# Patient Record
Sex: Male | Born: 1960 | Race: White | Hispanic: No | Marital: Married | Smoking: Former smoker
Health system: Southern US, Community
[De-identification: ages and names within clinical notes are randomized; demographics above are authoritative.]

## PROBLEM LIST (undated history)

## (undated) DIAGNOSIS — E663 Overweight: Secondary | ICD-10-CM

## (undated) DIAGNOSIS — I340 Nonrheumatic mitral (valve) insufficiency: Secondary | ICD-10-CM

## (undated) DIAGNOSIS — E785 Hyperlipidemia, unspecified: Secondary | ICD-10-CM

## (undated) DIAGNOSIS — I639 Cerebral infarction, unspecified: Secondary | ICD-10-CM

## (undated) DIAGNOSIS — I10 Essential (primary) hypertension: Secondary | ICD-10-CM

## (undated) DIAGNOSIS — I4891 Unspecified atrial fibrillation: Principal | ICD-10-CM

## (undated) DIAGNOSIS — S6720XA Crushing injury of unspecified hand, initial encounter: Secondary | ICD-10-CM

## (undated) DIAGNOSIS — J302 Other seasonal allergic rhinitis: Secondary | ICD-10-CM

## (undated) DIAGNOSIS — I429 Cardiomyopathy, unspecified: Secondary | ICD-10-CM

## (undated) DIAGNOSIS — M1990 Unspecified osteoarthritis, unspecified site: Secondary | ICD-10-CM

## (undated) DIAGNOSIS — R945 Abnormal results of liver function studies: Secondary | ICD-10-CM

## (undated) DIAGNOSIS — M7701 Medial epicondylitis, right elbow: Secondary | ICD-10-CM

## (undated) DIAGNOSIS — M79645 Pain in left finger(s): Secondary | ICD-10-CM

## (undated) DIAGNOSIS — K219 Gastro-esophageal reflux disease without esophagitis: Secondary | ICD-10-CM

## (undated) DIAGNOSIS — Z0000 Encounter for general adult medical examination without abnormal findings: Secondary | ICD-10-CM

## (undated) DIAGNOSIS — J449 Chronic obstructive pulmonary disease, unspecified: Secondary | ICD-10-CM

## (undated) DIAGNOSIS — F1010 Alcohol abuse, uncomplicated: Secondary | ICD-10-CM

## (undated) DIAGNOSIS — K429 Umbilical hernia without obstruction or gangrene: Secondary | ICD-10-CM

## (undated) HISTORY — DX: Cerebral infarction, unspecified: I63.9

## (undated) HISTORY — DX: Hyperlipidemia, unspecified: E78.5

## (undated) HISTORY — DX: Chronic obstructive pulmonary disease, unspecified: J44.9

## (undated) HISTORY — PX: VASECTOMY: SHX75

## (undated) HISTORY — DX: Encounter for general adult medical examination without abnormal findings: Z00.00

## (undated) HISTORY — DX: Nonrheumatic mitral (valve) insufficiency: I34.0

## (undated) HISTORY — DX: Gastro-esophageal reflux disease without esophagitis: K21.9

## (undated) HISTORY — DX: Abnormal results of liver function studies: R94.5

## (undated) HISTORY — DX: Overweight: E66.3

## (undated) HISTORY — DX: Crushing injury of unspecified hand, initial encounter: S67.20XA

## (undated) HISTORY — DX: Essential (primary) hypertension: I10

## (undated) HISTORY — DX: Medial epicondylitis, right elbow: M77.01

## (undated) HISTORY — PX: CARPAL TUNNEL RELEASE: SHX101

## (undated) HISTORY — DX: Unspecified atrial fibrillation: I48.91

## (undated) HISTORY — DX: Pain in left finger(s): M79.645

## (undated) HISTORY — DX: Umbilical hernia without obstruction or gangrene: K42.9

## (undated) HISTORY — DX: Unspecified osteoarthritis, unspecified site: M19.90

## (undated) HISTORY — PX: OTHER SURGICAL HISTORY: SHX169

---

## 2008-12-28 ENCOUNTER — Encounter: Admission: RE | Admit: 2008-12-28 | Discharge: 2008-12-28 | Payer: Self-pay | Admitting: Family Medicine

## 2011-04-16 ENCOUNTER — Ambulatory Visit (INDEPENDENT_AMBULATORY_CARE_PROVIDER_SITE_OTHER): Payer: Self-pay | Admitting: Family Medicine

## 2011-04-16 ENCOUNTER — Encounter: Payer: Self-pay | Admitting: Family Medicine

## 2011-04-16 DIAGNOSIS — M129 Arthropathy, unspecified: Secondary | ICD-10-CM

## 2011-04-16 DIAGNOSIS — M1990 Unspecified osteoarthritis, unspecified site: Secondary | ICD-10-CM

## 2011-04-16 DIAGNOSIS — F17200 Nicotine dependence, unspecified, uncomplicated: Secondary | ICD-10-CM

## 2011-04-16 DIAGNOSIS — E663 Overweight: Secondary | ICD-10-CM

## 2011-04-16 DIAGNOSIS — I10 Essential (primary) hypertension: Secondary | ICD-10-CM

## 2011-04-16 DIAGNOSIS — J209 Acute bronchitis, unspecified: Secondary | ICD-10-CM

## 2011-04-16 DIAGNOSIS — E559 Vitamin D deficiency, unspecified: Secondary | ICD-10-CM

## 2011-04-16 DIAGNOSIS — Z720 Tobacco use: Secondary | ICD-10-CM

## 2011-04-16 DIAGNOSIS — K219 Gastro-esophageal reflux disease without esophagitis: Secondary | ICD-10-CM

## 2011-04-16 MED ORDER — RANITIDINE HCL 300 MG PO CAPS: 300.0000 mg | ORAL_CAPSULE | Freq: Every day | ORAL | Status: DC

## 2011-04-16 MED ORDER — LOSARTAN POTASSIUM 50 MG PO TABS: 50.0000 mg | ORAL_TABLET | Freq: Every day | ORAL | Status: DC

## 2011-04-16 MED ORDER — AZITHROMYCIN 250 MG PO TABS: 250.0000 mg | ORAL_TABLET | ORAL | Status: DC

## 2011-04-16 MED ORDER — OMEPRAZOLE 40 MG PO CPDR: 40.0000 mg | DELAYED_RELEASE_CAPSULE | Freq: Every day | ORAL | Status: DC

## 2011-04-16 MED ORDER — ALBUTEROL SULFATE (2.5 MG/3ML) 0.083% IN NEBU: 2.5000 mg | INHALATION_SOLUTION | Freq: Four times a day (QID) | RESPIRATORY_TRACT | Status: DC | PRN

## 2011-04-16 MED ORDER — NIACIN 500 MG PO CPCR: 500.0000 mg | ORAL_CAPSULE | Freq: Every day | ORAL | Status: DC

## 2011-04-16 MED ORDER — ALBUTEROL SULFATE HFA 108 (90 BASE) MCG/ACT IN AERS: 2.0000 | INHALATION_SPRAY | RESPIRATORY_TRACT | Status: DC | PRN

## 2011-04-16 MED ORDER — NAPROXEN-ESOMEPRAZOLE 500-20 MG PO TBEC: 1.0000 | DELAYED_RELEASE_TABLET | Freq: Every day | ORAL | Status: DC | PRN

## 2011-04-17 ENCOUNTER — Encounter: Payer: Self-pay | Admitting: Family Medicine

## 2011-04-17 DIAGNOSIS — I10 Essential (primary) hypertension: Secondary | ICD-10-CM | POA: Insufficient documentation

## 2011-04-17 DIAGNOSIS — F17201 Nicotine dependence, unspecified, in remission: Secondary | ICD-10-CM | POA: Insufficient documentation

## 2011-04-17 DIAGNOSIS — J209 Acute bronchitis, unspecified: Secondary | ICD-10-CM | POA: Insufficient documentation

## 2011-04-17 DIAGNOSIS — M1990 Unspecified osteoarthritis, unspecified site: Secondary | ICD-10-CM | POA: Insufficient documentation

## 2011-04-17 DIAGNOSIS — E663 Overweight: Secondary | ICD-10-CM | POA: Insufficient documentation

## 2011-04-17 DIAGNOSIS — E559 Vitamin D deficiency, unspecified: Secondary | ICD-10-CM | POA: Insufficient documentation

## 2011-04-17 NOTE — Assessment & Plan Note (Signed)
Patient has cut down on his Vimovo but if he skips his dose for greater than 3 days the reflux returns. Encouraged him to try Ranitidine first and if no response then add Omeprazole and avoid offending foods

## 2011-04-17 NOTE — Assessment & Plan Note (Signed)
Had been taking Vimovo with Naproxen but actually reports his right arm pain is better and his knee is tolerable, is encouraged to stop the Naproxen portion for now and to try Aspercreme topically prn, may return to Naproxen as needed with food.

## 2011-04-17 NOTE — Assessment & Plan Note (Signed)
Azithromycin is prescribed and patient will continue inhaled meds and check spirometry at next visit to establish his respiratory status
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
## 2011-04-17 NOTE — Assessment & Plan Note (Signed)
Patient encouraged complete cessation and educated regarding risk of continuing even one cigarette a day

## 2011-04-17 NOTE — Assessment & Plan Note (Signed)
Well treated on current meds, no changes

## 2011-04-17 NOTE — Assessment & Plan Note (Signed)
Avoid trans fats, increase activity levels, eat small, frequent meals

## 2011-05-07 ENCOUNTER — Telehealth: Payer: Self-pay | Admitting: Family Medicine

## 2011-05-10 ENCOUNTER — Telehealth: Payer: Self-pay | Admitting: Family Medicine

## 2011-05-10 MED ORDER — CIPROFLOXACIN HCL 500 MG PO TABS: 500.0000 mg | ORAL_TABLET | Freq: Two times a day (BID) | ORAL | Status: DC

## 2011-05-10 NOTE — Telephone Encounter (Signed)
MD gave pt a sample of flovent for pts wife to pick up

## 2011-05-10 NOTE — Telephone Encounter (Signed)
Patient is needing Flovent refill . Can he take something else other than the Z pack. Reports are saying he shouldn't take Z pack because of his blood pressure.

## 2011-05-10 NOTE — Telephone Encounter (Signed)
So he needs the Ventolin to use prn and then if he needs Flovent what dose did he take and how many puffs

## 2011-05-10 NOTE — Telephone Encounter (Signed)
OK to refill flovent with same sig. He can have Ciprofloxacin 500mg  po bid x 7 days, the reports on Zpack have more to do with abnormal rhythms than BP as I follow them but I do not object to him switching if he would like.

## 2011-05-16 NOTE — Telephone Encounter (Signed)
Phone note in error °

## 2011-06-07 ENCOUNTER — Ambulatory Visit: Payer: PRIVATE HEALTH INSURANCE | Admitting: Family Medicine

## 2011-06-14 ENCOUNTER — Ambulatory Visit: Payer: PRIVATE HEALTH INSURANCE | Admitting: Family Medicine

## 2011-06-14 ENCOUNTER — Encounter: Payer: PRIVATE HEALTH INSURANCE | Admitting: Family Medicine

## 2011-06-19 ENCOUNTER — Telehealth: Payer: Self-pay | Admitting: Family Medicine

## 2011-06-19 NOTE — Telephone Encounter (Signed)
Entered in error

## 2011-06-20 ENCOUNTER — Ambulatory Visit (INDEPENDENT_AMBULATORY_CARE_PROVIDER_SITE_OTHER): Payer: PRIVATE HEALTH INSURANCE | Admitting: Family Medicine

## 2011-06-20 VITALS — BP 122/98 | HR 76 | Temp 97.8°F | Ht 75.75 in

## 2011-06-20 DIAGNOSIS — M79609 Pain in unspecified limb: Secondary | ICD-10-CM

## 2011-06-21 NOTE — Progress Notes (Signed)
Patient left without being seen by doctor, secondary to he had an orthopaedic appt already scheduled for 6/28

## 2011-06-24 ENCOUNTER — Telehealth: Payer: Self-pay | Admitting: *Deleted

## 2011-06-24 ENCOUNTER — Other Ambulatory Visit: Payer: Self-pay | Admitting: Family Medicine

## 2011-06-24 MED ORDER — HYDROCODONE-ACETAMINOPHEN 5-500 MG PO TABS: ORAL_TABLET | ORAL | Status: DC

## 2011-06-24 NOTE — Telephone Encounter (Signed)
RC to pt.  Advised RX has been sent to pharm.  Pt states UC was in [facility] (?[facility]).  He does not remember the name of the orthopedist he saw in [place].  He will have his wife call us with that information.

## 2011-06-24 NOTE — Telephone Encounter (Signed)
Please refer to orthopedist. Get as much info on the UC where he was seen to old records can be obtained. I'll do rx for #30 vicodin 5/500, no RF.  --PM

## 2011-06-24 NOTE — Telephone Encounter (Signed)
Pt broke hand on 6/24.  Pt has seen orthopedist and was not casted, but allowed to continue wearing brace.  Pt was originally seen by UC (out of town) and given Vicodin 5-500.  Pt is taking about 2 1/2 tabs daily, usually cutting tab in half.  Pt is also taking Ibuprofen 200mg , 2 q 4 hours for pain and inflammation.  Pt was told by ortho to follow up with ortho or PCP.  Pt is back to work this week on light duty.  He is out of town today. Pt would like refill on vicodin.  Do I need to refer him back to orthopedist?

## 2011-06-28 ENCOUNTER — Other Ambulatory Visit: Payer: Self-pay | Admitting: *Deleted

## 2011-06-28 DIAGNOSIS — I10 Essential (primary) hypertension: Secondary | ICD-10-CM

## 2011-06-28 MED ORDER — LOSARTAN POTASSIUM 50 MG PO TABS: 50.0000 mg | ORAL_TABLET | Freq: Every day | ORAL | Status: DC

## 2011-06-28 NOTE — Telephone Encounter (Signed)
Faxed request received for refill.  30 day supply sent.  Pt has CPE on 07/05/11

## 2011-07-04 ENCOUNTER — Encounter: Payer: Self-pay | Admitting: Family Medicine

## 2011-07-05 ENCOUNTER — Encounter: Payer: Self-pay | Admitting: Family Medicine

## 2011-07-05 ENCOUNTER — Ambulatory Visit (INDEPENDENT_AMBULATORY_CARE_PROVIDER_SITE_OTHER): Payer: PRIVATE HEALTH INSURANCE | Admitting: Family Medicine

## 2011-07-05 VITALS — BP 145/88 | HR 87 | Temp 98.3°F | Ht 75.75 in | Wt 226.8 lb

## 2011-07-05 DIAGNOSIS — S66912A Strain of unspecified muscle, fascia and tendon at wrist and hand level, left hand, initial encounter: Secondary | ICD-10-CM

## 2011-07-05 DIAGNOSIS — K429 Umbilical hernia without obstruction or gangrene: Secondary | ICD-10-CM | POA: Insufficient documentation

## 2011-07-05 DIAGNOSIS — S6720XA Crushing injury of unspecified hand, initial encounter: Secondary | ICD-10-CM

## 2011-07-05 DIAGNOSIS — S6390XA Sprain of unspecified part of unspecified wrist and hand, initial encounter: Secondary | ICD-10-CM

## 2011-07-05 DIAGNOSIS — I10 Essential (primary) hypertension: Secondary | ICD-10-CM

## 2011-07-05 DIAGNOSIS — J209 Acute bronchitis, unspecified: Secondary | ICD-10-CM

## 2011-07-05 DIAGNOSIS — Z720 Tobacco use: Secondary | ICD-10-CM

## 2011-07-05 DIAGNOSIS — K219 Gastro-esophageal reflux disease without esophagitis: Secondary | ICD-10-CM

## 2011-07-05 DIAGNOSIS — F17200 Nicotine dependence, unspecified, uncomplicated: Secondary | ICD-10-CM

## 2011-07-05 HISTORY — DX: Crushing injury of unspecified hand, initial encounter: S67.20XA

## 2011-07-05 HISTORY — DX: Umbilical hernia without obstruction or gangrene: K42.9

## 2011-07-05 MED ORDER — HYDROCODONE-ACETAMINOPHEN 5-325 MG PO TABS: 1.0000 | ORAL_TABLET | Freq: Three times a day (TID) | ORAL | Status: DC | PRN

## 2011-07-05 MED ORDER — ESOMEPRAZOLE MAGNESIUM 40 MG PO CPDR: 40.0000 mg | DELAYED_RELEASE_CAPSULE | Freq: Every day | ORAL | Status: DC

## 2011-07-05 NOTE — Patient Instructions (Signed)
Hand Injuries You have an injured hand.  Minor fractures, sprains, bruises and burns of the hand are often managed in much the same way. Treatment includes:  Keep your hand elevated above the level of your heart for the next few days until the pain and swelling improve.   Hand dressings and splints are used to reduce motion, relieve pain and prevent re-injury. Do not remove your dressing and splint until your doctor approves.   Apply ice packs for 20-30 minutes every few hours for 2-3 days to reduce pain and swelling due to fractures, sprains, and deep bruises.   Medicine to reduce pain and inflammation is often helpful.  Early motion exercises are sometimes needed to reduce joint stiffness after a hand injury; however, do not use your hand for any activities that increase pain.  Please see your doctor for follow-up care as advised. Document Released: 01/16/2005 Document Re-Released: 03/05/2010 [facility] Patient Information 2011 [place], [place].   Should remain light duty til seen again, next Friday  May continue to ice and try Aspercreme to area up to 3 x day.

## 2011-07-05 NOTE — Assessment & Plan Note (Signed)
Patient is in today for follow up and did not take his antihypertensive today. He agrees to take his medication prior to his visit next week so we can assess its efficacy

## 2011-07-05 NOTE — Progress Notes (Signed)
Marc Hansen [id number] 08-19-1961 07/05/2011      Progress Note-Follow Up  Subjective  Chief Complaint  Chief Complaint  Patient presents with  . Annual Exam  . Hernia    hiatal hernia- above belly button X 2 years    HPI  Patient is a 50 year old Caucasian male in today for followup. He suffered a crush injury to his left hand in late June while working with a heavy mail it in his garden. Initially when seen in urgent care he was told it was a hairline fracture but on review of the x-ray he was told in fact it was more of a crush injury with a sprain and possible nerve damage. Initially the pain was 10 out of 10 but is now dropped to 5/10. The swelling is decreased. He is wearing a splint regularly and finds that helpful. He does ice it regularly also finds that somewhat helpful. He is presently maintained on light duty secondary to having a very strenuous job. He believes he needs at least another week of light duty due to the heavy duty nature of his job but is slowly improving. He was concern of an umbilical hernia that he's had for years. This is reducible on and is not growing. No constipation, nausea vomiting, anorexia, diarrhea or other GI complaints. No recent febrile illness. His cough is still present but greatly improved. Is not having any shortness of breath or wheezing. He never did take his last course of antibiotics due to financial concerns and improved symptoms. No significant congestion. His heartburn is well controlled on Nexium products but does not respond to Prilosec or H2 blockers. He is requesting a small refill on his pain medications as ischemic hand continues to heal  Past Medical History  Diagnosis Date  . Allergy     seasonal  . GERD (gastroesophageal reflux disease)   . Hypertension   . Overweight 04/17/2011  . Arthritis 04/17/2011  . Reflux 04/17/2011  . HTN (hypertension) 04/17/2011  . Tobacco abuse 04/17/2011  . Acute bronchitis 04/17/2011  . Vitamin D  deficiency 04/17/2011  . Hand crush injury 07/05/2011  . Umbilical hernia 07/05/2011    Past Surgical History  Procedure Date  . Vasectomy   . Knee surgery   . Carpal tunnel release     right, needed some nerve repair as well  . Eye surgery     left for detachmenet  . Right knee arthroscopy     Family History  Problem Relation Age of Onset  . Cancer Mother 16    breast- remission  . Fibromyalgia Mother   . Other Father     CHF- stent   . Heart attack Father   . Heart disease Maternal Uncle   . Heart disease Maternal Grandmother   . Heart disease Maternal Grandfather     History   Social History  . Marital Status: Married    Spouse Name: N/A    Number of Children: N/A  . Years of Education: N/A   Occupational History  . Not on file.   Social History Main Topics  . Smoking status: Current Everyday Smoker -- 0.3 packs/day    Types: Cigarettes  . Smokeless tobacco: Never Used  . Alcohol Use: Yes     24 beers weekly  . Drug Use: No  . Sexually Active: Yes -- Male partner(s)   Other Topics Concern  . Not on file   Social History Narrative  . No narrative on file  Current Outpatient Prescriptions on File Prior to Visit  Medication Sig Dispense Refill  . albuterol (PROVENTIL HFA;VENTOLIN HFA) 108 (90 BASE) MCG/ACT inhaler Inhale 2 puffs into the lungs as needed.  1 Inhaler  2  . albuterol (PROVENTIL) (2.5 MG/3ML) 0.083% nebulizer solution Take 3 mLs (2.5 mg total) by nebulization every 6 (six) hours as needed.  100 vial  2  . fish oil-omega-3 fatty acids 1000 MG capsule Take 2 g by mouth 2 (two) times daily.        . Fluticasone Propionate, Inhal, (FLOVENT DISKUS) 100 MCG/BLIST AEPB Inhale into the lungs.        Marland Kitchen ibuprofen (ADVIL,MOTRIN) 200 MG tablet Take 400 mg by mouth every 6 (six) hours as needed.        Marland Kitchen losartan (COZAAR) 50 MG tablet Take 1 tablet (50 mg total) by mouth daily.  30 tablet  0  . multivitamin (THERAGRAN) per tablet Take 1 tablet by mouth  daily.        . Naproxen-Esomeprazole (VIMOVO) 500-20 MG TBEC Take 1 tablet by mouth daily as needed.  30 tablet  2  . Nebulizer MISC by Does not apply route.        . niacin 500 MG CR capsule Take 1 capsule (500 mg total) by mouth daily. Take bid   30 capsule  2  . omeprazole (PRILOSEC) 40 MG capsule Take 1 capsule (40 mg total) by mouth daily. May alternate with ranitidine  30 capsule  2  . ranitidine (ZANTAC) 300 MG capsule Take 1 capsule (300 mg total) by mouth daily.  30 capsule  2  . Red Yeast Rice Extract (RED YEAST RICE PO) Take 2 tablets by mouth 2 (two) times daily.        Marland Kitchen VITAMIN D, ERGOCALCIFEROL, PO Take 1,200 mg by mouth daily.          No Known Allergies  Review of Systems  Review of Systems  Constitutional: Negative for fever and malaise/fatigue.  HENT: Negative for congestion.   Eyes: Negative for discharge.  Respiratory: Positive for cough. Negative for shortness of breath.   Cardiovascular: Negative for chest pain, palpitations and leg swelling.  Gastrointestinal: Positive for heartburn. Negative for nausea, abdominal pain, diarrhea, blood in stool and melena.  Genitourinary: Negative for dysuria.  Musculoskeletal: Positive for joint pain. Negative for myalgias and falls.       Left hand tender at  Base of thumb, mildly swollen  Skin: Negative for rash.  Neurological: Negative for loss of consciousness and headaches.  Endo/Heme/Allergies: Negative for polydipsia.  Psychiatric/Behavioral: Negative for depression and suicidal ideas. The patient is not nervous/anxious and does not have insomnia.     Objective  BP 145/88  Pulse 87  Temp(Src) 98.3 F (36.8 C) (Oral)  Ht 6' 3.75" (1.924 m)  Wt 226 lb 12.8 oz (102.876 kg)  BMI 27.79 kg/m2  SpO2 97%  Physical Exam  Physical Exam  Constitutional: He is oriented to person, place, and time and well-developed, well-nourished, and in no distress. No distress.  HENT:  Head: Normocephalic and atraumatic.  Eyes:  Conjunctivae are normal.  Neck: Neck supple. No thyromegaly present.  Cardiovascular: Normal rate, regular rhythm and normal heart sounds.   No murmur heard. Pulmonary/Chest: Effort normal and breath sounds normal. No respiratory distress.  Abdominal: He exhibits no distension and no mass. There is no tenderness.  Musculoskeletal: He exhibits no edema.       Mild swelling and tender to palp at  base of left thumb. FROM in hand, sensation and circulation intact  Neurological: He is alert and oriented to person, place, and time.  Skin: Skin is warm.  Psychiatric: Memory, affect and judgment normal.    No results found for this basename: TSH   No results found for this basename: WBC, HGB, HCT, MCV, PLT   No results found for this basename: CREATININE, BUN, NA, K, CL, CO2   No results found for this basename: ALT, AST, GGT, ALKPHOS, BILITOT   No results found for this basename: CHOL   No results found for this basename: HDL   No results found for this basename: LDLCALC   No results found for this basename: TRIG   No results found for this basename: CHOLHDL     Assessment & Plan  HTN (hypertension) Patient is in today for follow up and did not take his antihypertensive today. He agrees to take his medication prior to his visit next week so we can assess its efficacy  Reflux Patient notes Omeprazole and H2 blockers have been tried and do not control his symptoms even when he takes them every day. He can take the Nexium or his Vimovo just every 3rd day and these control his symptoms. We will send in some Esomeprazole and he cantake that and generic Naproxen prn as needed  Tobacco abuse Smoking very little at this time, usually less than 1/2 pack per day. Encouraged again to try and completely cease all use  Hand crush injury Patient injured his hand with a heavy mallet while working in his garden. He was struggling with 10/10 pain over the base of his left thumb so he was seen in  Urgent care. Initially he was told there was a hairline fracture but on repeat they decided it was not actually a fracture but instead just a traumatic crush injury. With some possible nerve damage. The pain is slowly improving as is the swelling but still persists. He rates the pain at 5/10 at this time. He is given a refill on some Norco use sparingly. He is asked to ice it and apply Aspercreme to the region roughly 3 times daily he may continue to splint the injury as well. He is kept on light duty at work for the next week and we will see him in followup to release him to work at the end of next week if his symptoms continue to improve.  Acute bronchitis Resolved, patient never did take the antibiotics we prescribed at the last visit secondary to his finances and his symptoms improving  Umbilical hernia Small, reducible, nontender. We will monitor unless symptoms worsen or patient decides he would like a surgical referral for futher evaluation

## 2011-07-05 NOTE — Assessment & Plan Note (Signed)
Small, reducible, nontender. We will monitor unless symptoms worsen or patient decides he would like a surgical referral for futher evaluation

## 2011-07-05 NOTE — Assessment & Plan Note (Signed)
Patient notes Omeprazole and H2 blockers have been tried and do not control his symptoms even when he takes them every day. He can take the Nexium or his Vimovo just every 3rd day and these control his symptoms. We will send in some Esomeprazole and he cantake that and generic Naproxen prn as needed

## 2011-07-05 NOTE — Assessment & Plan Note (Signed)
Smoking very little at this time, usually less than 1/2 pack per day. Encouraged again to try and completely cease all use

## 2011-07-05 NOTE — Assessment & Plan Note (Signed)
Resolved, patient never did take the antibiotics we prescribed at the last visit secondary to his finances and his symptoms improving

## 2011-07-05 NOTE — Assessment & Plan Note (Signed)
Patient injured his hand with a heavy mallet while working in his garden. He was struggling with 10/10 pain over the base of his left thumb so he was seen in Urgent care. Initially he was told there was a hairline fracture but on repeat they decided it was not actually a fracture but instead just a traumatic crush injury. With some possible nerve damage. The pain is slowly improving as is the swelling but still persists. He rates the pain at 5/10 at this time. He is given a refill on some Norco use sparingly. He is asked to ice it and apply Aspercreme to the region roughly 3 times daily he may continue to splint the injury as well. He is kept on light duty at work for the next week and we will see him in followup to release him to work at the end of next week if his symptoms continue to improve.

## 2011-07-12 ENCOUNTER — Encounter: Payer: Self-pay | Admitting: Family Medicine

## 2011-07-12 ENCOUNTER — Ambulatory Visit (INDEPENDENT_AMBULATORY_CARE_PROVIDER_SITE_OTHER): Payer: PRIVATE HEALTH INSURANCE | Admitting: Family Medicine

## 2011-07-12 DIAGNOSIS — Z889 Allergy status to unspecified drugs, medicaments and biological substances status: Secondary | ICD-10-CM

## 2011-07-12 DIAGNOSIS — Z720 Tobacco use: Secondary | ICD-10-CM

## 2011-07-12 DIAGNOSIS — S6720XA Crushing injury of unspecified hand, initial encounter: Secondary | ICD-10-CM

## 2011-07-12 DIAGNOSIS — Z0000 Encounter for general adult medical examination without abnormal findings: Secondary | ICD-10-CM

## 2011-07-12 DIAGNOSIS — Z9109 Other allergy status, other than to drugs and biological substances: Secondary | ICD-10-CM

## 2011-07-12 DIAGNOSIS — IMO0001 Reserved for inherently not codable concepts without codable children: Secondary | ICD-10-CM

## 2011-07-12 DIAGNOSIS — F17200 Nicotine dependence, unspecified, uncomplicated: Secondary | ICD-10-CM

## 2011-07-12 DIAGNOSIS — J209 Acute bronchitis, unspecified: Secondary | ICD-10-CM

## 2011-07-12 DIAGNOSIS — H539 Unspecified visual disturbance: Secondary | ICD-10-CM

## 2011-07-12 DIAGNOSIS — I10 Essential (primary) hypertension: Secondary | ICD-10-CM

## 2011-07-12 DIAGNOSIS — K219 Gastro-esophageal reflux disease without esophagitis: Secondary | ICD-10-CM

## 2011-07-12 DIAGNOSIS — K429 Umbilical hernia without obstruction or gangrene: Secondary | ICD-10-CM

## 2011-07-12 DIAGNOSIS — E785 Hyperlipidemia, unspecified: Secondary | ICD-10-CM

## 2011-07-12 LAB — LIPID PANEL
HDL: 42.5 mg/dL (ref >39.00)
Triglycerides: 162 mg/dL — ABNORMAL HIGH (ref 0.0–149.0)
VLDL: 32.4 mg/dL (ref 0.0–40.0)

## 2011-07-12 LAB — RENAL FUNCTION PANEL
Chloride: 105 mEq/L (ref 96–112)
GFR: 84.71 mL/min (ref >60.00)
Glucose, Bld: 104 mg/dL — ABNORMAL HIGH (ref 70–99)
Phosphorus: 2.8 mg/dL (ref 2.3–4.6)
Potassium: 4.4 mEq/L (ref 3.5–5.1)
Sodium: 139 mEq/L (ref 135–145)

## 2011-07-12 LAB — LDL CHOLESTEROL, DIRECT: Direct LDL: 152.8 mg/dL

## 2011-07-12 LAB — CBC WITH DIFFERENTIAL/PLATELET
Basophils Absolute: 0 10*3/uL (ref 0.0–0.1)
Eosinophils Absolute: 0.3 10*3/uL (ref 0.0–0.7)
Lymphocytes Relative: 37.2 % (ref 12.0–46.0)
MCHC: 34.1 g/dL (ref 30.0–36.0)
Monocytes Absolute: 0.4 10*3/uL (ref 0.1–1.0)
Neutrophils Relative %: 50.1 % (ref 43.0–77.0)
Platelets: 257 10*3/uL (ref 150.0–400.0)
RBC: 4.51 Mil/uL (ref 4.22–5.81)
RDW: 13 % (ref 11.5–14.6)

## 2011-07-12 LAB — HEPATIC FUNCTION PANEL
Alkaline Phosphatase: 61 U/L (ref 39–117)
Bilirubin, Direct: 0.1 mg/dL (ref 0.0–0.3)

## 2011-07-12 MED ORDER — LORATADINE 10 MG PO TABS: 10.0000 mg | ORAL_TABLET | Freq: Every day | ORAL | Status: DC

## 2011-07-12 NOTE — Patient Instructions (Signed)

## 2011-07-12 NOTE — Assessment & Plan Note (Signed)
Did not take meds them am due to doing blood work today. Numbers acceptable.

## 2011-07-16 ENCOUNTER — Encounter: Payer: Self-pay | Admitting: Family Medicine

## 2011-07-16 DIAGNOSIS — E785 Hyperlipidemia, unspecified: Secondary | ICD-10-CM | POA: Insufficient documentation

## 2011-07-16 DIAGNOSIS — H539 Unspecified visual disturbance: Secondary | ICD-10-CM | POA: Insufficient documentation

## 2011-07-16 LAB — NMR LIPOPROFILE WITHOUT LIPIDS
HDL Particle Number: 30 umol/L — ABNORMAL LOW (ref >=30.5)
LDL Particle Number: 2244 nmol/L — ABNORMAL HIGH (ref <1000)
LP-IR Score: 59 — ABNORMAL HIGH (ref <=45)

## 2011-07-16 NOTE — Assessment & Plan Note (Signed)
Well controlled as long as he takes Lomotil or Nexium. Omeprazole has not been nearly as helpful we will discontinue this she will avoid offending foods and continue with jis Esomeprazole

## 2011-07-16 NOTE — Assessment & Plan Note (Signed)
Encouraged him to avoid trans-fats increase exercise eat small frequent meals with protein to bronchitis. He previously had been taking red yeast rice and fish although has not taken it for the last month and a half he's been taking his niacin only half the time. He agrees to restart these and will reassess in 3-4 months time.

## 2011-07-16 NOTE — Assessment & Plan Note (Signed)
Patient is noting some intermittent floaters in his field of vision more so on the left-hand side comes and goes has no pain associated with it but he describes it as a squiggly line are as noted in the left side without any other associated symptoms. At this point due to the persistence he is asked to proceed with a dilated eye exam if he is having symptoms worsen he will notify our office.

## 2011-07-16 NOTE — Assessment & Plan Note (Signed)
Asymptomatic and small, patient will continue to monitor and notify us if it becomes uncomfortable so we can refer. He is aware that if it ever becomes acutely painful and red he needs to seek immediate medical care

## 2011-07-16 NOTE — Progress Notes (Signed)
Marc Hansen [id number] 03-Feb-1961 07/16/2011      Progress Note-Follow Up  Subjective  Chief Complaint  Chief Complaint  Patient presents with  . Hand Pain    doing better, needs note for work    HPI  Patient is a 50 year old male in today for her annual exam and fasting labs. Overall feels he is doing well. His breathing and cough are improved he never did take his antibiotics. He continues to have reflux symptoms when he takes omeprazole but he takes at home although or Nexium he does well. He continues to smoke but no more than a couple cigarettes a day. No chest pain no palpitations, shortness of breath, GI or GU complaints he has an umbilical hernia but denies any symptomatic issues. No pain warmth. No change. No constipation or diarrhea. He is not taking his rabies Rice or his fish oil for about a month and a half. Take his niacin about roughly only half days of the month. Continues to have visual changes with weekly lives and floaters seen mostly in his left field of vision. He denies any eye pain, headache, trauma or other symptoms associated with these episodes which occur infrequently.  Past Medical History  Diagnosis Date  . Allergy     seasonal  . GERD (gastroesophageal reflux disease)   . Hypertension   . Overweight 04/17/2011  . Arthritis 04/17/2011  . Reflux 04/17/2011  . HTN (hypertension) 04/17/2011  . Tobacco abuse 04/17/2011  . Acute bronchitis 04/17/2011  . Vitamin D deficiency 04/17/2011  . Hand crush injury 07/05/2011  . Umbilical hernia 07/05/2011    Past Surgical History  Procedure Date  . Vasectomy   . Knee surgery   . Carpal tunnel release     right, needed some nerve repair as well  . Eye surgery     left for detachmenet  . Right knee arthroscopy     Family History  Problem Relation Age of Onset  . Cancer Mother 21    breast- remission  . Fibromyalgia Mother   . Other Father     CHF- stent   . Heart attack Father   . Heart disease Maternal  Uncle   . Heart disease Maternal Grandmother   . Heart disease Maternal Grandfather     History   Social History  . Marital Status: Married    Spouse Name: N/A    Number of Children: N/A  . Years of Education: N/A   Occupational History  . Not on file.   Social History Main Topics  . Smoking status: Current Everyday Smoker -- 0.3 packs/day    Types: Cigarettes  . Smokeless tobacco: Never Used  . Alcohol Use: Yes     24 beers weekly  . Drug Use: No  . Sexually Active: Yes -- Male partner(s)   Other Topics Concern  . Not on file   Social History Narrative  . No narrative on file    Current Outpatient Prescriptions on File Prior to Visit  Medication Sig Dispense Refill  . albuterol (PROVENTIL HFA;VENTOLIN HFA) 108 (90 BASE) MCG/ACT inhaler Inhale 2 puffs into the lungs as needed.  1 Inhaler  2  . albuterol (PROVENTIL) (2.5 MG/3ML) 0.083% nebulizer solution Take 3 mLs (2.5 mg total) by nebulization every 6 (six) hours as needed.  100 vial  2  . esomeprazole (NEXIUM) 40 MG capsule Take 1 capsule (40 mg total) by mouth daily.  30 capsule  5  . fish oil-omega-3 fatty acids 1000  MG capsule Take 2 g by mouth 2 (two) times daily.        . Fluticasone Propionate, Inhal, (FLOVENT DISKUS) 100 MCG/BLIST AEPB Inhale into the lungs.        Marland Kitchen HYDROcodone-acetaminophen (NORCO) 5-325 MG per tablet Take 1 tablet by mouth every 8 (eight) hours as needed for pain.  60 tablet  0  . losartan (COZAAR) 50 MG tablet Take 1 tablet (50 mg total) by mouth daily.  30 tablet  0  . multivitamin (THERAGRAN) per tablet Take 1 tablet by mouth daily.        . Naproxen-Esomeprazole (VIMOVO) 500-20 MG TBEC Take 1 tablet by mouth daily as needed.  30 tablet  2  . Nebulizer MISC by Does not apply route.        . niacin 500 MG CR capsule Take 1 capsule (500 mg total) by mouth daily. Take bid   30 capsule  2  . ranitidine (ZANTAC) 300 MG capsule Take 1 capsule (300 mg total) by mouth daily.  30 capsule  2  .  Red Yeast Rice Extract (RED YEAST RICE PO) Take 2 tablets by mouth 2 (two) times daily.        Marland Kitchen VITAMIN D, ERGOCALCIFEROL, PO Take 1,200 mg by mouth daily.          No Known Allergies  Review of Systems  Review of Systems  Constitutional: Negative for fever and malaise/fatigue.  HENT: Negative for congestion.   Eyes: Negative for discharge.  Respiratory: Negative for shortness of breath.   Cardiovascular: Negative for chest pain, palpitations and leg swelling.  Gastrointestinal: Negative for nausea, abdominal pain and diarrhea.  Genitourinary: Negative for dysuria.  Musculoskeletal: Negative for falls.  Skin: Negative for rash.  Neurological: Negative for loss of consciousness and headaches.  Endo/Heme/Allergies: Negative for polydipsia.  Psychiatric/Behavioral: Negative for depression and suicidal ideas. The patient is not nervous/anxious and does not have insomnia.     Objective  BP 134/87  Pulse 73  Ht 6' 3.75" (1.924 m)  Wt 229 lb (103.874 kg)  BMI 28.06 kg/m2  SpO2 98%  Physical Exam  Physical Exam  Constitutional: He is oriented to person, place, and time and well-developed, well-nourished, and in no distress. No distress.  HENT:  Head: Normocephalic and atraumatic.  Right Ear: External ear normal.  Left Ear: External ear normal.  Mouth/Throat: Oropharynx is clear and moist. No oropharyngeal exudate.  Eyes: Conjunctivae and EOM are normal. Right eye exhibits no discharge. Left eye exhibits no discharge. No scleral icterus.  Neck: Neck supple. No thyromegaly present.  Cardiovascular: Normal rate, regular rhythm and normal heart sounds.   No murmur heard. Pulmonary/Chest: Effort normal and breath sounds normal. No respiratory distress.  Abdominal: Soft. Bowel sounds are normal. He exhibits no distension and no mass. There is no rebound and no guarding.       Small umbilical hernia  Musculoskeletal: He exhibits no edema.  Lymphadenopathy:    He has no cervical  adenopathy.  Neurological: He is alert and oriented to person, place, and time.  Skin: Skin is warm.  Psychiatric: Memory and judgment normal.    Lab Results  Component Value Date   TSH 0.59 07/12/2011   Lab Results  Component Value Date   WBC 5.9 07/12/2011   HGB 13.9 07/12/2011   HCT 40.8 07/12/2011   MCV 90.5 07/12/2011   PLT 257.0 07/12/2011   Lab Results  Component Value Date   CREATININE 1.0 07/12/2011  BUN 20 07/12/2011   NA 139 07/12/2011   K 4.4 07/12/2011   CL 105 07/12/2011   CO2 27 07/12/2011   Lab Results  Component Value Date   ALT 44 07/12/2011   AST 35 07/12/2011   ALKPHOS 61 07/12/2011   BILITOT 0.2* 07/12/2011   Lab Results  Component Value Date   CHOL 203* 07/12/2011   Lab Results  Component Value Date   HDL 42.50 07/12/2011   No results found for this basename: [facility]   Lab Results  Component Value Date   TRIG 162.0* 07/12/2011   Lab Results  Component Value Date   CHOLHDL 5 07/12/2011     Assessment & Plan  HTN (hypertension) Did not take meds them am due to doing blood work today. Numbers acceptable.  Acute bronchitis Resolved, no recent cough, fevers.  Tobacco abuse Patient counseled for greater than 3 minutes regarding the need to quit smoking altogether. He expresses understanding he is down to just a few a day. Will continue to try quitting  Umbilical hernia Asymptomatic and small, patient will continue to monitor and notify us if it becomes uncomfortable so we can refer. He is aware that if it ever becomes acutely painful and red he needs to seek immediate medical care  Reflux Well controlled as long as he takes Lomotil or Nexium. Omeprazole has not been nearly as helpful we will discontinue this she will avoid offending foods and continue with jis Esomeprazole  Hand crush injury Pain and function are improving daily he agrees he would like to be released to work note is provided at this time  Hyperlipidemia Encouraged him to avoid  trans-fats increase exercise eat small frequent meals with protein to bronchitis. He previously had been taking red yeast rice and fish although has not taken it for the last month and a half he's been taking his niacin only half the time. He agrees to restart these and will reassess in 3-4 months time.  Visual changes Patient is noting some intermittent floaters in his field of vision more so on the left-hand side comes and goes has no pain associated with it but he describes it as a squiggly line are as noted in the left side without any other associated symptoms. At this point due to the persistence he is asked to proceed with a dilated eye exam if he is having symptoms worsen he will notify our office.

## 2011-07-16 NOTE — Assessment & Plan Note (Signed)
Patient counseled for greater than 3 minutes regarding the need to quit smoking altogether. He expresses understanding he is down to just a few a day. Will continue to try quitting

## 2011-07-16 NOTE — Assessment & Plan Note (Signed)
Resolved, no recent cough, fevers.

## 2011-07-16 NOTE — Assessment & Plan Note (Signed)
Pain and function are improving daily he agrees he would like to be released to work note is provided at this time

## 2011-07-17 ENCOUNTER — Other Ambulatory Visit: Payer: Self-pay

## 2011-07-17 NOTE — Progress Notes (Signed)
Opened on accident

## 2011-07-18 ENCOUNTER — Telehealth: Payer: Self-pay | Admitting: Family Medicine

## 2011-07-18 NOTE — Telephone Encounter (Signed)
I see in his chart that omeprazole and zantac were ineffective in the past. His options are: buy OTC prevacid and take daily, although this is brand only, no generic. He could also try pepcid or pepcid complete, both are OTC and should both have generics.  --PM

## 2011-07-18 NOTE — Telephone Encounter (Signed)
Please advise 

## 2011-07-18 NOTE — Telephone Encounter (Signed)
Patient's wife can't pay the co pay for the Nexium until August 15 th and wants to know what can her husband use until then.

## 2011-07-19 NOTE — Telephone Encounter (Signed)
Left a detailed voicemail.

## 2011-07-24 DIAGNOSIS — Z0000 Encounter for general adult medical examination without abnormal findings: Secondary | ICD-10-CM | POA: Insufficient documentation

## 2011-07-24 NOTE — Assessment & Plan Note (Signed)
Patient is encouraged to quit smoking, obtain 7-8 hours of sleep a night, continue with his daily walking regimen and try to increase as tolerated, may not tolerate rigorous levels of activity due to previous injuries. He wears his seat belt regularly.  He is encouraged to consider the DASH diet with lean proteins, complex carbs and small, frequent meals. Attempt 64 oz of clear fluid intake daily

## 2011-08-08 ENCOUNTER — Other Ambulatory Visit: Payer: Self-pay

## 2011-08-08 DIAGNOSIS — I10 Essential (primary) hypertension: Secondary | ICD-10-CM

## 2011-08-08 MED ORDER — LOSARTAN POTASSIUM 50 MG PO TABS: 50.0000 mg | ORAL_TABLET | Freq: Every day | ORAL | Status: DC

## 2011-09-26 ENCOUNTER — Telehealth: Payer: Self-pay | Admitting: Family Medicine

## 2011-09-26 NOTE — Telephone Encounter (Signed)
Patient needs 07/12/11 bloodwork for NMR to be coded V70 so that insurance will pay. Invoice is from [place] acct [id number] [phone].

## 2011-09-27 NOTE — Telephone Encounter (Signed)
Please print this note and then lets show this to our Supervisor so she can help [place] figure out how to change this

## 2011-09-27 NOTE — Telephone Encounter (Signed)
I gave this to Diane to give to [facility] Monday morning

## 2011-10-01 NOTE — Telephone Encounter (Signed)
Spoke with Karel Jarvis at [place] and she will resubmit the V70.0 diagnosis code change to [facility].

## 2011-10-02 NOTE — Telephone Encounter (Signed)
Patient's wife called to check on the status of the [facility] charge, advised that we contacted [facility] 10/01/11 and gave the new code

## 2011-10-09 ENCOUNTER — Telehealth: Payer: Self-pay | Admitting: Family Medicine

## 2011-10-09 MED ORDER — FLUTICASONE PROPIONATE (INHAL) 100 MCG/BLIST IN AEPB: INHALATION_SPRAY | RESPIRATORY_TRACT | Status: DC

## 2011-10-09 NOTE — Telephone Encounter (Signed)
Please advise 

## 2011-10-09 NOTE — Telephone Encounter (Signed)
I only see where I gave him a Flovent rx not an Advair? He can have what I tried to give him in the past

## 2011-10-09 NOTE — Telephone Encounter (Signed)
Patient did not fill original Rx but would like it now, please contact

## 2011-11-04 ENCOUNTER — Other Ambulatory Visit: Payer: Self-pay

## 2011-11-04 MED ORDER — NAPROXEN-ESOMEPRAZOLE 500-20 MG PO TBEC: 1.0000 | DELAYED_RELEASE_TABLET | Freq: Every day | ORAL | Status: DC | PRN

## 2011-11-06 ENCOUNTER — Other Ambulatory Visit: Payer: Self-pay

## 2011-11-06 MED ORDER — ALBUTEROL SULFATE HFA 108 (90 BASE) MCG/ACT IN AERS: 2.0000 | INHALATION_SPRAY | RESPIRATORY_TRACT | Status: DC | PRN

## 2011-11-26 ENCOUNTER — Other Ambulatory Visit: Payer: Self-pay | Admitting: *Deleted

## 2011-11-26 DIAGNOSIS — I10 Essential (primary) hypertension: Secondary | ICD-10-CM

## 2011-11-26 MED ORDER — LOSARTAN POTASSIUM 50 MG PO TABS: 50.0000 mg | ORAL_TABLET | Freq: Every day | ORAL | Status: DC

## 2011-11-26 NOTE — Telephone Encounter (Signed)
Faxed request received.  Per Dr. Abner Greenspan, OK to fill #[phone] x 3.  RX sent.

## 2012-03-09 ENCOUNTER — Ambulatory Visit (INDEPENDENT_AMBULATORY_CARE_PROVIDER_SITE_OTHER): Payer: PRIVATE HEALTH INSURANCE | Admitting: Family Medicine

## 2012-03-09 ENCOUNTER — Encounter: Payer: Self-pay | Admitting: Family Medicine

## 2012-03-09 VITALS — BP 123/81 | HR 69 | Temp 97.1°F | Ht 75.75 in | Wt 238.8 lb

## 2012-03-09 DIAGNOSIS — J209 Acute bronchitis, unspecified: Secondary | ICD-10-CM

## 2012-03-09 DIAGNOSIS — S6390XA Sprain of unspecified part of unspecified wrist and hand, initial encounter: Secondary | ICD-10-CM

## 2012-03-09 DIAGNOSIS — F17200 Nicotine dependence, unspecified, uncomplicated: Secondary | ICD-10-CM

## 2012-03-09 DIAGNOSIS — R0789 Other chest pain: Secondary | ICD-10-CM

## 2012-03-09 DIAGNOSIS — I10 Essential (primary) hypertension: Secondary | ICD-10-CM

## 2012-03-09 DIAGNOSIS — J40 Bronchitis, not specified as acute or chronic: Secondary | ICD-10-CM

## 2012-03-09 DIAGNOSIS — S66912A Strain of unspecified muscle, fascia and tendon at wrist and hand level, left hand, initial encounter: Secondary | ICD-10-CM

## 2012-03-09 DIAGNOSIS — Z720 Tobacco use: Secondary | ICD-10-CM

## 2012-03-09 MED ORDER — LOSARTAN POTASSIUM 50 MG PO TABS: 50.0000 mg | ORAL_TABLET | Freq: Every day | ORAL | Status: DC

## 2012-03-09 MED ORDER — NITROGLYCERIN 0.4 MG SL SUBL: 0.4000 mg | SUBLINGUAL_TABLET | SUBLINGUAL | Status: DC | PRN

## 2012-03-09 MED ORDER — ASPIRIN 81 MG PO TBEC: 81.0000 mg | DELAYED_RELEASE_TABLET | Freq: Every day | ORAL | Status: DC

## 2012-03-09 MED ORDER — ALBUTEROL SULFATE (2.5 MG/3ML) 0.083% IN NEBU: 2.5000 mg | INHALATION_SOLUTION | Freq: Four times a day (QID) | RESPIRATORY_TRACT | Status: DC | PRN

## 2012-03-09 MED ORDER — HYDROCODONE-ACETAMINOPHEN 5-325 MG PO TABS: 1.0000 | ORAL_TABLET | Freq: Three times a day (TID) | ORAL | Status: DC | PRN

## 2012-03-09 MED ORDER — ALBUTEROL SULFATE HFA 108 (90 BASE) MCG/ACT IN AERS: 2.0000 | INHALATION_SPRAY | RESPIRATORY_TRACT | Status: DC | PRN

## 2012-03-09 NOTE — Patient Instructions (Addendum)
Gastroesophageal Reflux Disease, Adult Gastroesophageal reflux disease (GERD) happens when acid from your stomach flows up into the esophagus. When acid comes in contact with the esophagus, the acid causes soreness (inflammation) in the esophagus. Over time, GERD may create small holes (ulcers) in the lining of the esophagus. CAUSES   Increased body weight. This puts pressure on the stomach, making acid rise from the stomach into the esophagus.   Smoking. This increases acid production in the stomach.   Drinking alcohol. This causes decreased pressure in the lower esophageal sphincter (valve or ring of muscle between the esophagus and stomach), allowing acid from the stomach into the esophagus.   Late evening meals and a full stomach. This increases pressure and acid production in the stomach.   A malformed lower esophageal sphincter.  Sometimes, no cause is found. SYMPTOMS   Burning pain in the lower part of the mid-chest behind the breastbone and in the mid-stomach area. This may occur twice a week or more often.   Trouble swallowing.   Sore throat.   Dry cough.   Asthma-like symptoms including chest tightness, shortness of breath, or wheezing.  DIAGNOSIS  Your caregiver may be able to diagnose GERD based on your symptoms. In some cases, X-rays and other tests may be done to check for complications or to check the condition of your stomach and esophagus. TREATMENT  Your caregiver may recommend over-the-counter or prescription medicines to help decrease acid production. Ask your caregiver before starting or adding any new medicines.  HOME CARE INSTRUCTIONS   Change the factors that you can control. Ask your caregiver for guidance concerning weight loss, quitting smoking, and alcohol consumption.   Avoid foods and drinks that make your symptoms worse, such as:   Caffeine or alcoholic drinks.   Chocolate.   Peppermint or mint flavorings.   Garlic and onions.   Spicy foods.     Citrus fruits, such as oranges, lemons, or limes.   Tomato-based foods such as sauce, chili, salsa, and pizza.   Fried and fatty foods.   Avoid lying down for the 3 hours prior to your bedtime or prior to taking a nap.   Eat small, frequent meals instead of large meals.   Wear loose-fitting clothing. Do not wear anything tight around your waist that causes pressure on your stomach.   Raise the head of your bed 6 to 8 inches with wood blocks to help you sleep. Extra pillows will not help.   Only take over-the-counter or prescription medicines for pain, discomfort, or fever as directed by your caregiver.   Do not take aspirin, ibuprofen, or other nonsteroidal anti-inflammatory drugs (NSAIDs).  SEEK IMMEDIATE MEDICAL CARE IF:   You have pain in your arms, neck, jaw, teeth, or back.   Your pain increases or changes in intensity or duration.   You develop nausea, vomiting, or sweating (diaphoresis).   You develop shortness of breath, or you faint.   Your vomit is green, yellow, black, or looks like coffee grounds or blood.   Your stool is red, bloody, or black.  These symptoms could be signs of other problems, such as heart disease, gastric bleeding, or esophageal bleeding. MAKE SURE YOU:   Understand these instructions.   Will watch your condition.   Will get help right away if you are not doing well or get worse.  Document Released: 09/18/2005 Document Revised: 11/28/2011 Document Reviewed: 06/28/2011 [facility] Patient Information 2012 [place], [place].  Remember Aleve=Naproxen, Advil=Motrin=Ibuprofen, all are NSAIDs, so cannot  take both on the same day If you need any breakthrough pain meds after taking above then have to take Tylenol=Acetaminophen, max of 3000mg  daily  Remember Proventil=Ventolin+ProAir

## 2012-03-12 ENCOUNTER — Encounter: Payer: Self-pay | Admitting: Family Medicine

## 2012-03-12 DIAGNOSIS — R0789 Other chest pain: Secondary | ICD-10-CM | POA: Insufficient documentation

## 2012-03-12 NOTE — Assessment & Plan Note (Signed)
Adequately controlled no changes.  

## 2012-03-12 NOTE — Assessment & Plan Note (Signed)
Continues to smoke varying amounts, encouraged complete cessation.

## 2012-03-12 NOTE — Progress Notes (Signed)
Patient ID: Marc Hansen, male   DOB: [birth year], 51 y.o.   MRN: [id number] Marc Hansen [id number] 1961-10-13 03/12/2012      Progress Note-Follow Up  Subjective  Chief Complaint  Chief Complaint  Patient presents with  . Knee Pain    right  . medication refills    HPI  Patient is a 51 year old Caucasian male in today for followup. He is complaining of some right knee pain which is ongoing recently worsening like to start pain medication for this. Has had menorrhagia with heartburn and shortness of breath with exertion is requesting a refill on albuterol. He's had some intermittent axillary and substernal chest pain he describes a sharp. Does not generally have associated symptoms with it. He does note when he does twisting lifting and exertion sometimes the pain occurs no headache, palpitations. Did have a stress test about 5 years ago. Describes most of his chest pains as sharp and shooting with movement.   Past Medical History  Diagnosis Date  . Allergy     seasonal  . GERD (gastroesophageal reflux disease)   . Hypertension   . Overweight 04/17/2011  . Arthritis 04/17/2011  . Reflux 04/17/2011  . HTN (hypertension) 04/17/2011  . Tobacco abuse 04/17/2011  . Acute bronchitis 04/17/2011  . Vitamin d deficiency 04/17/2011  . Hand crush injury 07/05/2011  . Umbilical hernia 07/05/2011  . Hyperlipidemia 07/16/2011  . Visual changes 07/16/2011  . a 03/12/2012    Past Surgical History  Procedure Date  . Vasectomy   . Knee surgery   . Carpal tunnel release     right, needed some nerve repair as well  . Eye surgery     left for detachmenet  . Right knee arthroscopy     Family History  Problem Relation Age of Onset  . Cancer Mother 38    breast- remission  . Fibromyalgia Mother   . Other Father     CHF- stent   . Heart attack Father   . Heart disease Maternal Uncle   . Heart disease Maternal Grandmother   . Heart disease Maternal Grandfather     History   Social History    . Marital Status: Married    Spouse Name: N/A    Number of Children: N/A  . Years of Education: N/A   Occupational History  . Not on file.   Social History Main Topics  . Smoking status: Current Everyday Smoker -- 0.3 packs/day    Types: Cigarettes  . Smokeless tobacco: Never Used  . Alcohol Use: Yes     24 beers weekly  . Drug Use: No  . Sexually Active: Yes -- Male partner(s)   Other Topics Concern  . Not on file   Social History Narrative  . No narrative on file    Current Outpatient Prescriptions on File Prior to Visit  Medication Sig Dispense Refill  . fish oil-omega-3 fatty acids 1000 MG capsule Take 2 g by mouth 2 (two) times daily.        . multivitamin (THERAGRAN) per tablet Take 1 tablet by mouth daily.        . Naproxen-Esomeprazole (VIMOVO) 500-20 MG TBEC Take 1 tablet by mouth daily as needed.  30 tablet  1  . Nebulizer MISC by Does not apply route.        . niacin 500 MG CR capsule Take 1 capsule (500 mg total) by mouth daily. Take bid   30 capsule  2  . Red Yeast Rice  Extract (RED YEAST RICE PO) Take 2 tablets by mouth 2 (two) times daily.        Marland Kitchen VITAMIN D, ERGOCALCIFEROL, PO Take 1,200 mg by mouth daily.        Marland Kitchen losartan (COZAAR) 50 MG tablet Take 1 tablet (50 mg total) by mouth daily.  30 tablet  3    No Known Allergies  Review of Systems  Review of Systems  Constitutional: Negative for fever and malaise/fatigue.  HENT: Negative for congestion.   Eyes: Negative for discharge.  Respiratory: Positive for shortness of breath.   Cardiovascular: Positive for chest pain. Negative for palpitations and leg swelling.  Gastrointestinal: Negative for nausea, abdominal pain and diarrhea.  Genitourinary: Negative for dysuria.  Musculoskeletal: Positive for joint pain. Negative for falls.       Knee pain  Skin: Negative for rash.  Neurological: Negative for loss of consciousness and headaches.  Endo/Heme/Allergies: Negative for polydipsia.   Psychiatric/Behavioral: Negative for depression and suicidal ideas. The patient is not nervous/anxious and does not have insomnia.     Objective  BP 123/81  Pulse 69  Temp(Src) 97.1 F (36.2 C) (Temporal)  Ht 6' 3.75" (1.924 m)  Wt 238 lb 12.8 oz (108.319 kg)  BMI 29.26 kg/m2  SpO2 97%  Physical Exam  Physical Exam  Constitutional: He is oriented to person, place, and time and well-developed, well-nourished, and in no distress. No distress.  HENT:  Head: Normocephalic and atraumatic.  Eyes: Conjunctivae are normal.  Neck: Neck supple. No thyromegaly present.  Cardiovascular: Normal rate, regular rhythm and normal heart sounds.   No murmur heard. Pulmonary/Chest: Effort normal and breath sounds normal. No respiratory distress.  Abdominal: He exhibits no distension and no mass. There is no tenderness.  Musculoskeletal: He exhibits no edema.  Neurological: He is alert and oriented to person, place, and time.  Skin: Skin is warm.  Psychiatric: Memory, affect and judgment normal.    Lab Results  Component Value Date   TSH 0.59 07/12/2011   Lab Results  Component Value Date   WBC 5.9 07/12/2011   HGB 13.9 07/12/2011   HCT 40.8 07/12/2011   MCV 90.5 07/12/2011   PLT 257.0 07/12/2011   Lab Results  Component Value Date   CREATININE 1.0 07/12/2011   BUN 20 07/12/2011   NA 139 07/12/2011   K 4.4 07/12/2011   CL 105 07/12/2011   CO2 27 07/12/2011   Lab Results  Component Value Date   ALT 44 07/12/2011   AST 35 07/12/2011   ALKPHOS 61 07/12/2011   BILITOT 0.2* 07/12/2011   Lab Results  Component Value Date   CHOL 203* 07/12/2011   Lab Results  Component Value Date   HDL 42.50 07/12/2011   No results found for this basename: [facility]   Lab Results  Component Value Date   TRIG 162.0* 07/12/2011   Lab Results  Component Value Date   CHOLHDL 5 07/12/2011     Assessment & Plan  HTN (hypertension) Adequately controlled no changes.  Tobacco abuse Continues to smoke  varying amounts, encouraged complete cessation.  Acute bronchitis Resolved but does still note some dyspnea with exertion at times, given a refill on Albuterol  Chest pain, atypical Sharp often with movement, both axillary and substernal. Consider reflux vs cw strain. No classic cardiac symptoms but patient noted to have risk factors. Had a stress test roughly 5 years ago. Is offered a referral today but declines, his EKG was normal. Should take a  daily aspirin and keep sl ntg with him to use prn just in case. Seek immediate care if indicated

## 2012-03-12 NOTE — Assessment & Plan Note (Signed)
Sharp often with movement, both axillary and substernal. Consider reflux vs cw strain. No classic cardiac symptoms but patient noted to have risk factors. Had a stress test roughly 5 years ago. Is offered a referral today but declines, his EKG was normal. Should take a daily aspirin and keep sl ntg with him to use prn just in case. Seek immediate care if indicated

## 2012-03-12 NOTE — Assessment & Plan Note (Signed)
Resolved but does still note some dyspnea with exertion at times, given a refill on Albuterol

## 2012-03-13 ENCOUNTER — Ambulatory Visit: Payer: PRIVATE HEALTH INSURANCE | Admitting: Family Medicine

## 2012-07-02 ENCOUNTER — Other Ambulatory Visit: Payer: Self-pay | Admitting: *Deleted

## 2012-07-02 DIAGNOSIS — I10 Essential (primary) hypertension: Secondary | ICD-10-CM

## 2012-07-02 MED ORDER — LOSARTAN POTASSIUM 50 MG PO TABS: 50.0000 mg | ORAL_TABLET | Freq: Every day | ORAL | Status: DC

## 2012-07-02 NOTE — Telephone Encounter (Signed)
Faxed refill request received from pharmacy for LOSARTAN  Last filled by MD on 03/09/12, #30 X 3 Last seen on 03/09/12 Follow up NEEDS APPT 06/2012 RX sent with message to follow up.

## 2012-07-10 ENCOUNTER — Other Ambulatory Visit: Payer: Self-pay | Admitting: *Deleted

## 2012-07-10 DIAGNOSIS — J40 Bronchitis, not specified as acute or chronic: Secondary | ICD-10-CM

## 2012-07-10 MED ORDER — ALBUTEROL SULFATE HFA 108 (90 BASE) MCG/ACT IN AERS: 2.0000 | INHALATION_SPRAY | RESPIRATORY_TRACT | Status: DC | PRN

## 2012-08-17 ENCOUNTER — Ambulatory Visit (INDEPENDENT_AMBULATORY_CARE_PROVIDER_SITE_OTHER): Payer: PRIVATE HEALTH INSURANCE | Admitting: Family Medicine

## 2012-08-17 ENCOUNTER — Ambulatory Visit (INDEPENDENT_AMBULATORY_CARE_PROVIDER_SITE_OTHER)
Admission: RE | Admit: 2012-08-17 | Discharge: 2012-08-17 | Disposition: A | Discharge status: Home / Self Care | Payer: PRIVATE HEALTH INSURANCE | Source: Ambulatory Visit | Attending: Family Medicine | Admitting: Family Medicine

## 2012-08-17 ENCOUNTER — Encounter: Payer: Self-pay | Admitting: Family Medicine

## 2012-08-17 VITALS — BP 140/86 | HR 78 | Temp 97.2°F | Ht 75.75 in | Wt 234.1 lb

## 2012-08-17 DIAGNOSIS — R0789 Other chest pain: Secondary | ICD-10-CM

## 2012-08-17 DIAGNOSIS — I10 Essential (primary) hypertension: Secondary | ICD-10-CM

## 2012-08-17 DIAGNOSIS — K219 Gastro-esophageal reflux disease without esophagitis: Secondary | ICD-10-CM

## 2012-08-17 DIAGNOSIS — K769 Liver disease, unspecified: Secondary | ICD-10-CM

## 2012-08-17 DIAGNOSIS — R252 Cramp and spasm: Secondary | ICD-10-CM

## 2012-08-17 DIAGNOSIS — J45909 Unspecified asthma, uncomplicated: Secondary | ICD-10-CM

## 2012-08-17 DIAGNOSIS — R945 Abnormal results of liver function studies: Secondary | ICD-10-CM

## 2012-08-17 DIAGNOSIS — E785 Hyperlipidemia, unspecified: Secondary | ICD-10-CM

## 2012-08-17 DIAGNOSIS — S6390XA Sprain of unspecified part of unspecified wrist and hand, initial encounter: Secondary | ICD-10-CM

## 2012-08-17 DIAGNOSIS — S6720XA Crushing injury of unspecified hand, initial encounter: Secondary | ICD-10-CM

## 2012-08-17 DIAGNOSIS — F17200 Nicotine dependence, unspecified, uncomplicated: Secondary | ICD-10-CM

## 2012-08-17 DIAGNOSIS — S66912A Strain of unspecified muscle, fascia and tendon at wrist and hand level, left hand, initial encounter: Secondary | ICD-10-CM

## 2012-08-17 DIAGNOSIS — J40 Bronchitis, not specified as acute or chronic: Secondary | ICD-10-CM

## 2012-08-17 DIAGNOSIS — Z720 Tobacco use: Secondary | ICD-10-CM

## 2012-08-17 LAB — CBC
HCT: 44.2 % (ref 39.0–52.0)
MCHC: 33 g/dL (ref 30.0–36.0)
MCV: 91.2 fl (ref 78.0–100.0)
RBC: 4.84 Mil/uL (ref 4.22–5.81)

## 2012-08-17 LAB — HEPATIC FUNCTION PANEL
ALT: 60 U/L — ABNORMAL HIGH (ref 0–53)
Total Bilirubin: 0.6 mg/dL (ref 0.3–1.2)
Total Protein: 7.4 g/dL (ref 6.0–8.3)

## 2012-08-17 LAB — RENAL FUNCTION PANEL
Calcium: 9.5 mg/dL (ref 8.4–10.5)
Creatinine, Ser: 0.7 mg/dL (ref 0.4–1.5)
Glucose, Bld: 108 mg/dL — ABNORMAL HIGH (ref 70–99)
Phosphorus: 2.9 mg/dL (ref 2.3–4.6)
Sodium: 138 mEq/L (ref 135–145)

## 2012-08-17 LAB — LIPID PANEL: Cholesterol: 240 mg/dL — ABNORMAL HIGH (ref 0–200)

## 2012-08-17 MED ORDER — LOSARTAN POTASSIUM 50 MG PO TABS: 50.0000 mg | ORAL_TABLET | Freq: Every day | ORAL | Status: DC

## 2012-08-17 MED ORDER — BECLOMETHASONE DIPROPIONATE 80 MCG/ACT IN AERS: 1.0000 | INHALATION_SPRAY | Freq: Two times a day (BID) | RESPIRATORY_TRACT | Status: DC

## 2012-08-17 MED ORDER — NITROGLYCERIN 0.4 MG SL SUBL: 0.4000 mg | SUBLINGUAL_TABLET | SUBLINGUAL | Status: DC | PRN

## 2012-08-17 MED ORDER — ALBUTEROL SULFATE (2.5 MG/3ML) 0.083% IN NEBU: 2.5000 mg | INHALATION_SOLUTION | Freq: Four times a day (QID) | RESPIRATORY_TRACT | Status: DC | PRN

## 2012-08-17 MED ORDER — HYDROCODONE-ACETAMINOPHEN 5-325 MG PO TABS: 1.0000 | ORAL_TABLET | Freq: Three times a day (TID) | ORAL | Status: DC | PRN

## 2012-08-17 MED ORDER — ALBUTEROL SULFATE HFA 108 (90 BASE) MCG/ACT IN AERS: 2.0000 | INHALATION_SPRAY | RESPIRATORY_TRACT | Status: DC | PRN

## 2012-08-17 MED ORDER — OMEPRAZOLE 40 MG PO CPDR
40.0000 mg | DELAYED_RELEASE_CAPSULE | Freq: Every day | ORAL | Status: DC
Start: 2012-08-17 — End: 2013-06-05

## 2012-08-17 MED ORDER — RANITIDINE HCL 300 MG PO CAPS: 300.0000 mg | ORAL_CAPSULE | Freq: Every day | ORAL | Status: DC | PRN

## 2012-08-17 NOTE — Patient Instructions (Addendum)
Leg Cramps Leg cramps that occur during exercise can be caused by poor circulation or dehydration. However, muscle cramps that occur at rest or during the night are usually not due to any serious medical problem. Heat cramps may cause muscle spasms during hot weather.  CAUSES There is no clear cause for muscle cramps. However, dehydration may be a factor for those who do not drink enough fluids and those who exercise in the heat. Imbalances in the level of sodium, potassium, calcium or magnesium in the muscle tissue may also be a factor. Some medications, such as water pills (diuretics), may cause loss of chemicals that the body needs (like sodium and potassium) and cause muscle cramps. TREATMENT   Make sure your diet has enough fluids and essential minerals for the muscle to work normally.   Avoid strenuous exercise for several days if you have been having frequent leg cramps.   Stretch and massage the cramped muscle for several minutes.   Some medicines may be helpful in some patients with night cramps. Only take over-the-counter or prescription medicines as directed by your caregiver.  SEEK IMMEDIATE MEDICAL CARE IF:   Your leg cramps become worse.   Your foot becomes cold, numb, or blue.  Document Released: 01/16/2005 Document Revised: 11/28/2011 Document Reviewed: 01/03/2009 [facility] Patient Information 2012 [place], [place]. Try Magnesium if continues or [facility] night time leg cramp medicine as needed

## 2012-08-17 NOTE — Assessment & Plan Note (Addendum)
Did not take his Losartan today, adequately controlled considering

## 2012-08-17 NOTE — Progress Notes (Signed)
Quick Note:  Patient Informed and voiced understanding.  Pt states he will call back when he gets his schedule from work ______

## 2012-08-18 LAB — LDL CHOLESTEROL, DIRECT: Direct LDL: 155.7 mg/dL

## 2012-08-18 LAB — TSH: TSH: 2.08 u[IU]/mL (ref 0.35–5.50)

## 2012-08-20 ENCOUNTER — Encounter: Payer: Self-pay | Admitting: Family Medicine

## 2012-08-20 DIAGNOSIS — R945 Abnormal results of liver function studies: Secondary | ICD-10-CM

## 2012-08-20 HISTORY — DX: Abnormal results of liver function studies: R94.5

## 2012-08-20 NOTE — Assessment & Plan Note (Signed)
Given refill on hydrocodone to use sparingly

## 2012-08-20 NOTE — Progress Notes (Signed)
Patient ID: Marc Hansen, male   DOB: 03-20-1961, 51 y.o.   MRN: [id number] Marc Hansen [id number] March 09, 1961 08/20/2012      Progress Note-Follow Up  Subjective  Chief Complaint  Chief Complaint  Patient presents with  . Medication Refill    HPI  Patient is a 51 year old Caucasian male who is in today for followup. Overall he says he is doing relatively well. He is down to just 6 cigarettes a day. Has been using a cigarette with good effect. He has days where he does not smoke any cigarettes at all. He continues to struggle with pain most notably in the shoulders and knees on the right. Had recent episodes of cramping in his hands and feet as well as the stomach. Reports increase his fluid intake this improved somewhat. He has been having these intermittent episodes of sharp right-sided chest pain or left in the last month. He has no diaphoresis, nausea, shortness of breath with it. He does note with position changes and progressing on and reproduced the pain. No recent illness, fevers, chills, congestion noted  Past Medical History  Diagnosis Date  . Allergy     seasonal  . GERD (gastroesophageal reflux disease)   . Hypertension   . Overweight 04/17/2011  . Arthritis 04/17/2011  . Reflux 04/17/2011  . HTN (hypertension) 04/17/2011  . Tobacco abuse 04/17/2011  . Acute bronchitis 04/17/2011  . Vitamin d deficiency 04/17/2011  . Hand crush injury 07/05/2011  . Umbilical hernia 07/05/2011  . Hyperlipidemia 07/16/2011  . Visual changes 07/16/2011  . a 03/12/2012  . Abnormal liver function 08/20/2012    Past Surgical History  Procedure Date  . Vasectomy   . Knee surgery   . Carpal tunnel release     right, needed some nerve repair as well  . Eye surgery     left for detachmenet  . Right knee arthroscopy     Family History  Problem Relation Age of Onset  . Cancer Mother 51    breast- remission  . Fibromyalgia Mother   . Other Father     CHF- stent   . Heart attack Father   .  Heart disease Maternal Uncle   . Heart disease Maternal Grandmother   . Heart disease Maternal Grandfather     History   Social History  . Marital Status: Married    Spouse Name: N/A    Number of Children: N/A  . Years of Education: N/A   Occupational History  . Not on file.   Social History Main Topics  . Smoking status: Current Everyday Smoker -- 0.3 packs/day    Types: Cigarettes  . Smokeless tobacco: Never Used  . Alcohol Use: Yes     24 beers weekly  . Drug Use: No  . Sexually Active: Yes -- Male partner(s)   Other Topics Concern  . Not on file   Social History Narrative  . No narrative on file    Current Outpatient Prescriptions on File Prior to Visit  Medication Sig Dispense Refill  . albuterol (PROVENTIL) (2.5 MG/3ML) 0.083% nebulizer solution Take 3 mLs (2.5 mg total) by nebulization every 6 (six) hours as needed.  100 vial  2  . aspirin 325 MG tablet Take 325 mg by mouth daily.      . cetirizine (ZYRTEC) 10 MG tablet Take 10 mg by mouth daily.      . Coenzyme Q10 (COQ10 PO) Take 1 tablet by mouth daily.      . fish  oil-omega-3 fatty acids 1000 MG capsule Take 2 g by mouth 2 (two) times daily.        Marland Kitchen losartan (COZAAR) 50 MG tablet Take 1 tablet (50 mg total) by mouth daily. NEEDS APPT FOR MORE REFILLS.  30 tablet  6  . multivitamin (THERAGRAN) per tablet Take 1 tablet by mouth daily.        . Naproxen-Esomeprazole (VIMOVO) 500-20 MG TBEC Take 1 tablet by mouth daily as needed.  30 tablet  1  . Nebulizer MISC by Does not apply route.        . niacin 500 MG CR capsule Take 1 capsule (500 mg total) by mouth daily. Take bid   30 capsule  2  . Red Yeast Rice Extract (RED YEAST RICE PO) Take 2 tablets by mouth 2 (two) times daily.        Marland Kitchen VITAMIN D, ERGOCALCIFEROL, PO Take 1,200 mg by mouth daily.        . beclomethasone (QVAR) 80 MCG/ACT inhaler Inhale 1 puff into the lungs 2 (two) times daily.  1 Inhaler  12  . nitroGLYCERIN (NITROSTAT) 0.4 MG SL tablet Place  1 tablet (0.4 mg total) under the tongue every 5 (five) minutes as needed for chest pain.  25 tablet  1  . omeprazole (PRILOSEC) 40 MG capsule Take 1 capsule (40 mg total) by mouth daily. May alternate with ranitidine  30 capsule  2  . ranitidine (ZANTAC) 300 MG capsule Take 1 capsule (300 mg total) by mouth daily as needed for heartburn. Can alternate with Omeprazole  30 capsule  2  . DISCONTD: esomeprazole (NEXIUM) 40 MG capsule Take 1 capsule (40 mg total) by mouth daily.  30 capsule  5  . DISCONTD: Fluticasone Propionate, Inhal, (FLOVENT DISKUS) 100 MCG/BLIST AEPB Prn   60 each  1  . DISCONTD: loratadine (CLARITIN) 10 MG tablet Take 1 tablet (10 mg total) by mouth daily.  30 tablet  2    No Known Allergies  Review of Systems  Review of Systems  Constitutional: Negative for fever and malaise/fatigue.  HENT: Negative for congestion.   Eyes: Negative for discharge.  Respiratory: Negative for shortness of breath.   Cardiovascular: Negative for chest pain, palpitations and leg swelling.  Gastrointestinal: Negative for nausea, abdominal pain and diarrhea.  Genitourinary: Negative for dysuria.  Musculoskeletal: Positive for joint pain. Negative for falls.       Hand pain  Skin: Negative for rash.  Neurological: Negative for loss of consciousness and headaches.  Endo/Heme/Allergies: Negative for polydipsia.  Psychiatric/Behavioral: Negative for depression and suicidal ideas. The patient is not nervous/anxious and does not have insomnia.     Objective  BP 140/86  Pulse 78  Temp 97.2 F (36.2 C) (Temporal)  Ht 6' 3.75" (1.924 m)  Wt 234 lb 1.9 oz (106.196 kg)  BMI 28.69 kg/m2  SpO2 78%  Physical Exam  Physical Exam  Constitutional: He is oriented to person, place, and time and well-developed, well-nourished, and in no distress. No distress.  HENT:  Head: Normocephalic and atraumatic.  Eyes: Conjunctivae are normal.  Neck: Neck supple. No thyromegaly present.  Cardiovascular:  Normal rate, regular rhythm and normal heart sounds.   No murmur heard. Pulmonary/Chest: Effort normal and breath sounds normal. No respiratory distress.  Abdominal: He exhibits no distension and no mass. There is no tenderness.  Musculoskeletal: He exhibits no edema.  Neurological: He is alert and oriented to person, place, and time.  Skin: Skin is warm.  Psychiatric: Memory, affect and judgment normal.    Lab Results  Component Value Date   TSH 2.08 08/17/2012   Lab Results  Component Value Date   WBC 7.9 08/17/2012   HGB 14.6 08/17/2012   HCT 44.2 08/17/2012   MCV 91.2 08/17/2012   PLT 259.0 08/17/2012   Lab Results  Component Value Date   CREATININE 0.7 08/17/2012   BUN 22 08/17/2012   NA 138 08/17/2012   K 4.4 08/17/2012   CL 103 08/17/2012   CO2 25 08/17/2012   Lab Results  Component Value Date   ALT 60* 08/17/2012   AST 32 08/17/2012   ALKPHOS 73 08/17/2012   BILITOT 0.6 08/17/2012   Lab Results  Component Value Date   CHOL 240* 08/17/2012   Lab Results  Component Value Date   HDL 41.90 08/17/2012   No results found for this basename: LDLCALC   Lab Results  Component Value Date   TRIG 223.0* 08/17/2012   Lab Results  Component Value Date   CHOLHDL 6 08/17/2012     Assessment & Plan  HTN (hypertension) Did not take his Losartan today, adequately controlled considering  Tobacco abuse Has cut down to 6 cig daily and is  Trying to quit using the ecig, offered encouragement  Reflux Adequately controlled, may use meds prn and avoid offending foods  Hyperlipidemia Avoid trans fats, attempt weight loss, add vitamin b complex and krill oil.  Abnormal liver function Mild, avoid trans fats, minimize saturated fats and simple carbs  Hand crush injury Given refill on hydrocodone to use sparingly

## 2012-08-20 NOTE — Assessment & Plan Note (Signed)
Avoid trans fats, attempt weight loss, add vitamin b complex and krill oil.

## 2012-08-20 NOTE — Assessment & Plan Note (Signed)
Adequately controlled, may use meds prn and avoid offending foods

## 2012-08-20 NOTE — Assessment & Plan Note (Signed)
Has cut down to 6 cig daily and is  Trying to quit using the ecig, offered encouragement

## 2012-08-20 NOTE — Assessment & Plan Note (Signed)
Mild, avoid trans fats, minimize saturated fats and simple carbs

## 2012-10-13 ENCOUNTER — Telehealth: Payer: Self-pay

## 2012-10-13 NOTE — Telephone Encounter (Signed)
Please look into his chart and see where the COPD is in his chart I reviewed my notes and do not see where I every officially diagnosed him with this. Once i know where it came from then I will address this

## 2012-10-13 NOTE — Telephone Encounter (Signed)
Patient would like a letter for his insurance company stating that he doesn't have COPD? Pt stated that somewhere along the line this got added to his problems? Please advise if the letter can be done or what patient needs to do?

## 2012-10-14 NOTE — Telephone Encounter (Signed)
I spoke with pts wife and she stated that a Dr in [place] that Pt saw once stated pt had COPD without doing any kind of testing? Please advise?

## 2012-10-15 NOTE — Telephone Encounter (Signed)
Unfortunately for him, it is not in my chart but it is likely he does have some lung disease due to his history, I would be happy to run tests that prove he does or does not

## 2012-10-16 NOTE — Telephone Encounter (Signed)
Left a message for pt to return my call. 

## 2012-11-09 ENCOUNTER — Encounter: Payer: Self-pay | Admitting: Family Medicine

## 2012-11-09 ENCOUNTER — Ambulatory Visit (INDEPENDENT_AMBULATORY_CARE_PROVIDER_SITE_OTHER): Payer: PRIVATE HEALTH INSURANCE | Admitting: Family Medicine

## 2012-11-09 VITALS — BP 119/91 | HR 84 | Temp 97.8°F | Ht 75.75 in | Wt 239.4 lb

## 2012-11-09 DIAGNOSIS — M7710 Lateral epicondylitis, unspecified elbow: Secondary | ICD-10-CM

## 2012-11-09 DIAGNOSIS — M7700 Medial epicondylitis, unspecified elbow: Secondary | ICD-10-CM

## 2012-11-09 DIAGNOSIS — M7701 Medial epicondylitis, right elbow: Secondary | ICD-10-CM

## 2012-11-09 DIAGNOSIS — Z87891 Personal history of nicotine dependence: Secondary | ICD-10-CM

## 2012-11-09 DIAGNOSIS — IMO0002 Reserved for concepts with insufficient information to code with codable children: Secondary | ICD-10-CM

## 2012-11-09 DIAGNOSIS — Z23 Encounter for immunization: Secondary | ICD-10-CM

## 2012-11-09 DIAGNOSIS — I10 Essential (primary) hypertension: Secondary | ICD-10-CM

## 2012-11-09 DIAGNOSIS — F17201 Nicotine dependence, unspecified, in remission: Secondary | ICD-10-CM

## 2012-11-09 DIAGNOSIS — E785 Hyperlipidemia, unspecified: Secondary | ICD-10-CM

## 2012-11-09 DIAGNOSIS — R0609 Other forms of dyspnea: Secondary | ICD-10-CM

## 2012-11-09 HISTORY — DX: Medial epicondylitis, right elbow: M77.01

## 2012-11-09 MED ORDER — DICLOFENAC SODIUM 3 % TD GEL: TRANSDERMAL | Status: DC

## 2012-11-09 NOTE — Assessment & Plan Note (Signed)
Adequately controlled, no changes to meds today 

## 2012-11-09 NOTE — Assessment & Plan Note (Signed)
Long history of tobacco abuse quit about 5 months ago. Never had any pulmonary function tests in past and would like them to rule in or rule out the diagnosis of copd. Ordered today

## 2012-11-09 NOTE — Progress Notes (Signed)
Patient ID: Marc Hansen, male   DOB: 1961-10-17, 51 y.o.   MRN: [id number] [employer] [id number] 01/17/1961 11/09/2012      Progress Note-Follow Up  Subjective  Chief Complaint  Chief Complaint  Patient presents with  . do testing for COPD    another MD in the past put in pt charts that he has COPD without any testing  . Injections    flu    HPI  Patient is a 51 year old Caucasian male who is in today for further evaluation. He notes his elbow pain which he is previously discussed has worsened. It is present most days. It is over the medial aspect. It's worse with excessive. No significant swelling or erythema. No acute injury. He is interested in having some pulmonary function testing done. Never undergone a before but in the past has been told he does have COPD. He quit smoking 56 months ago and is actually feeling very well at this time but he does note he continues to struggle with some dyspnea on exertion he thinks is out of proportion. Denies headache, chest pain, palpitations, shortness of breath at rest, GI or GU concerns otherwise. No nausea vomiting or diaphoresis. No recent illness fevers or other concerns noted.  Past Medical History  Diagnosis Date  . Allergy     seasonal  . GERD (gastroesophageal reflux disease)   . Hypertension   . Overweight 04/17/2011  . Arthritis 04/17/2011  . Reflux 04/17/2011  . HTN (hypertension) 04/17/2011  . Tobacco abuse 04/17/2011  . Acute bronchitis 04/17/2011  . Vitamin D deficiency 04/17/2011  . Hand crush injury 07/05/2011  . Umbilical hernia 07/05/2011  . Hyperlipidemia 07/16/2011  . Visual changes 07/16/2011  . a 03/12/2012  . Abnormal liver function 08/20/2012  . DOE (dyspnea on exertion) 11/09/2012  . 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 Medial epicondylitis of right elbow 11/09/2012    Past Surgical History  Procedure Date  . Vasectomy   . Knee surgery   . Carpal tunnel release     right, needed some nerve repair as well    . Eye surgery     left for detachmenet  . Right knee arthroscopy     Family History  Problem Relation Age of Onset  . Cancer Mother 95    breast- remission  . Fibromyalgia Mother   . Other Father     CHF- stent   . Heart attack Father   . Heart disease Maternal Uncle   . Heart disease Maternal Grandmother   . Heart disease Maternal Grandfather     History   Social History  . Marital Status: Married    Spouse Name: N/A    Number of Children: N/A  . Years of Education: N/A   Occupational History  . Not on file.   Social History Main Topics  . Smoking status: Current Every Day Smoker -- 0.3 packs/day    Types: Cigarettes  . Smokeless tobacco: Never Used  . Alcohol Use: Yes     Comment: 24 beers weekly  . Drug Use: No  . Sexually Active: Yes -- Male partner(s)   Other Topics Concern  . Not on file   Social History Narrative  . No narrative on file    Current Outpatient Prescriptions on File Prior to Visit  Medication Sig Dispense Refill  . albuterol (PROVENTIL HFA;VENTOLIN HFA) 108 (90 BASE) MCG/ACT inhaler Inhale 2 puffs into the lungs as needed.  1 Inhaler  11  . albuterol (PROVENTIL) (  2.5 MG/3ML) 0.083% nebulizer solution Take 3 mLs (2.5 mg total) by nebulization every 6 (six) hours as needed.  100 vial  2  . aspirin 325 MG tablet Take 325 mg by mouth daily.      . beclomethasone (QVAR) 80 MCG/ACT inhaler Inhale 1 puff into the lungs 2 (two) times daily.  1 Inhaler  12  . cetirizine (ZYRTEC) 10 MG tablet Take 10 mg by mouth daily.      . Coenzyme Q10 (COQ10 PO) Take 1 tablet by mouth daily.      . fish oil-omega-3 fatty acids 1000 MG capsule Take 2 g by mouth 2 (two) times daily.        Marland Kitchen losartan (COZAAR) 50 MG tablet Take 1 tablet (50 mg total) by mouth daily. NEEDS APPT FOR MORE REFILLS.  30 tablet  6  . multivitamin (THERAGRAN) per tablet Take 1 tablet by mouth daily.        . Naproxen-Esomeprazole (VIMOVO) 500-20 MG TBEC Take 1 tablet by mouth daily as  needed.  30 tablet  1  . Nebulizer MISC by Does not apply route.        . niacin 500 MG CR capsule Take 1 capsule (500 mg total) by mouth daily. Take bid   30 capsule  2  . omeprazole (PRILOSEC) 40 MG capsule Take 1 capsule (40 mg total) by mouth daily. May alternate with ranitidine  30 capsule  2  . ranitidine (ZANTAC) 300 MG capsule Take 1 capsule (300 mg total) by mouth daily as needed for heartburn. Can alternate with Omeprazole  30 capsule  2  . Red Yeast Rice Extract (RED YEAST RICE PO) Take 2 tablets by mouth 2 (two) times daily.        Marland Kitchen VITAMIN D, ERGOCALCIFEROL, PO Take 1,200 mg by mouth daily.        . nitroGLYCERIN (NITROSTAT) 0.4 MG SL tablet Place 1 tablet (0.4 mg total) under the tongue every 5 (five) minutes as needed for chest pain.  25 tablet  1  . [DISCONTINUED] esomeprazole (NEXIUM) 40 MG capsule Take 1 capsule (40 mg total) by mouth daily.  30 capsule  5  . [DISCONTINUED] Fluticasone Propionate, Inhal, (FLOVENT DISKUS) 100 MCG/BLIST AEPB Prn   60 each  1  . [DISCONTINUED] loratadine (CLARITIN) 10 MG tablet Take 1 tablet (10 mg total) by mouth daily.  30 tablet  2    No Known Allergies  Review of Systems  Review of Systems  Constitutional: Negative for fever and malaise/fatigue.  HENT: Negative for congestion.   Eyes: Negative for discharge.  Respiratory: Positive for shortness of breath. Negative for cough, sputum production and wheezing.   Cardiovascular: Negative for chest pain, palpitations, orthopnea, claudication and leg swelling.  Gastrointestinal: Negative for nausea, abdominal pain and diarrhea.  Genitourinary: Negative for dysuria.  Musculoskeletal: Positive for joint pain. Negative for falls.       Right elbow pain  Skin: Negative for rash.  Neurological: Negative for loss of consciousness and headaches.  Endo/Heme/Allergies: Negative for polydipsia.  Psychiatric/Behavioral: Negative for depression and suicidal ideas. The patient is not nervous/anxious  and does not have insomnia.     Objective  BP 119/91  Pulse 84  Temp 97.8 F (36.6 C) (Temporal)  Ht 6' 3.75" (1.924 m)  Wt 239 lb 6.4 oz (108.591 kg)  BMI 29.33 kg/m2  SpO2 99%  Physical Exam  Physical Exam  Constitutional: He is oriented to person, place, and time and well-developed, well-nourished,  and in no distress. No distress.  HENT:  Head: Normocephalic and atraumatic.  Eyes: Conjunctivae normal are normal.  Neck: Neck supple. No thyromegaly present.  Cardiovascular: Normal rate, regular rhythm and normal heart sounds.   No murmur heard. Pulmonary/Chest: Effort normal and breath sounds normal. No respiratory distress.  Abdominal: He exhibits no distension and no mass. There is no tenderness.  Musculoskeletal: He exhibits no edema.  Neurological: He is alert and oriented to person, place, and time.  Skin: Skin is warm.  Psychiatric: Memory, affect and judgment normal.    Lab Results  Component Value Date   TSH 2.08 08/17/2012   Lab Results  Component Value Date   WBC 7.9 08/17/2012   HGB 14.6 08/17/2012   HCT 44.2 08/17/2012   MCV 91.2 08/17/2012   PLT 259.0 08/17/2012   Lab Results  Component Value Date   CREATININE 0.7 08/17/2012   BUN 22 08/17/2012   NA 138 08/17/2012   K 4.4 08/17/2012   CL 103 08/17/2012   CO2 25 08/17/2012   Lab Results  Component Value Date   ALT 60* 08/17/2012   AST 32 08/17/2012   ALKPHOS 73 08/17/2012   BILITOT 0.6 08/17/2012   Lab Results  Component Value Date   CHOL 240* 08/17/2012   Lab Results  Component Value Date   HDL 41.90 08/17/2012   No results found for this basename: [facility]   Lab Results  Component Value Date   TRIG 223.0* 08/17/2012   Lab Results  Component Value Date   CHOLHDL 6 08/17/2012     Assessment & Plan  HTN (hypertension) Adequately controlled, no changes to meds today  DOE (dyspnea on exertion) Long history of tobacco abuse quit about 5 months ago. Never had any pulmonary function tests in  past and would like them to rule in or rule out the diagnosis of copd. Ordered today  Tobacco abuse, in remission Quit 5-6 months ago, encouraged the same  Hyperlipidemia Agrees to return in 4-6 months for further evaluation and fasting labs  Medial epicondylitis of right elbow Given an rx for [employer] topical gel to apply qid as needed, for mail for epicondylitis. Advanced neuropathic component. If this is not helpful he may need further referral.

## 2012-11-09 NOTE — Telephone Encounter (Signed)
Pt in today to discuss

## 2012-11-09 NOTE — Assessment & Plan Note (Signed)
Given an rx for [employer] topical gel to apply qid as needed, for mail for epicondylitis. Advanced neuropathic component. If this is not helpful he may need further referral.

## 2012-11-09 NOTE — Assessment & Plan Note (Signed)
Agrees to return in 4-6 months for further evaluation and fasting labs

## 2012-11-09 NOTE — Patient Instructions (Addendum)
Medial Epicondylitis (Golfer's Elbow) with Rehab Medial epicondylitis involves inflammation and pain around the inner (medial) portion of the elbow. This pain is caused by inflammation of the tendons in the forearm that flex (bring down) the wrist. Medial epicondylitis is also called golfer's elbow, because it is common among golfers. However, it may occur in any individual who flexes the wrist regularly. If medial epicondylitis is left untreated, it may become a chronic problem. SYMPTOMS   Pain, tenderness, or inflammation over the inner (medial) side of the elbow.  Pain or weakness with gripping activities.  Pain that increases with wrist twisting motions (using a screwdriver, playing golf, bowling). CAUSES  Medial epicondylitis is caused by inflammation of the tendons that flex the wrist. Causes of injury may include:  Chronic, repetitive stress and strain to the tendons that run from the wrist and forearm to the elbow.  Sudden strain on the forearm, including wrist snap when serving balls with racquet sports, or throwing a baseball. RISK INCREASES WITH:  Sports or occupations that require repetitive and/or strenuous forearm and wrist movements (pitching a baseball, golfing, carpentry).  Poor wrist and forearm strength and flexibility.  Failure to warm up properly before activity.  Resuming activity before healing, rehabilitation, and conditioning are complete. PREVENTION   Warm up and stretch properly before activity.  Maintain physical fitness:  Strength, flexibility, and endurance.  Cardiovascular fitness.  Wear and use properly fitted equipment.  Learn and use proper technique and have a coach correct improper technique.  Wear a tennis elbow (counterforce) brace. PROGNOSIS  The course of this condition depends on the degree of the injury. If treated properly, acute cases (symptoms lasting less than 4 weeks) are often resolved in 2 to 6 weeks. Chronic (longer lasting  cases) often resolve in 3 to 6 months, but may require physical therapy. RELATED COMPLICATIONS   Frequently recurring symptoms, resulting in a chronic problem. Properly treating the problem the first time decreases frequency of recurrence.  Chronic inflammation, scarring, and partial tendon tear, requiring surgery.  Delayed healing or resolution of symptoms. TREATMENT  Treatment first involves the use of ice and medicine, to reduce pain and inflammation. Strengthening and stretching exercises may reduce discomfort, if performed regularly. These exercises may be performed at home, if the condition is an acute injury. Chronic cases may require a referral to a physical therapist for evaluation and treatment. Your caregiver may advise a corticosteroid injection to help reduce inflammation. Rarely, surgery is needed. MEDICATION  If pain medicine is needed, nonsteroidal anti-inflammatory medicines (aspirin and ibuprofen), or other minor pain relievers (acetaminophen), are often advised.  Do not take pain medicine for 7 days before surgery.  Prescription pain relievers may be given, if your caregiver thinks they are needed. Use only as directed and only as much as you need.  Corticosteroid injections may be recommended. These injections should be reserved only for the most severe cases, because they can only be given a certain number of times. HEAT AND COLD  Cold treatment (icing) should be applied for 10 to 15 minutes every 2 to 3 hours for inflammation and pain, and immediately after activity that aggravates your symptoms. Use ice packs or an ice massage.  Heat treatment may be used before performing stretching and strengthening activities prescribed by your caregiver, physical therapist, or athletic trainer. Use a heat pack or a warm water soak. SEEK MEDICAL CARE IF: Symptoms get worse or do not improve in 2 weeks, despite treatment. EXERCISES  RANGE OF MOTION (  prescribed by your caregiver, physical therapist, or athletic trainer. Use a heat pack or a warm water soak.  SEEK MEDICAL CARE IF:  Symptoms get worse or do not improve in 2 weeks, despite treatment.  EXERCISES   RANGE OF MOTION (ROM) AND STRETCHING EXERCISES -  Epicondylitis, Medial (Golfer's Elbow)  These exercises may help you when beginning to rehabilitate your injury. Your symptoms may go away with or without further involvement from your physician, physical therapist or athletic trainer. While completing these exercises, remember:   · Restoring tissue flexibility helps normal motion to return to the joints. This allows healthier, less painful movement and activity.  · An effective stretch should be held for at least 30 seconds.  · A stretch should never be painful. You should only feel a gentle lengthening or release in the stretched tissue.  RANGE OF MOTION  Wrist Flexion, Active-Assisted  · Extend your right / left elbow with your fingers pointing down.*  · Gently pull the back of your hand towards you, until you feel a gentle stretch on the top of your forearm.  · Hold this position for __________ seconds.  Repeat __________ times. Complete this exercise __________ times per day.   *If directed by your physician, physical therapist or athletic trainer, complete this stretch with your elbow bent, rather than extended.  RANGE OF MOTION  Wrist Extension, Active-Assisted  · Extend your right / left elbow and turn your palm upwards.*  · Gently pull your palm and fingertips back, so your wrist extends and your fingers point more toward the ground.  · You should feel a gentle stretch on the inside of your forearm.  · Hold this position for __________ seconds.  Repeat __________ times. Complete this exercise __________ times per day.  *If directed by your physician, physical therapist or athletic trainer, complete this stretch with your elbow bent, rather than extended.  STRETCH  Wrist Extension   · Place your right / left fingertips on a tabletop leaving your elbow slightly bent. Your fingers should point backwards.  · Gently press your fingers and palm down onto the table, by straightening your elbow. You should feel a stretch on the inside of your forearm.  · Hold this  position for __________ seconds.  Repeat __________ times. Complete this stretch __________ times per day.   STRENGTHENING EXERCISES - Epicondylitis, Medial (Golfer's Elbow)  These exercises may help you when beginning to rehabilitate your injury. They may resolve your symptoms with or without further involvement from your physician, physical therapist or athletic trainer. While completing these exercises, remember:   · Muscles can gain both the endurance and the strength needed for everyday activities through controlled exercises.  · Complete these exercises as instructed by your physician, physical therapist or athletic trainer. Increase the resistance and repetitions only as guided.  · You may experience muscle soreness or fatigue, but the pain or discomfort you are trying to eliminate should never worsen during these exercises. If this pain does get worse, stop and make sure you are following the directions exactly. If the pain is still present after adjustments, discontinue the exercise until you can discuss the trouble with your caregiver.  STRENGTH Wrist Flexors  · Sit with your right / left forearm palm-up, and fully supported on a table or countertop. Your elbow should be resting below the height of your shoulder. Allow your wrist to extend over the edge of the surface.  · Loosely holding a __________ weight, or a piece   your wrist to extend over the edge of the surface.  Loosely holding a __________ weight, or a piece of rubber exercise band or tubing, slowly curl  your hand up toward your forearm.  Hold this position for __________ seconds. Slowly lower the wrist back to the starting position in a controlled manner. Repeat __________ times. Complete this exercise __________ times per day.  STRENGTH - Ulnar Deviators  Stand with a ____________________ weight in your right / left hand, or sit while holding a rubber exercise band or tubing, with your healthy arm supported on a table or countertop.  Move your wrist so that your pinkie travels toward your forearm and your thumb moves away from your forearm.  Hold this position for __________ seconds and then slowly lower the wrist back to the starting position. Repeat __________ times. Complete this exercise __________ times per day STRENGTH - Grip   Grasp a tennis ball, a dense sponge, or a large, rolled sock in your hand.  Squeeze as hard as you can, without increasing any pain.  Hold this position for __________ seconds. Release your grip slowly. Repeat __________ times. Complete this exercise __________ times per day.  STRENGTH  Forearm Supinators   Sit with your right / left forearm supported on a table, keeping your elbow below shoulder height. Rest your hand over the edge, palm down.  Gently grip a hammer or a soup ladle.  Without moving your elbow, slowly turn your palm and hand upward to a "thumbs-up" position.  Hold this position for __________ seconds. Slowly return to the starting position. Repeat __________ times. Complete this exercise __________ times per day.  STRENGTH  Forearm Pronators  Sit with your right / left forearm supported on a table, keeping your elbow below shoulder height. Rest your hand over the edge, palm up.  Gently grip a hammer or a soup ladle.  Without moving your elbow, slowly turn your palm and hand upward to a "thumbs-up" position.  Hold this position for __________ seconds. Slowly return to the starting position. Repeat __________ times. Complete this  exercise __________ times per day.  Document Released: 12/09/2005 Document Revised: 03/02/2012 Document Reviewed: 03/23/2009 [facility] Patient Information 2013 [place], [place].     Pulmonary Function Tests A pulmonary function test measures how well you move air in and out of your lungs. There are a number of tests that can be done. The most often the measurement used is the peak flow test. This test can also be used at home. Examples of these include the Tru Zone, Astech, [place], and Spir-O-Flow meters.  Measuring how well air moves out of your lung can be used as a reliable guide to treatment, even if you have no symptoms. Normal values of peak flow rates depend on your age, height, and sex. If your peak flow measurement is lower than normal, treatment with inhaled medicines can be started to prevent a more serious episode of asthma or emphysema. You should measure your peak flows daily in the morning, and more often throughout the day if you have any symptoms such as shortness of breath or cough. Keep a record of your peak flow results to review with your caregiver. The mouth piece of your peak flow meter should be washed with soap and water once a week. Otherwise it does not need special care. To measure your peak flow, proper technique is very important. Follow the instructions that accompany your meter. Usually you must set the indicator to 0, take a deep  breath, and blow as quickly and forcefully as possible into the meter. Reset the meter and repeat the measurement twice; record your best result. If your test results put you in the "green" zone, this means your ability to move air is 80-100% of normal. No special treatment may be needed. If your test puts you in the "yellow" zone, you are between 50 and 80% of normal. Treatment should be started. Results in the "red" zone mean you are below 50% of normal and you should initiate treatment and call your caregiver right away. Document Released:  01/16/2005 Document Revised: 03/02/2012 Document Reviewed: 12/09/2005 [facility] Patient Information 2013 [place], [place].

## 2012-11-09 NOTE — Assessment & Plan Note (Signed)
Quit 5-6 months ago, encouraged the same

## 2012-11-11 ENCOUNTER — Telehealth: Payer: Self-pay

## 2012-11-11 DIAGNOSIS — S66912A Strain of unspecified muscle, fascia and tendon at wrist and hand level, left hand, initial encounter: Secondary | ICD-10-CM

## 2012-11-11 MED ORDER — HYDROCODONE-ACETAMINOPHEN 5-325 MG PO TABS: 1.0000 | ORAL_TABLET | Freq: Three times a day (TID) | ORAL | Status: AC | PRN

## 2012-11-11 NOTE — Telephone Encounter (Signed)
No problem, he can have the same norco 5/325 with same sig #60 with 1 refill and then if he tries the gel he can technically use both. The hope would be that he would just need less of the Norco if the gel helps

## 2012-11-11 NOTE — Telephone Encounter (Signed)
Patient informed and voiced understanding. RX faxed

## 2012-11-11 NOTE — Telephone Encounter (Signed)
Patient left a message stating that [facility] called him and its going to be $60 and with the Holidays coming he can't really afford that. Pt stated that the Hydrocodone worked well for his elbow and its only $10? Pt would like to know if this can be sent in for now and after the Holidays he will try the gel? Please advise?

## 2012-11-12 ENCOUNTER — Inpatient Hospital Stay (HOSPITAL_COMMUNITY): Admission: RE | Admit: 2012-11-12 | Payer: PRIVATE HEALTH INSURANCE | Source: Ambulatory Visit

## 2013-03-17 ENCOUNTER — Telehealth: Payer: Self-pay | Admitting: Family Medicine

## 2013-03-17 MED ORDER — HYDROCODONE-ACETAMINOPHEN 5-325 MG PO TABS: 1.0000 | ORAL_TABLET | Freq: Three times a day (TID) | ORAL | Status: DC | PRN

## 2013-03-17 NOTE — Telephone Encounter (Signed)
RX faxed to [facility] pharmacy per Cordelia Pen

## 2013-03-17 NOTE — Telephone Encounter (Signed)
Please advise refill? Last RX was wrote on 11-11-12 quantity 60 with 1 refill

## 2013-03-17 NOTE — Telephone Encounter (Signed)
OK to refill Hydrocodone with same strength, same sig #60, 1 rf

## 2013-04-28 ENCOUNTER — Telehealth: Payer: Self-pay | Admitting: Family Medicine

## 2013-04-28 DIAGNOSIS — I10 Essential (primary) hypertension: Secondary | ICD-10-CM

## 2013-04-28 NOTE — Telephone Encounter (Signed)
Refill- losartan potassium 50mg  tab. Take one tablet by mouth every day needs appointment for more refills. Qty 30 last fill 3.26.14

## 2013-04-29 MED ORDER — LOSARTAN POTASSIUM 50 MG PO TABS: 50.0000 mg | ORAL_TABLET | Freq: Every day | ORAL | Status: DC

## 2013-04-29 NOTE — Addendum Note (Signed)
Addended by: Marlene Lard on: 04/29/2013 11:41 AM   Modules accepted: Orders

## 2013-04-29 NOTE — Addendum Note (Signed)
Addended by: Court Joy on: 04/29/2013 03:39 PM   Modules accepted: Orders

## 2013-04-29 NOTE — Telephone Encounter (Signed)
Called patient to let him know that he needs to schedule an appointment to continue to receive refills. Patient said that he will look at his schedule then call us back.

## 2013-06-05 ENCOUNTER — Inpatient Hospital Stay (HOSPITAL_COMMUNITY)
Admission: EM | Admit: 2013-06-05 | Discharge: 2013-06-08 | DRG: 014 | Disposition: A | Discharge status: Home / Self Care | Payer: BC Managed Care – PPO | Attending: Internal Medicine | Admitting: Internal Medicine

## 2013-06-05 ENCOUNTER — Encounter (HOSPITAL_COMMUNITY): Payer: Self-pay | Admitting: *Deleted

## 2013-06-05 ENCOUNTER — Emergency Department (HOSPITAL_COMMUNITY): Payer: BC Managed Care – PPO

## 2013-06-05 DIAGNOSIS — H539 Unspecified visual disturbance: Secondary | ICD-10-CM

## 2013-06-05 DIAGNOSIS — M129 Arthropathy, unspecified: Secondary | ICD-10-CM | POA: Diagnosis present

## 2013-06-05 DIAGNOSIS — G8190 Hemiplegia, unspecified affecting unspecified side: Secondary | ICD-10-CM | POA: Diagnosis present

## 2013-06-05 DIAGNOSIS — F17200 Nicotine dependence, unspecified, uncomplicated: Secondary | ICD-10-CM | POA: Diagnosis present

## 2013-06-05 DIAGNOSIS — K219 Gastro-esophageal reflux disease without esophagitis: Secondary | ICD-10-CM | POA: Diagnosis present

## 2013-06-05 DIAGNOSIS — K429 Umbilical hernia without obstruction or gangrene: Secondary | ICD-10-CM

## 2013-06-05 DIAGNOSIS — M7701 Medial epicondylitis, right elbow: Secondary | ICD-10-CM

## 2013-06-05 DIAGNOSIS — F1010 Alcohol abuse, uncomplicated: Secondary | ICD-10-CM | POA: Diagnosis present

## 2013-06-05 DIAGNOSIS — R945 Abnormal results of liver function studies: Secondary | ICD-10-CM

## 2013-06-05 DIAGNOSIS — I4891 Unspecified atrial fibrillation: Secondary | ICD-10-CM | POA: Diagnosis present

## 2013-06-05 DIAGNOSIS — E669 Obesity, unspecified: Secondary | ICD-10-CM | POA: Diagnosis present

## 2013-06-05 DIAGNOSIS — E785 Hyperlipidemia, unspecified: Secondary | ICD-10-CM | POA: Diagnosis present

## 2013-06-05 DIAGNOSIS — M1990 Unspecified osteoarthritis, unspecified site: Secondary | ICD-10-CM

## 2013-06-05 DIAGNOSIS — Z720 Tobacco use: Secondary | ICD-10-CM

## 2013-06-05 DIAGNOSIS — R0789 Other chest pain: Secondary | ICD-10-CM

## 2013-06-05 DIAGNOSIS — E663 Overweight: Secondary | ICD-10-CM

## 2013-06-05 DIAGNOSIS — I6340 Cerebral infarction due to embolism of unspecified cerebral artery: Secondary | ICD-10-CM | POA: Diagnosis not present

## 2013-06-05 DIAGNOSIS — I10 Essential (primary) hypertension: Secondary | ICD-10-CM

## 2013-06-05 DIAGNOSIS — I639 Cerebral infarction, unspecified: Secondary | ICD-10-CM

## 2013-06-05 DIAGNOSIS — Z0000 Encounter for general adult medical examination without abnormal findings: Secondary | ICD-10-CM

## 2013-06-05 DIAGNOSIS — Z6828 Body mass index (BMI) 28.0-28.9, adult: Secondary | ICD-10-CM

## 2013-06-05 DIAGNOSIS — F17201 Nicotine dependence, unspecified, in remission: Secondary | ICD-10-CM

## 2013-06-05 DIAGNOSIS — F1210 Cannabis abuse, uncomplicated: Secondary | ICD-10-CM | POA: Diagnosis present

## 2013-06-05 DIAGNOSIS — J209 Acute bronchitis, unspecified: Secondary | ICD-10-CM

## 2013-06-05 DIAGNOSIS — R4701 Aphasia: Secondary | ICD-10-CM | POA: Diagnosis present

## 2013-06-05 HISTORY — DX: Cerebral infarction, unspecified: I63.9

## 2013-06-05 HISTORY — DX: Unspecified atrial fibrillation: I48.91

## 2013-06-05 LAB — POCT I-STAT, CHEM 8
BUN: 25 mg/dL — ABNORMAL HIGH (ref 6–23)
Calcium, Ion: 1.15 mmol/L (ref 1.12–1.23)
Chloride: 107 mEq/L (ref 96–112)
Glucose, Bld: 92 mg/dL (ref 70–99)
HCT: 45 % (ref 39.0–52.0)
Potassium: 4.3 mEq/L (ref 3.5–5.1)

## 2013-06-05 LAB — CBC
HCT: 41 % (ref 39.0–52.0)
MCH: 31.3 pg (ref 26.0–34.0)
MCV: 87.2 fL (ref 78.0–100.0)
Platelets: 220 10*3/uL (ref 150–400)
RBC: 4.7 MIL/uL (ref 4.22–5.81)

## 2013-06-05 LAB — PROTIME-INR: Prothrombin Time: 13.5 seconds (ref 11.6–15.2)

## 2013-06-05 LAB — POCT I-STAT TROPONIN I: Troponin i, poc: 0.01 ng/mL (ref 0.00–0.08)

## 2013-06-05 LAB — URINALYSIS, ROUTINE W REFLEX MICROSCOPIC
Bilirubin Urine: NEGATIVE
Glucose, UA: NEGATIVE mg/dL
Ketones, ur: NEGATIVE mg/dL
Nitrite: NEGATIVE
Specific Gravity, Urine: 1.025 (ref 1.005–1.030)
pH: 5 (ref 5.0–8.0)

## 2013-06-05 LAB — DIFFERENTIAL
Eosinophils Absolute: 0.3 10*3/uL (ref 0.0–0.7)
Eosinophils Relative: 3 % (ref 0–5)
Lymphs Abs: 4.1 10*3/uL — ABNORMAL HIGH (ref 0.7–4.0)
Monocytes Absolute: 0.8 10*3/uL (ref 0.1–1.0)
Monocytes Relative: 8 % (ref 3–12)

## 2013-06-05 LAB — COMPREHENSIVE METABOLIC PANEL
BUN: 25 mg/dL — ABNORMAL HIGH (ref 6–23)
CO2: 22 mEq/L (ref 19–32)
Calcium: 9.5 mg/dL (ref 8.4–10.5)
Creatinine, Ser: 1.12 mg/dL (ref 0.50–1.35)
GFR calc Af Amer: 87 mL/min — ABNORMAL LOW (ref >90)
GFR calc non Af Amer: 75 mL/min — ABNORMAL LOW (ref >90)
Glucose, Bld: 89 mg/dL (ref 70–99)
Sodium: 138 mEq/L (ref 135–145)
Total Protein: 7.4 g/dL (ref 6.0–8.3)

## 2013-06-05 LAB — RAPID URINE DRUG SCREEN, HOSP PERFORMED
Benzodiazepines: NOT DETECTED
Cocaine: NOT DETECTED
Opiates: POSITIVE — AB

## 2013-06-05 LAB — TROPONIN I: Troponin I: <0.3 ng/mL (ref <0.30)

## 2013-06-05 NOTE — ED Notes (Signed)
Checked patients CBG (96)

## 2013-06-05 NOTE — ED Notes (Signed)
Pt noted to have increased ability reading stroke cards from previous assessments.

## 2013-06-05 NOTE — ED Provider Notes (Signed)
History     CSN: [id number]  Arrival date & time 06/05/13  2113   First MD Initiated Contact with Patient 06/05/13 2138      Chief Complaint  Patient presents with  . Transient Ischemic Attack  . Atrial Fibrillation    (Consider location/radiation/quality/duration/timing/severity/associated sxs/prior treatment) HPI This 52 year old male has a history of hypertension and currently smokes without any history of coronary artery disease documented in the past, atrial fibrillation in the past, or prior strokes, was feeling well eating dinner and was last known well at 7:30 tonight when he had sudden onset of right arm weakness numbness incoordination and expressive aphasia which lasted about 15 minutes, his symptoms are almost resolved but not quite completely resolved, he still is minimal difficulty using his right arm especially when he tries to perform text messages but he has no specific weakness or numbness to the arm anymore he does have some difficulty with word finding still has had no headache no lightheadedness no chest pain no palpitations no shortness breath no bowel pain no vomiting no bloody stools no recent illnesses no trauma and no history of atrial fibrillation, there is no treatment prior to arrival. Code stroke was activated upon my initial evaluation of the patient. Past Medical History  Diagnosis Date  . Allergy     seasonal  . Hypertension   . Overweight(278.02) 04/17/2011  . Arthritis 04/17/2011  . Reflux 04/17/2011  . HTN (hypertension) 04/17/2011  . Tobacco abuse 04/17/2011  . Acute bronchitis 04/17/2011  . Vitamin D deficiency 04/17/2011  . Hand crush injury 07/05/2011  . Umbilical hernia 07/05/2011  . Hyperlipidemia 07/16/2011  . Visual changes 07/16/2011  . a 03/12/2012  . Abnormal liver function 08/20/2012  . 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 Medial epicondylitis of right elbow 11/09/2012  . DOE (dyspnea on exertion) 11/09/2012    Past Surgical History   Procedure Laterality Date  . Vasectomy    . Knee surgery    . Carpal tunnel release      right, needed some nerve repair as well  . Right knee arthroscopy      Family History  Problem Relation Age of Onset  . Cancer Mother 63    breast- remission  . Fibromyalgia Mother   . Other Father     CHF- stent   . Heart attack Father   . Heart disease Maternal Uncle   . Heart disease Maternal Grandmother   . Heart disease Maternal Grandfather     History  Substance Use Topics  . Smoking status: Current Every Day Smoker -- 0.30 packs/day    Types: Cigarettes  . Smokeless tobacco: Never Used  . Alcohol Use: Yes     Comment: 24 beers weekly      Review of Systems 10 Systems reviewed and are negative for acute change except as noted in the HPI. Allergies  Review of patient's allergies indicates no known allergies.  Home Medications   No current outpatient prescriptions on file.  BP 121/74  Pulse 94  Temp(Src) 98.3 F (36.8 C) (Oral)  Resp 18  Ht 6\' 4"  (1.93 m)  Wt 234 lb 6.4 oz (106.323 kg)  BMI 28.54 kg/m2  SpO2 97%  Physical Exam  Nursing note and vitals reviewed. Constitutional: He is oriented to person, place, and time.  Awake, alert, nontoxic appearance with baseline speech for patient.  HENT:  Head: Atraumatic.  Mouth/Throat: No oropharyngeal exudate.  Eyes: EOM are normal. Pupils are equal, round, and reactive  to light. Right eye exhibits no discharge. Left eye exhibits no discharge.  Neck: Neck supple.  Cardiovascular:  No murmur heard. Mildly tachycardic irregularly irregular rate and rhythm  Pulmonary/Chest: Effort normal and breath sounds normal. No stridor. No respiratory distress. He has no wheezes. He has no rales. He exhibits no tenderness.  Abdominal: Soft. Bowel sounds are normal. He exhibits no mass. There is no tenderness. There is no rebound.  Musculoskeletal: He exhibits no edema and no tenderness.  Baseline ROM, moves extremities with no  obvious new focal weakness.  Lymphadenopathy:    He has no cervical adenopathy.  Neurological: He is alert and oriented to person, place, and time.  Awake, alert, cooperative and aware of situation; motor strength bilaterally; sensation normal to light touch bilaterally; peripheral visual fields full to confrontation; no facial asymmetry; tongue midline; major cranial nerves appear intact; no pronator drift, normal finger to nose bilaterally, minimal difficulty with word finding and use of his right hand when texting  Skin: No rash noted.  Psychiatric: He has a normal mood and affect.    ED Course  Procedures (including critical care time) ECG: History fibrillation, ventricular rate 108, normal axis, no acute ischemic changes noted, no comparison ECG available   Neuro recommended watch Pt in ED until midnight to make sure no progresion of Sxs, Pt feels improved in ED, [facility] paged (PCP [place]). 0005  ~0030 d/w [facility] and placed Temp Admit Orders ~0040 d/w Pt who had wanted to leave AMA, appeared to have capacity to do so, but d/w Pt who will now stay and not leave AMA.  D/w Neuro recs MRI in AM may help determine if anticoagulation started; no anticoagulation necessary now and do not need emergent MRI tonight. 0100  Labs Reviewed  ETHANOL - Abnormal; Notable for the following:    Alcohol, Ethyl (B) 20 (*)    All other components within normal limits  CBC - Abnormal; Notable for the following:    WBC 10.7 (*)    All other components within normal limits  DIFFERENTIAL - Abnormal; Notable for the following:    Lymphs Abs 4.1 (*)    All other components within normal limits  COMPREHENSIVE METABOLIC PANEL - Abnormal; Notable for the following:    BUN 25 (*)    AST 76 (*)    ALT 108 (*)    GFR calc non Af Amer 75 (*)    GFR calc Af Amer 87 (*)    All other components within normal limits  URINE RAPID DRUG SCREEN (HOSP PERFORMED) - Abnormal; Notable for the following:    Opiates  POSITIVE (*)    Tetrahydrocannabinol POSITIVE (*)    All other components within normal limits  LIPID PANEL - Abnormal; Notable for the following:    HDL 31 (*)    LDL Cholesterol 132 (*)    All other components within normal limits  POCT I-STAT, CHEM 8 - Abnormal; Notable for the following:    BUN 25 (*)    All other components within normal limits  PROTIME-INR  APTT  TROPONIN I  URINALYSIS, ROUTINE W REFLEX MICROSCOPIC  HEMOGLOBIN A1C  POCT I-STAT TROPONIN I   Dg Chest 2 View  06/06/2013   *RADIOLOGY REPORT*  Clinical Data: Stroke.  CHEST - 2 VIEW  Comparison: 08/17/2012  Findings: Two views of the chest were obtained.  Lungs are clear without airspace disease or edema. Heart and mediastinum are within normal limits.  The trachea is midline and no  evidence for pleural effusions.  IMPRESSION: No acute cardiopulmonary disease.   Original Report Authenticated By: Richarda Overlie, M.D.   Ct Head Wo Contrast  06/05/2013   *RADIOLOGY REPORT*  Clinical Data: Transient right arm weakness and facial weakness. New onset reading and spelling difficulty.  CT HEAD WITHOUT CONTRAST  Technique:  Contiguous axial images were obtained from the base of the skull through the vertex without contrast.  Comparison: None.  Findings: Minimally enlarged ventricles and subarachnoid spaces. Minimal patchy white matter low density in both cerebral hemispheres.  No intracranial hemorrhage, mass lesion or CT evidence of acute infarction.  Unremarkable bones.  Mucosal thickening involving all of the sinuses as well as a large left maxillary sinus retention cyst.  Small calcification in the posterior right globe at the optic nerve.  IMPRESSION:  1.  No acute abnormality. 2.  Minimal atrophy and minimal chronic small vessel white matter ischemic changes in both cerebral hemispheres. 3.  Mild chronic pansinusitis. 4.  Tiny posterior globe calcification on the right at the optic nerve  These results were called by telephone on  06/05/2013 at 2200 hours to Dr. Fonnie Jarvis, who verbally acknowledged these results.   Original Report Authenticated By: Beckie Salts, M.D.   Mr Brain Wo Contrast  06/06/2013   *RADIOLOGY REPORT*  Clinical Data:  Right-sided weakness.  Hypertensive hyperlipidemic overweight patients with history of tobacco use.  MRI BRAIN WITHOUT CONTRAST MRA HEAD WITHOUT CONTRAST  Technique: Multiplanar, multiecho pulse sequences of the brain and surrounding structures were obtained according to standard protocol without intravenous contrast.  Angiographic images of the head were obtained using MRA technique without contrast.  Comparison: 06/05/2013 CT.  No comparison MR.  MRI HEAD  Findings:  Moderate size acute partially hemorrhagic left parietal lobe infarct. There may be an acute and subacute components at this level with subacute component hemorrhagic.  No intracranial mass lesion detected on this unenhanced exam.  No hydrocephalus.  Diminutive size right vertebral artery please see below.  Prominent paranasal sinus mucosal thickening with polypoid appearance maxillary sinuses greater on the left with polypoid lesion measures up to 3 cm.  Cervical medullary junction, pituitary region, pineal region and orbital structures unremarkable.  IMPRESSION:  Moderate size acute partially hemorrhagic left parietal lobe infarct. There may be an acute and subacute components at this level with subacute component hemorrhagic.  Diminutive size right vertebral artery please see below.  Prominent paranasal sinus mucosal thickening with polypoid appearance maxillary sinuses greater on the left with polypoid lesion measures up to 3 cm.  MRA HEAD  Findings: Right internal carotid artery is slightly smaller than the left which may be related to right hypoplastic A1 segment configuration rather than proximal right internal carotid artery stenosis although the latter is not entirely excluded.  Fetal type origin posterior cerebral arteries bilaterally.   Mild narrowing right internal carotid artery supraclinoid segment.  Mild irregularity M1 segment right middle cerebral artery without high-grade stenosis.  Middle cerebral artery branch vessel irregularity bilaterally.  No significant stenosis M1 segment left middle cerebral artery.  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  No significant stenosis of the left vertebral artery or basilar artery.  Nonvisualization AICAs.  Narrowed irregular superior cerebellar artery bilaterally.  Mild narrowing P1 segment left posterior cerebral artery. Decreased caliber of the right posterior cerebral artery compared to the left. Posterior cerebral artery branch vessel irregularity and narrowing greater on the right.  No aneurysm or vascular malformation noted.  IMPRESSION: Intracranial  atherosclerotic type changes as noted above.  Critical Value/emergent results were called by telephone at the time of interpretation on 06/06/2013 at 9:42 a.m. to Vernona Rieger patient's nurse, who verbally acknowledged these results.   Original Report Authenticated By: Lacy Duverney, M.D.   Mr Mra Head/brain Wo Cm  06/06/2013   *RADIOLOGY REPORT*  Clinical Data:  Right-sided weakness.  Hypertensive hyperlipidemic overweight patients with history of tobacco use.  MRI BRAIN WITHOUT CONTRAST MRA HEAD WITHOUT CONTRAST  Technique: Multiplanar, multiecho pulse sequences of the brain and surrounding structures were obtained according to standard protocol without intravenous contrast.  Angiographic images of the head were obtained using MRA technique without contrast.  Comparison: 06/05/2013 CT.  No comparison MR.  MRI HEAD  Findings:  Moderate size acute partially hemorrhagic left parietal lobe infarct. There may be an acute and subacute components at this level with subacute component hemorrhagic.  No intracranial mass lesion detected on this unenhanced exam.  No hydrocephalus.  Diminutive size right vertebral  artery please see below.  Prominent paranasal sinus mucosal thickening with polypoid appearance maxillary sinuses greater on the left with polypoid lesion measures up to 3 cm.  Cervical medullary junction, pituitary region, pineal region and orbital structures unremarkable.  IMPRESSION:  Moderate size acute partially hemorrhagic left parietal lobe infarct. There may be an acute and subacute components at this level with subacute component hemorrhagic.  Diminutive size right vertebral artery please see below.  Prominent paranasal sinus mucosal thickening with polypoid appearance maxillary sinuses greater on the left with polypoid lesion measures up to 3 cm.  MRA HEAD  Findings: Right internal carotid artery is slightly smaller than the left which may be related to right hypoplastic A1 segment configuration rather than proximal right internal carotid artery stenosis although the latter is not entirely excluded.  Fetal type origin posterior cerebral arteries bilaterally.  Mild narrowing right internal carotid artery supraclinoid segment.  Mild irregularity M1 segment right middle cerebral artery without high-grade stenosis.  Middle cerebral artery branch vessel irregularity bilaterally.  No significant stenosis M1 segment left middle cerebral artery.  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  No significant stenosis of the left vertebral artery or basilar artery.  Nonvisualization AICAs.  Narrowed irregular superior cerebellar artery bilaterally.  Mild narrowing P1 segment left posterior cerebral artery. Decreased caliber of the right posterior cerebral artery compared to the left. Posterior cerebral artery branch vessel irregularity and narrowing greater on the right.  No aneurysm or vascular malformation noted.  IMPRESSION: Intracranial atherosclerotic type changes as noted above.  Critical Value/emergent results were called by telephone at the time of interpretation on  06/06/2013 at 9:42 a.m. to Vernona Rieger patient's nurse, who verbally acknowledged these results.   Original Report Authenticated By: Lacy Duverney, M.D.     1. CVA (cerebral infarction)   2. Stroke   3. Atrial fibrillation   4. HTN (hypertension)   5. Hyperlipidemia   6. Tobacco abuse disorder       MDM  The patient appears reasonably stabilized for admission considering the current resources, flow, and capabilities available in the ED at this time, and I doubt any other [facility] requiring further screening and/or treatment in the ED prior to admission.        Hurman Horn, MD 06/06/13 1344

## 2013-06-05 NOTE — ED Notes (Signed)
While completing stroke swallow screen, pt was noted to have trouble spelling words while texting his wife.  Pt provided a reading sample and was noted to have difficulty reading.  Dr Fonnie Jarvis made aware

## 2013-06-05 NOTE — ED Notes (Signed)
Per EMS.  Pt was witnessed by Wife seeing face "melt".  Pt was take to local [facility] and was symptom free 15 minutes after initial finding.  Pt was found to be in rapid A-fib by EMS and was given 20 mg of diltaziem.  Pt now has complains of no symptoms, and states he feels normal.

## 2013-06-05 NOTE — Consult Note (Signed)
Neurology Consultation Reason for Consult: Right-sided weakness with aphasia Referring Physician: Jinger Neighbors.  CC: Right-sided weakness with aphasia  History is obtained from: Patient, wife  HPI: Marc Hansen is a 52 y.o. male with a history of hypertension who presents after a transient episode of right-sided weakness and aphasia. On the ED physician's exam, it was noted that he had not completely resolved and therefore code stroke was activated. He states that when he was eating dinner, he had right arm weakness and difficulty speaking the lasted for about 15 minutes. This is mostly resolved, but he still has difficulty with texting and reading.    LKW: 7:30 PM tpa given: no, symptoms almost completely resolved NIHSS: 0 MRS: 0   ROS: A 14 point ROS was performed and is negative except as noted in the HPI.  Past Medical History  Diagnosis Date  . Allergy     seasonal  . GERD (gastroesophageal reflux disease)   . Hypertension   . Overweight(278.02) 04/17/2011  . Arthritis 04/17/2011  . Reflux 04/17/2011  . HTN (hypertension) 04/17/2011  . Tobacco abuse 04/17/2011  . Acute bronchitis 04/17/2011  . Vitamin D deficiency 04/17/2011  . Hand crush injury 07/05/2011  . Umbilical hernia 07/05/2011  . Hyperlipidemia 07/16/2011  . Visual changes 07/16/2011  . a 03/12/2012  . Abnormal liver function 08/20/2012  . DOE (dyspnea on exertion) 11/09/2012  . 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 Medial epicondylitis of right elbow 11/09/2012    Family History: Multiple family members with heart disease No history of stroke  Social History: Tob: Positive for smoking, but trying to quit  Exam: Current vital signs: BP 136/114  Pulse 98  Temp(Src) 98.2 F (36.8 C) (Oral)  Resp 13  Ht 6\' 4"  (1.93 m)  Wt 117.935 kg (260 lb)  BMI 31.66 kg/m2  SpO2 98% Vital signs in last 24 hours: Temp:  [98.2 F (36.8 C)] 98.2 F (36.8 C) (06/14 2121) Pulse Rate:  [98] 98 (06/14 2121) Resp:  [13] 13  (06/14 2121) BP: (136)/(114) 136/114 mmHg (06/14 2121) SpO2:  [98 %] 98 % (06/14 2121) Weight:  [117.935 kg (260 lb)] 117.935 kg (260 lb) (06/14 2121)  General: In bed, no apparent distress CV: Irregular Mental Status: Patient is awake, alert, oriented to person, place, month, year, and situation. Immediate and remote memory are intact. Patient is able to give a clear and coherent history. Able to spell world backwards without difficulty Able to give # of quarters in $2.75 He is unable to read sentences, making paraphasic errors. Cranial Nerves: II: Visual Fields are full. Pupils are equal, round, and reactive to light.  Discs are difficult to visualize. III,IV, VI: EOMI without ptosis or diploplia.  V: Facial sensation is symmetric to temperature VII: Facial movement is symmetric.  VIII: hearing is intact to voice X: Uvula elevates symmetrically XI: Shoulder shrug is symmetric. XII: tongue is midline without atrophy or fasciculations.  Motor: Tone is normal. Bulk is normal. 5/5 strength was present in all four extremities.  Sensory: Sensation is symmetric to light touch and temperature in the arms and legs. Deep Tendon Reflexes: 2+ and symmetric in the biceps and patellae.  Plantars: Toes are downgoing bilaterally.  Cerebellar: FNF and HKS are intact bilaterally Gait: Not assessed due to acute nature of evaluation and multiple medical monitors in ED setting.  I have reviewed labs in epic and the results pertinent to this consultation are: CBC - mild leukocytosis  I have reviewed the images  obtained:CT head - no acute findings.   Impression: 52 year old male with a history of hypertension who presents in atrial fibrillation with transient right-sided weakness and aphasia. He has not completely cleared and therefore his last known well time remains 7:30 PM. I suspect that he has a relatively small stroke, but an MRI would be helpful to determine when anticoagulation would be  safe.  Recommendations: 1. HgbA1c, fasting lipid panel 2. MRI, MRA  of the brain without contrast 3. Frequent neuro checks 4. Echocardiogram 5. Carotid dopplers 6. Prophylactic therapy-Antiplatelet med: Aspirin - dose 325mg , depending on MRI results, likely could start anticoagulation early if small stroke.  7. Risk factor modification 8. Telemetry monitoring 9. PT consult, OT consult, Speech consult 10. Permissive hypertension    Ritta Slot, MD [facility] [phone]  If 7pm- 7am, please page neurology on call at [phone].

## 2013-06-06 ENCOUNTER — Observation Stay (HOSPITAL_COMMUNITY): Payer: BC Managed Care – PPO

## 2013-06-06 ENCOUNTER — Encounter (HOSPITAL_COMMUNITY): Payer: Self-pay | Admitting: *Deleted

## 2013-06-06 DIAGNOSIS — Z720 Tobacco use: Secondary | ICD-10-CM | POA: Diagnosis present

## 2013-06-06 DIAGNOSIS — M129 Arthropathy, unspecified: Secondary | ICD-10-CM | POA: Diagnosis present

## 2013-06-06 DIAGNOSIS — I6340 Cerebral infarction due to embolism of unspecified cerebral artery: Secondary | ICD-10-CM | POA: Diagnosis present

## 2013-06-06 DIAGNOSIS — I10 Essential (primary) hypertension: Secondary | ICD-10-CM | POA: Diagnosis present

## 2013-06-06 DIAGNOSIS — E669 Obesity, unspecified: Secondary | ICD-10-CM | POA: Diagnosis present

## 2013-06-06 DIAGNOSIS — I4891 Unspecified atrial fibrillation: Secondary | ICD-10-CM

## 2013-06-06 DIAGNOSIS — K219 Gastro-esophageal reflux disease without esophagitis: Secondary | ICD-10-CM | POA: Diagnosis present

## 2013-06-06 DIAGNOSIS — R4701 Aphasia: Secondary | ICD-10-CM | POA: Diagnosis present

## 2013-06-06 DIAGNOSIS — I639 Cerebral infarction, unspecified: Secondary | ICD-10-CM | POA: Diagnosis present

## 2013-06-06 DIAGNOSIS — I6350 Cerebral infarction due to unspecified occlusion or stenosis of unspecified cerebral artery: Secondary | ICD-10-CM

## 2013-06-06 DIAGNOSIS — F17200 Nicotine dependence, unspecified, uncomplicated: Secondary | ICD-10-CM

## 2013-06-06 DIAGNOSIS — F1010 Alcohol abuse, uncomplicated: Secondary | ICD-10-CM | POA: Diagnosis present

## 2013-06-06 DIAGNOSIS — E785 Hyperlipidemia, unspecified: Secondary | ICD-10-CM | POA: Diagnosis present

## 2013-06-06 DIAGNOSIS — G8190 Hemiplegia, unspecified affecting unspecified side: Secondary | ICD-10-CM | POA: Diagnosis present

## 2013-06-06 DIAGNOSIS — F1210 Cannabis abuse, uncomplicated: Secondary | ICD-10-CM | POA: Diagnosis present

## 2013-06-06 DIAGNOSIS — Z6828 Body mass index (BMI) 28.0-28.9, adult: Secondary | ICD-10-CM | POA: Diagnosis not present

## 2013-06-06 LAB — LIPID PANEL
HDL: 31 mg/dL — ABNORMAL LOW (ref >39)
Triglycerides: 119 mg/dL (ref <150)

## 2013-06-06 LAB — HEMOGLOBIN A1C
Hgb A1c MFr Bld: 5.8 % — ABNORMAL HIGH (ref <5.7)
Mean Plasma Glucose: 120 mg/dL — ABNORMAL HIGH (ref <117)

## 2013-06-06 MED ORDER — LORAZEPAM 2 MG/ML IJ SOLN: 0.0000 mg | Freq: Two times a day (BID) | INTRAMUSCULAR | Status: DC

## 2013-06-06 MED ORDER — ALBUTEROL SULFATE (5 MG/ML) 0.5% IN NEBU: 2.5000 mg | INHALATION_SOLUTION | Freq: Four times a day (QID) | RESPIRATORY_TRACT | Status: DC | PRN

## 2013-06-06 MED ORDER — VITAMIN D3 25 MCG (1000 UNIT) PO TABS
1000.0000 [IU] | ORAL_TABLET | Freq: Every day | ORAL | Status: DC
  Administered 2013-06-06 – 2013-06-08 (×3): 1000 [IU] via ORAL
  Filled 2013-06-06 (×3): qty 1

## 2013-06-06 MED ORDER — OFF THE BEAT BOOK
Freq: Once | Status: AC
  Administered 2013-06-06: 06:00:00
  Filled 2013-06-06: qty 1

## 2013-06-06 MED ORDER — PANTOPRAZOLE SODIUM 40 MG PO TBEC
40.0000 mg | DELAYED_RELEASE_TABLET | Freq: Every day | ORAL | Status: DC
  Administered 2013-06-06 – 2013-06-08 (×3): 40 mg via ORAL
  Filled 2013-06-06: qty 1

## 2013-06-06 MED ORDER — THIAMINE HCL 100 MG/ML IJ SOLN
100.0000 mg | Freq: Every day | INTRAMUSCULAR | Status: DC
  Filled 2013-06-06 (×3): qty 1

## 2013-06-06 MED ORDER — ADULT MULTIVITAMIN W/MINERALS CH
1.0000 | ORAL_TABLET | Freq: Every day | ORAL | Status: DC
  Administered 2013-06-06 – 2013-06-08 (×3): 1 via ORAL
  Filled 2013-06-06 (×3): qty 1

## 2013-06-06 MED ORDER — LORAZEPAM 2 MG/ML IJ SOLN: 0.0000 mg | Freq: Four times a day (QID) | INTRAMUSCULAR | Status: AC

## 2013-06-06 MED ORDER — SENNOSIDES-DOCUSATE SODIUM 8.6-50 MG PO TABS
1.0000 | ORAL_TABLET | Freq: Every evening | ORAL | Status: DC | PRN
  Filled 2013-06-06: qty 1

## 2013-06-06 MED ORDER — ASPIRIN 325 MG PO TABS
325.0000 mg | ORAL_TABLET | Freq: Every day | ORAL | Status: DC
  Administered 2013-06-06 – 2013-06-07 (×2): 325 mg via ORAL
  Filled 2013-06-06 (×3): qty 1

## 2013-06-06 MED ORDER — NITROGLYCERIN 0.4 MG SL SUBL
0.4000 mg | SUBLINGUAL_TABLET | SUBLINGUAL | Status: DC | PRN
Start: 2013-06-06 — End: 2013-06-08

## 2013-06-06 MED ORDER — STROKE: EARLY STAGES OF RECOVERY BOOK
Freq: Once | Status: AC
  Administered 2013-06-06: 06:00:00
  Filled 2013-06-06: qty 1

## 2013-06-06 MED ORDER — SODIUM CHLORIDE 0.9 % IV SOLN
INTRAVENOUS | Status: AC
  Administered 2013-06-06: 03:00:00 via INTRAVENOUS

## 2013-06-06 MED ORDER — LORAZEPAM 1 MG PO TABS
1.0000 mg | ORAL_TABLET | Freq: Four times a day (QID) | ORAL | Status: DC | PRN
  Administered 2013-06-06: 1 mg via ORAL
  Filled 2013-06-06: qty 1

## 2013-06-06 MED ORDER — LOSARTAN POTASSIUM 50 MG PO TABS
50.0000 mg | ORAL_TABLET | Freq: Every day | ORAL | Status: DC
  Administered 2013-06-06 – 2013-06-08 (×3): 50 mg via ORAL
  Filled 2013-06-06 (×3): qty 1

## 2013-06-06 MED ORDER — ASPIRIN 300 MG RE SUPP
300.0000 mg | Freq: Every day | RECTAL | Status: DC
  Filled 2013-06-06 (×3): qty 1

## 2013-06-06 MED ORDER — LORAZEPAM 2 MG/ML IJ SOLN: 1.0000 mg | Freq: Four times a day (QID) | INTRAMUSCULAR | Status: DC | PRN

## 2013-06-06 MED ORDER — OMEGA-3-ACID ETHYL ESTERS 1 G PO CAPS
2.0000 g | ORAL_CAPSULE | Freq: Two times a day (BID) | ORAL | Status: DC
  Administered 2013-06-06 – 2013-06-08 (×5): 2 g via ORAL
  Filled 2013-06-06 (×6): qty 2

## 2013-06-06 MED ORDER — ALBUTEROL SULFATE HFA 108 (90 BASE) MCG/ACT IN AERS
2.0000 | INHALATION_SPRAY | Freq: Four times a day (QID) | RESPIRATORY_TRACT | Status: DC | PRN
  Administered 2013-06-06: 2 via RESPIRATORY_TRACT
  Filled 2013-06-06: qty 6.7

## 2013-06-06 MED ORDER — VITAMIN B-1 100 MG PO TABS
100.0000 mg | ORAL_TABLET | Freq: Every day | ORAL | Status: DC
  Administered 2013-06-06 – 2013-06-08 (×3): 100 mg via ORAL
  Filled 2013-06-06 (×3): qty 1

## 2013-06-06 MED ORDER — DILTIAZEM HCL 30 MG PO TABS
30.0000 mg | ORAL_TABLET | Freq: Four times a day (QID) | ORAL | Status: DC
  Administered 2013-06-06 – 2013-06-08 (×6): 30 mg via ORAL
  Filled 2013-06-06 (×13): qty 1

## 2013-06-06 MED ORDER — FOLIC ACID 1 MG PO TABS
1.0000 mg | ORAL_TABLET | Freq: Every day | ORAL | Status: DC
  Administered 2013-06-06 – 2013-06-08 (×3): 1 mg via ORAL
  Filled 2013-06-06 (×3): qty 1

## 2013-06-06 MED ORDER — OMEGA-3 FATTY ACIDS 1000 MG PO CAPS: 2.0000 g | ORAL_CAPSULE | Freq: Two times a day (BID) | ORAL | Status: DC

## 2013-06-06 MED ORDER — ENOXAPARIN SODIUM 40 MG/0.4ML ~~LOC~~ SOLN
40.0000 mg | SUBCUTANEOUS | Status: DC
  Administered 2013-06-06 – 2013-06-07 (×2): 40 mg via SUBCUTANEOUS
  Filled 2013-06-06 (×3): qty 0.4

## 2013-06-06 MED ORDER — SALINE SPRAY 0.65 % NA SOLN
1.0000 | NASAL | Status: DC | PRN
  Administered 2013-06-06: 1 via NASAL
  Filled 2013-06-06: qty 44

## 2013-06-06 MED ORDER — ASPIRIN 325 MG PO TABS
325.0000 mg | ORAL_TABLET | Freq: Once | ORAL | Status: AC
  Administered 2013-06-06: 325 mg via ORAL
  Filled 2013-06-06: qty 1

## 2013-06-06 NOTE — Progress Notes (Addendum)
[facility] PROGRESS NOTE I have seen and examined pt who is a 52 yo HTN, alcohol/plysubstance abuse admitted this am by Dr Lovell Sheehan with new onset AFib and CVA. Pt initially converted to NSR after he receiving cardizem 20mg  bolus per ems, following admission went back into AFIB with rate up to the 120s>> oral cardizem started and now rate controlled. PT states speech/word finding dificulties and R. Sided weakness now completely resolved. MRI shows Moderate size acute partially hemorrhagic left parietal lobe infarct - discussed with Dr Anne Hahn and he states ok to continue ASA, and lovenox for DVT prophylaxis, and in 3-5days convert to xarelto or pradaxa. Will follow and otherwise continue current management plan as per Dr Lovell Sheehan.     [name]  [facility] Pager [phone]. If 7PM-7AM, please contact night-coverage at [email or web address], password [facility] 06/06/2013, 8:14 AM  LOS: 1 day

## 2013-06-06 NOTE — Progress Notes (Signed)
Stroke Team Progress Note  HISTORY Marc Hansen is a 52 y.o. male with a history of hypertension who presents after a transient episode of right-sided weakness and aphasia. On the ED physician's exam, it was noted that he had not completely resolved and therefore code stroke was activated. He states that when he was eating dinner, he had right arm weakness and difficulty speaking the lasted for about 15 minutes. This is mostly resolved, but he still has difficulty with texting and reading.   Patient was not a TPA candidate secondary to minimal deficit.  SUBJECTIVE His girlfriend is at bedside. The patient reports that he is at or near baseline.  OBJECTIVE Most recent Vital Signs: Filed Vitals:   06/06/13 0400 06/06/13 0600 06/06/13 0700 06/06/13 0800  BP: 122/87 115/81 134/108 136/102  Pulse: 94 111 97 80  Temp:  98 F (36.7 C)  97.8 F (36.6 C)  TempSrc:  Oral  Oral  Resp: 18 18  20   Height:      Weight:      SpO2: 97% 98%  97%   CBG (last 3)  No results found for this basename: GLUCAP,  in the last 72 hours  IV Fluid Intake:     MEDICATIONS  . sodium chloride   Intravenous STAT  . aspirin  300 mg Rectal Daily   Or  . aspirin  325 mg Oral Daily  . cholecalciferol  1,000 Units Oral Daily  . diltiazem  30 mg Oral Q6H  . enoxaparin (LOVENOX) injection  40 mg Subcutaneous Q24H  . folic acid  1 mg Oral Daily  . LORazepam  0-4 mg Intravenous Q6H   Followed by  . [START ON 06/08/2013] LORazepam  0-4 mg Intravenous Q12H  . losartan  50 mg Oral Daily  . multivitamin with minerals  1 tablet Oral Daily  . omega-3 acid ethyl esters  2 g Oral BID  . pantoprazole  40 mg Oral Daily  . thiamine  100 mg Oral Daily   Or  . thiamine  100 mg Intravenous Daily   PRN:  albuterol, albuterol, LORazepam, LORazepam, nitroGLYCERIN, senna-docusate, sodium chloride  Diet:  Cardiac thin liquids Activity:  Up with assistance DVT Prophylaxis:   Lovenox CLINICALLY SIGNIFICANT STUDIES Basic  Metabolic Panel:  Recent Labs Lab 06/05/13 2201 06/05/13 2216  NA 138 139  K 4.2 4.3  CL 103 107  CO2 22  --   GLUCOSE 89 92  BUN 25* 25*  CREATININE 1.12 1.30  CALCIUM 9.5  --    Liver Function Tests:  Recent Labs Lab 06/05/13 2201  AST 76*  ALT 108*  ALKPHOS 73  BILITOT 0.5  PROT 7.4  ALBUMIN 4.5   CBC:  Recent Labs Lab 06/05/13 2201 06/05/13 2216  WBC 10.7*  --   NEUTROABS 5.4  --   HGB 14.7 15.3  HCT 41.0 45.0  MCV 87.2  --   PLT 220  --    Coagulation:  Recent Labs Lab 06/05/13 2201  LABPROT 13.5  INR 1.04   Cardiac Enzymes:  Recent Labs Lab 06/05/13 2201  TROPONINI <0.30   Urinalysis:  Recent Labs Lab 06/05/13 2227  COLORURINE YELLOW  LABSPEC 1.025  PHURINE 5.0  GLUCOSEU NEGATIVE  HGBUR NEGATIVE  BILIRUBINUR NEGATIVE  KETONESUR NEGATIVE  PROTEINUR NEGATIVE  UROBILINOGEN 0.2  NITRITE NEGATIVE  LEUKOCYTESUR NEGATIVE   Lipid Panel    Component Value Date/Time   CHOL 187 06/06/2013 0425   TRIG 119 06/06/2013 0425   HDL  31* 06/06/2013 0425   CHOLHDL 6.0 06/06/2013 0425   VLDL 24 06/06/2013 0425   LDLCALC 132* 06/06/2013 0425   HgbA1C  No results found for this basename: HGBA1C    Urine Drug Screen:     Component Value Date/Time   LABOPIA POSITIVE* 06/05/2013 2227   COCAINSCRNUR NONE DETECTED 06/05/2013 2227   LABBENZ NONE DETECTED 06/05/2013 2227   AMPHETMU NONE DETECTED 06/05/2013 2227   THCU POSITIVE* 06/05/2013 2227   LABBARB NONE DETECTED 06/05/2013 2227    Alcohol Level:  Recent Labs Lab 06/05/13 2201  ETH 20*    Dg Chest 2 View  06/06/2013   *RADIOLOGY REPORT*  Clinical Data: Stroke.  CHEST - 2 VIEW  Comparison: 08/17/2012  Findings: Two views of the chest were obtained.  Lungs are clear without airspace disease or edema. Heart and mediastinum are within normal limits.  The trachea is midline and no evidence for pleural effusions.  IMPRESSION: No acute cardiopulmonary disease.   Original Report Authenticated By: Richarda Overlie,  M.D.   Ct Head Wo Contrast  06/05/2013   *RADIOLOGY REPORT*  Clinical Data: Transient right arm weakness and facial weakness. New onset reading and spelling difficulty.  CT HEAD WITHOUT CONTRAST  Technique:  Contiguous axial images were obtained from the base of the skull through the vertex without contrast.  Comparison: None.  Findings: Minimally enlarged ventricles and subarachnoid spaces. Minimal patchy white matter low density in both cerebral hemispheres.  No intracranial hemorrhage, mass lesion or CT evidence of acute infarction.  Unremarkable bones.  Mucosal thickening involving all of the sinuses as well as a large left maxillary sinus retention cyst.  Small calcification in the posterior right globe at the optic nerve.  IMPRESSION:  1.  No acute abnormality. 2.  Minimal atrophy and minimal chronic small vessel white matter ischemic changes in both cerebral hemispheres. 3.  Mild chronic pansinusitis. 4.  Tiny posterior globe calcification on the right at the optic nerve  These results were called by telephone on 06/05/2013 at 2200 hours to Dr. Fonnie Jarvis, who verbally acknowledged these results.   Original Report Authenticated By: Beckie Salts, M.D.   Mr Brain Wo Contrast  06/06/2013   *RADIOLOGY REPORT*  Clinical Data:  Right-sided weakness.  Hypertensive hyperlipidemic overweight patients with history of tobacco use.  MRI BRAIN WITHOUT CONTRAST MRA HEAD WITHOUT CONTRAST  Technique: Multiplanar, multiecho pulse sequences of the brain and surrounding structures were obtained according to standard protocol without intravenous contrast.  Angiographic images of the head were obtained using MRA technique without contrast.  Comparison: 06/05/2013 CT.  No comparison MR.  MRI HEAD  Findings:  Moderate size acute partially hemorrhagic left parietal lobe infarct. There may be an acute and subacute components at this level with subacute component hemorrhagic.  No intracranial mass lesion detected on this unenhanced  exam.  No hydrocephalus.  Diminutive size right vertebral artery please see below.  Prominent paranasal sinus mucosal thickening with polypoid appearance maxillary sinuses greater on the left with polypoid lesion measures up to 3 cm.  Cervical medullary junction, pituitary region, pineal region and orbital structures unremarkable.  IMPRESSION:  Moderate size acute partially hemorrhagic left parietal lobe infarct. There may be an acute and subacute components at this level with subacute component hemorrhagic.  Diminutive size right vertebral artery please see below.  Prominent paranasal sinus mucosal thickening with polypoid appearance maxillary sinuses greater on the left with polypoid lesion measures up to 3 cm.  MRA HEAD  Findings: Right internal carotid  artery is slightly smaller than the left which may be related to right hypoplastic A1 segment configuration rather than proximal right internal carotid artery stenosis although the latter is not entirely excluded.  Fetal type origin posterior cerebral arteries bilaterally.  Mild narrowing right internal carotid artery supraclinoid segment.  Mild irregularity M1 segment right middle cerebral artery without high-grade stenosis.  Middle cerebral artery branch vessel irregularity bilaterally.  No significant stenosis M1 segment left middle cerebral artery.  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  No significant stenosis of the left vertebral artery or basilar artery.  Nonvisualization AICAs.  Narrowed irregular superior cerebellar artery bilaterally.  Mild narrowing P1 segment left posterior cerebral artery. Decreased caliber of the right posterior cerebral artery compared to the left. Posterior cerebral artery branch vessel irregularity and narrowing greater on the right.  No aneurysm or vascular malformation noted.  IMPRESSION: Intracranial atherosclerotic type changes as noted above.  Critical Value/emergent results  were called by telephone at the time of interpretation on 06/06/2013 at 9:42 a.m. to Vernona Rieger patient's nurse, who verbally acknowledged these results.   Original Report Authenticated By: Lacy Duverney, M.D.   Mr Mra Head/brain Wo Cm  06/06/2013   *RADIOLOGY REPORT*  Clinical Data:  Right-sided weakness.  Hypertensive hyperlipidemic overweight patients with history of tobacco use.  MRI BRAIN WITHOUT CONTRAST MRA HEAD WITHOUT CONTRAST  Technique: Multiplanar, multiecho pulse sequences of the brain and surrounding structures were obtained according to standard protocol without intravenous contrast.  Angiographic images of the head were obtained using MRA technique without contrast.  Comparison: 06/05/2013 CT.  No comparison MR.  MRI HEAD  Findings:  Moderate size acute partially hemorrhagic left parietal lobe infarct. There may be an acute and subacute components at this level with subacute component hemorrhagic.  No intracranial mass lesion detected on this unenhanced exam.  No hydrocephalus.  Diminutive size right vertebral artery please see below.  Prominent paranasal sinus mucosal thickening with polypoid appearance maxillary sinuses greater on the left with polypoid lesion measures up to 3 cm.  Cervical medullary junction, pituitary region, pineal region and orbital structures unremarkable.  IMPRESSION:  Moderate size acute partially hemorrhagic left parietal lobe infarct. There may be an acute and subacute components at this level with subacute component hemorrhagic.  Diminutive size right vertebral artery please see below.  Prominent paranasal sinus mucosal thickening with polypoid appearance maxillary sinuses greater on the left with polypoid lesion measures up to 3 cm.  MRA HEAD  Findings: Right internal carotid artery is slightly smaller than the left which may be related to right hypoplastic A1 segment configuration rather than proximal right internal carotid artery stenosis although the latter is not  entirely excluded.  Fetal type origin posterior cerebral arteries bilaterally.  Mild narrowing right internal carotid artery supraclinoid segment.  Mild irregularity M1 segment right middle cerebral artery without high-grade stenosis.  Middle cerebral artery branch vessel irregularity bilaterally.  No significant stenosis M1 segment left middle cerebral artery.  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  No significant stenosis of the left vertebral artery or basilar artery.  Nonvisualization AICAs.  Narrowed irregular superior cerebellar artery bilaterally.  Mild narrowing P1 segment left posterior cerebral artery. Decreased caliber of the right posterior cerebral artery compared to the left. Posterior cerebral artery branch vessel irregularity and narrowing greater on the right.  No aneurysm or vascular malformation noted.  IMPRESSION: Intracranial atherosclerotic type changes as noted above.  Critical Value/emergent results  were called by telephone at the time of interpretation on 06/06/2013 at 9:42 a.m. to Vernona Rieger patient's nurse, who verbally acknowledged these results.   Original Report Authenticated By: Lacy Duverney, M.D.    CT of the brain   IMPRESSION:  1. No acute abnormality.  2. Minimal atrophy and minimal chronic small vessel white matter  ischemic changes in both cerebral hemispheres.  3. Mild chronic pansinusitis.  4. Tiny posterior globe calcification on the right at the optic  nerve  MRI of the brain   IMPRESSION:  Moderate size acute partially hemorrhagic left parietal lobe  infarct. There may be an acute and subacute components at this  level with subacute component hemorrhagic.  Diminutive size right vertebral artery please see below.  Prominent paranasal sinus mucosal thickening with polypoid  appearance maxillary sinuses greater on the left with polypoid  lesion measures up to 3 cm.  MRA of the brain   IMPRESSION:  Intracranial  atherosclerotic type changes as noted above.  2D Echocardiogram    Carotid Doppler    CXR    EKG   ATRIAL FIBRILLATION, V-RATE 79-149 ~ var'd rate, irreg atrial activity BASELINE WANDER IN LEAD(S) II,III,aVF  Therapy Recommendations pending  Physical Exam  General: The patient is alert and cooperative at the time of the examination.  Skin: No significant peripheral edema is noted.   Neurologic Exam  Cranial nerves: Facial symmetry is present. Speech is normal, no aphasia or dysarthria is noted. Extraocular movements are full. Visual fields are full.  Motor: The patient has good strength in all 4 extremities.  Coordination: The patient has good finger-nose-finger and heel-to-shin bilaterally.  Gait and station: The patient has a normal gait. Tandem gait is normal. Romberg is negative. No drift is seen.  Reflexes: Deep tendon reflexes are symmetric.    ASSESSMENT Marc Hansen is a 52 y.o. male presenting with a right hemiparesis, and speech issue. The patient had a clinical TIA. MRI shows evidence of a left parietal stroke. Since admission, the patient has had documented atrial fibrillation. The patient reports a high level alcohol intake, drinking 6 beers daily, more on the weekends. This may be an etiology of his atrial fibrillation.   Left parietal cardioembolic stroke  atrial fibrillation  Alcohol abuse  Tobacco abuse  Hypertension  Dyslipidemia  Hospital day # 1  TREATMENT/PLAN  Continue aspirin for now. The patient will need to be converted to anticoagulation such as Xarelto or Pradaxa within the next 3-5 days -Carotid Doppler study -2-D echocardiogram -The patient likely has little need for physical or occupational therapy, these therapies are pending. Will follow. I discussed with the patient concerning cessation of alcohol intake. If the patient is able to come off of alcohol, it is possible that he may remain out of atrial  fibrillation . Rhys Anchondo KEITH  06/06/2013 10:08 AM

## 2013-06-06 NOTE — ED Notes (Signed)
Pt stated that he was tired of waiting and the he was going to go home.  Pt's wife left the room and stated that she was so mad she was going to leave.

## 2013-06-06 NOTE — H&P (Addendum)
[facility] History and Physical  Deondre Marinaro ZOX:[id number] DOB: Aug 10, 1961 DOA: 06/05/2013  Referring physician:  EDP PCP: Danise Edge, MD  Specialists:  Neurololgy:  Dr. Amada Jupiter  Chief Complaint: Right Sided Weakness  HPI: Marc Hansen is a 52 y.o. male who presents with complaints of sudden onset of right sided weakness and difficulty speaking at 7 pm this evening.   He  Was seated and his right side went limp all of a sudden, and his speech was not making sense.  His symptoms lasted 15 minutes and began to gradually improve.  He denies having any chest pain or headache or syncope associated with the event.   And he denies having any previous similar events.   He was taken to the ED as seen as a Code Stroke by the EDP and Neurology, and his head CT scan was found to be negative, and he was not deemed to be a TPA candidate.   He was referred for admission for further workup.      Review of Systems: The patient denies anorexia, fever, chills, headaches, weight loss,, vision loss, diplopia, dizziness, decreased hearing, rhinitis, hoarseness, chest pain, syncope, dyspnea on exertion, peripheral edema, balance deficits, cough, hemoptysis, abdominal pain, nausea, vomiting, diarrhea, constipation, hematemesis, melena, hematochezia, severe indigestion/heartburn, dysuria, hematuria, incontinence, muscle weakness, suspicious skin lesions, transient blindness, difficulty walking, depression, unusual weight change, abnormal bleeding, enlarged lymph nodes, angioedema, and breast masses.    Past Medical History  Diagnosis Date  . Allergy     seasonal  . GERD (gastroesophageal reflux disease)   . Hypertension   . Overweight(278.02) 04/17/2011  . Arthritis 04/17/2011  . Reflux 04/17/2011  . HTN (hypertension) 04/17/2011  . Tobacco abuse 04/17/2011  . Acute bronchitis 04/17/2011  . Vitamin D deficiency 04/17/2011  . Hand crush injury 07/05/2011  . Umbilical hernia 07/05/2011  . Hyperlipidemia  07/16/2011  . Visual changes 07/16/2011  . a 03/12/2012  . Abnormal liver function 08/20/2012  . DOE (dyspnea on exertion) 11/09/2012  . 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 Medial epicondylitis of right elbow 11/09/2012    Past Surgical History  Procedure Laterality Date  . Vasectomy    . Knee surgery    . Carpal tunnel release      right, needed some nerve repair as well  . Eye surgery      left for detachmenet  . Right knee arthroscopy      Prior to Admission medications   Medication Sig Start Date End Date Taking? Authorizing Provider  albuterol (PROVENTIL HFA;VENTOLIN HFA) 108 (90 BASE) MCG/ACT inhaler Inhale 2 puffs into the lungs as needed. 08/17/12  Yes Bradd Canary, MD  albuterol (PROVENTIL) (2.5 MG/3ML) 0.083% nebulizer solution Take 3 mLs (2.5 mg total) by nebulization every 6 (six) hours as needed. 08/17/12  Yes Bradd Canary, MD  b complex vitamins tablet Take 1 tablet by mouth daily.   Yes Historical Provider, MD  cholecalciferol (VITAMIN D) 1000 UNITS tablet Take 1,000 Units by mouth daily.   Yes Historical Provider, MD  fish oil-omega-3 fatty acids 1000 MG capsule Take 2 g by mouth 2 (two) times daily.     Yes Historical Provider, MD  GARLIC PO Take 1 capsule by mouth daily.   Yes Historical Provider, MD  HYDROcodone-acetaminophen (NORCO/VICODIN) 5-325 MG per tablet Take 1 tablet by mouth every 8 (eight) hours as needed for pain. 03/17/13  Yes Bradd Canary, MD  losartan (COZAAR) 50 MG tablet Take 1 tablet (50  mg total) by mouth daily. NEEDS APPT FOR MORE REFILLS. 04/29/13  Yes Bradd Canary, MD  MILK THISTLE PO Take 1 tablet by mouth daily.   Yes Historical Provider, MD  multivitamin [facility]) per tablet Take 1 tablet by mouth daily.     Yes Historical Provider, MD  nitroGLYCERIN (NITROSTAT) 0.4 MG SL tablet Place 1 tablet (0.4 mg total) under the tongue every 5 (five) minutes as needed for chest pain. 08/17/12 08/17/13 Yes Bradd Canary, MD  omeprazole (PRILOSEC)  40 MG capsule Take 40 mg by mouth every 3 (three) days.   Yes Historical Provider, MD    No Known Allergies  Social History:  reports that he has been smoking Cigarettes.  He has been smoking about 0.30 packs per day. He has never used smokeless tobacco. He reports that  drinks alcohol. He reports that he does not use illicit drugs.     Family History  Problem Relation Age of Onset  . Cancer Mother 88    breast- remission  . Fibromyalgia Mother   . Other Father     CHF- stent   . Heart attack Father   . Heart disease Maternal Uncle   . Heart disease Maternal Grandmother   . Heart disease Maternal Grandfather      Physical Exam:  GEN:  Pleasant  52 y.o. Obese Caucasian male  examined  and in no acute distress; cooperative with exam Filed Vitals:   06/05/13 2245 06/05/13 2300 06/05/13 2315 06/05/13 2330  BP: 136/96 130/89 123/84 135/90  Pulse: 107 101 98 109  Temp:      TempSrc:      Resp: 16 15 19 20   Height:      Weight:      SpO2: 98% 95% 94% 97%   Blood pressure 135/90, pulse 109, temperature 98.2 F (36.8 C), temperature source Oral, resp. rate 20, height 6\' 4"  (1.93 m), weight 117.935 kg (260 lb), SpO2 97.00%. PSYCH: He is alert and oriented x4; does not appear anxious does not appear depressed; affect is normal HEENT: Normocephalic and Atraumatic, Mucous membranes pink; PERRLA; EOM intact; Fundi:  Benign;  No scleral icterus, Nares: Patent, Oropharynx: Clear, Dentures on Upper palate and partial on Bottom, Neck:  FROM, no cervical lymphadenopathy nor thyromegaly or carotid bruit; no JVD; Breasts:: Not examined CHEST WALL: No tenderness CHEST: Normal respiration, clear to auscultation bilaterally HEART: Regular rate and rhythm; no murmurs rubs or gallops BACK: No kyphosis or scoliosis; no CVA tenderness ABDOMEN: Positive Bowel Sounds, Obese, soft non-tender; no masses, no organomegaly.   Rectal Exam: Not done EXTREMITIES: No cyanosis, clubbing or edema; no  ulcerations. Genitalia: not examined PULSES: 2+ and symmetric SKIN: Normal hydration no rash or ulceration CNS: Cranial nerves 2-12 grossly intact no focal neurologic deficit, No pronator drift, Moving all 4 exts, FROM,  %/% bilateral grip strength.      Labs on Admission:  Basic Metabolic Panel:  Recent Labs Lab 06/05/13 2201 06/05/13 2216  NA 138 139  K 4.2 4.3  CL 103 107  CO2 22  --   GLUCOSE 89 92  BUN 25* 25*  CREATININE 1.12 1.30  CALCIUM 9.5  --    Liver Function Tests:  Recent Labs Lab 06/05/13 2201  AST 76*  ALT 108*  ALKPHOS 73  BILITOT 0.5  PROT 7.4  ALBUMIN 4.5   No results found for this basename: LIPASE, AMYLASE,  in the last 168 hours No results found for this basename: AMMONIA,  in the last 168 hours CBC:  Recent Labs Lab 06/05/13 2201 06/05/13 2216  WBC 10.7*  --   NEUTROABS 5.4  --   HGB 14.7 15.3  HCT 41.0 45.0  MCV 87.2  --   PLT 220  --    Cardiac Enzymes:  Recent Labs Lab 06/05/13 2201  TROPONINI <0.30    BNP (last 3 results) No results found for this basename: PROBNP,  in the last 8760 hours CBG: No results found for this basename: GLUCAP,  in the last 168 hours  Radiological Exams on Admission: Ct Head Wo Contrast  06/05/2013   *RADIOLOGY REPORT*  Clinical Data: Transient right arm weakness and facial weakness. New onset reading and spelling difficulty.  CT HEAD WITHOUT CONTRAST  Technique:  Contiguous axial images were obtained from the base of the skull through the vertex without contrast.  Comparison: None.  Findings: Minimally enlarged ventricles and subarachnoid spaces. Minimal patchy white matter low density in both cerebral hemispheres.  No intracranial hemorrhage, mass lesion or CT evidence of acute infarction.  Unremarkable bones.  Mucosal thickening involving all of the sinuses as well as a large left maxillary sinus retention cyst.  Small calcification in the posterior right globe at the optic nerve.  IMPRESSION:  1.   No acute abnormality. 2.  Minimal atrophy and minimal chronic small vessel white matter ischemic changes in both cerebral hemispheres. 3.  Mild chronic pansinusitis. 4.  Tiny posterior globe calcification on the right at the optic nerve  These results were called by telephone on 06/05/2013 at 2200 hours to Dr. Fonnie Jarvis, who verbally acknowledged these results.   Original Report Authenticated By: Beckie Salts, M.D.     EKG: Independently reviewed. Atrial fibrillation Rate =108   Assessment/Plan Principal Problem:   CVA (cerebral infarction) Active Problems:   Atrial fibrillation   HTN (hypertension)   Hyperlipidemia   Tobacco abuse disorder   Marijuana Usage   ETOH   1.   CVA versus TIA-  CVA workup initiated, MRI/MRA of Brain in AM, Neuro checks, ASA Rx, Neurology saw in ED, Not a TPA candidate.      2.   Atrial Fibrillation- New Diagnosis, ED ECHO ordered, may need a TEE,   IV Heparin Drip ordered.     3.  HTN-  Continue Losartan,  Monitor and IV hydralazine PRN SBP > 160.     4.  Hyperlipidemia-  On Omega 3 Fatty Acids for hyperlipidemia, Check Fasting lipid Panel.    5.  Tobacco Use disorder- Has decreased to 3-4 cigarettes a week, Does not need a daily Nicotine patch.  Counseled Re: continued Smoking Cessation.     6.  Polysubstance Abuse- UDS was+ for Opiates(prescribed)and for +Marijuana Usage, also +ETOH ( 6Pk daily of beer)-  CIWA Protocol ordered.       Code Status:    FULL CODE Family Communication:    No Family present Disposition Plan:         Time spent: 87 Minutes  Ron Parker [facility] Pager [phone]  If 7PM-7AM, please contact night-coverage [email or web address] Password [email or web address] 06/06/2013, 1:08 AM

## 2013-06-06 NOTE — Progress Notes (Signed)
Pt converted afib, rate 90's-120's, pt asymptomatic, MD aware, orders received, will continue to monitor.

## 2013-06-07 DIAGNOSIS — I6350 Cerebral infarction due to unspecified occlusion or stenosis of unspecified cerebral artery: Secondary | ICD-10-CM

## 2013-06-07 MED ORDER — ATORVASTATIN CALCIUM 10 MG PO TABS
10.0000 mg | ORAL_TABLET | Freq: Every day | ORAL | Status: DC
  Administered 2013-06-07: 10 mg via ORAL
  Filled 2013-06-07 (×2): qty 1

## 2013-06-07 NOTE — Progress Notes (Signed)
Stroke Team Progress Note  HISTORY Marc Hansen is a 52 y.o. male with a history of hypertension who presents after a transient episode of right-sided weakness and aphasia. On the ED physician's exam, it was noted that he had not completely resolved and therefore code stroke was activated. He states that when he was eating dinner, he had right arm weakness and difficulty speaking the lasted for about 15 minutes. This is mostly resolved, but he still has difficulty with texting and reading.  Patient was not a TPA candidate secondary to minimal deficit.  SUBJECTIVE No family at bedside. Patient reports he is doing well.  OBJECTIVE Most recent Vital Signs: Filed Vitals:   06/06/13 1625 06/06/13 2000 06/07/13 0000 06/07/13 0400  BP: 115/79 100/73 101/81 113/73  Pulse: 84 93 70 71  Temp: 97.9 F (36.6 C) 98 F (36.7 C) 98.1 F (36.7 C) 97.6 F (36.4 C)  TempSrc: Oral Oral Oral Oral  Resp: 20 18 18 18   Height:      Weight:    106.2 kg (234 lb 2.1 oz)  SpO2: 99% 98% 99% 99%   CBG (last 3)  No results found for this basename: GLUCAP,  in the last 72 hours  IV Fluid Intake:     MEDICATIONS  . aspirin  300 mg Rectal Daily   Or  . aspirin  325 mg Oral Daily  . cholecalciferol  1,000 Units Oral Daily  . diltiazem  30 mg Oral Q6H  . enoxaparin (LOVENOX) injection  40 mg Subcutaneous Q24H  . folic acid  1 mg Oral Daily  . LORazepam  0-4 mg Intravenous Q6H   Followed by  . [START ON 06/08/2013] LORazepam  0-4 mg Intravenous Q12H  . losartan  50 mg Oral Daily  . multivitamin with minerals  1 tablet Oral Daily  . omega-3 acid ethyl esters  2 g Oral BID  . pantoprazole  40 mg Oral Daily  . thiamine  100 mg Oral Daily   Or  . thiamine  100 mg Intravenous Daily   PRN:  albuterol, albuterol, LORazepam, LORazepam, nitroGLYCERIN, senna-docusate, sodium chloride  Diet:  Cardiac thin liquids Activity:  Up with assistance DVT Prophylaxis:  Lovenox  CLINICALLY SIGNIFICANT STUDIES Basic  Metabolic Panel:   Recent Labs Lab 06/05/13 2201 06/05/13 2216  NA 138 139  K 4.2 4.3  CL 103 107  CO2 22  --   GLUCOSE 89 92  BUN 25* 25*  CREATININE 1.12 1.30  CALCIUM 9.5  --    Liver Function Tests:   Recent Labs Lab 06/05/13 2201  AST 76*  ALT 108*  ALKPHOS 73  BILITOT 0.5  PROT 7.4  ALBUMIN 4.5   CBC:   Recent Labs Lab 06/05/13 2201 06/05/13 2216  WBC 10.7*  --   NEUTROABS 5.4  --   HGB 14.7 15.3  HCT 41.0 45.0  MCV 87.2  --   PLT 220  --    Coagulation:   Recent Labs Lab 06/05/13 2201  LABPROT 13.5  INR 1.04   Cardiac Enzymes:   Recent Labs Lab 06/05/13 2201  TROPONINI <0.30   Urinalysis:   Recent Labs Lab 06/05/13 2227  COLORURINE YELLOW  LABSPEC 1.025  PHURINE 5.0  GLUCOSEU NEGATIVE  HGBUR NEGATIVE  BILIRUBINUR NEGATIVE  KETONESUR NEGATIVE  PROTEINUR NEGATIVE  UROBILINOGEN 0.2  NITRITE NEGATIVE  LEUKOCYTESUR NEGATIVE   Lipid Panel    Component Value Date/Time   CHOL 187 06/06/2013 0425   TRIG 119 06/06/2013 0425  HDL 31* 06/06/2013 0425   CHOLHDL 6.0 06/06/2013 0425   VLDL 24 06/06/2013 0425   LDLCALC 132* 06/06/2013 0425   HgbA1C  Lab Results  Component Value Date   HGBA1C 5.8* 06/06/2013    Urine Drug Screen:     Component Value Date/Time   LABOPIA POSITIVE* 06/05/2013 2227   COCAINSCRNUR NONE DETECTED 06/05/2013 2227   LABBENZ NONE DETECTED 06/05/2013 2227   AMPHETMU NONE DETECTED 06/05/2013 2227   THCU POSITIVE* 06/05/2013 2227   LABBARB NONE DETECTED 06/05/2013 2227    Alcohol Level:   Recent Labs Lab 06/05/13 2201  ETH 20*    CT of the brain  06/05/2013  1. No acute abnormality. 2. Minimal atrophy and minimal chronic small vessel white matter ischemic changes in both cerebral hemispheres.  3. mild chronic pansinusitis. 4. Tiny posterior globe calcification on the right at the optic nerve  MRI of the brain  06/06/2013 Moderate size acute partially hemorrhagic left parietal lobe infarct. There may be an acute  and subacute components at this level with subacute component hemorrhagic. Diminutive size right vertebral artery please see below. Prominent paranasal sinus mucosal thickening with polypoid appearance maxillary sinuses greater on the left with polypoid lesion measures up to 3 cm.  MRA of the brain  06/06/2013 Intracranial atherosclerotic type changes.  2D Echocardiogram    Carotid Doppler    CXR  06/06/2013    No acute cardiopulmonary disease.    EKG  ATRIAL FIBRILLATION, V-RATE 79-149 ~ var'd rate, irreg atrial activity BASELINE WANDER IN LEAD(S) II,III,aVF  Therapy Recommendations   Physical Exam  General: The patient is alert and cooperative at the time of the examination.  Skin: No significant peripheral edema is noted.   Neurologic Exam  Cranial nerves: Facial symmetry is present. Speech is normal, no aphasia or dysarthria is noted. Extraocular movements are full. Visual fields are full.  Motor: The patient has good strength in all 4 extremities.  Coordination: The patient has good finger-nose-finger and heel-to-shin bilaterally.  Gait and station: The patient has a normal gait. Tandem gait is normal. Romberg is negative. No drift is seen.  Reflexes: Deep tendon reflexes are symmetric.  ASSESSMENT Mr. Marc Hansen is a 52 y.o. male presenting with a right hemiparesis, and speech issue. MRI confirms a left parietal stroke with hemorrhagic tranformation. Stroke found to be embolic due to new onset atrial fibrillation. The patient reports a high level alcohol intake, drinking 6 beers daily, more on the weekends. This may be an etiology of his atrial fibrillation. Minimal deficits remaining   Left parietal cardioembolic stroke  atrial fibrillation, new onset   Alcohol abuse  Tobacco abuse  Hypertension Hyperlipidemia, LDL 132, on lovaza alone PTA, now on lovaza, goal LDL < 100 (< 70 for diabetics) HgbA1c 5.8  Hospital day # 2  TREATMENT/PLAN  Continue aspirin for  now. Given hemorrhagic transformation, will delay  anticoagulation such as Xarelto or Pradaxa for 3-5 days. Stop aspirin once NOAC started. Will ask care management to assess insurance coverage/need for prior approval.  F/u Carotid Doppler study, 2-D echocardiogram   physical or occupational therapy evals pending  alcohol cessation counseling  Add statin  Return to work in ~3 weeks if remains stable  Add statin for elevated LDL . BIBY,SHARON  06/07/2013 8:14 AM   Lesly Dukes

## 2013-06-07 NOTE — Progress Notes (Signed)
Clinical Child psychotherapist (CSW) received a referral for substance abuse counseling. CSW stated he was not interested in any resources and was not interested in speaking with CSW to discuss substance abuse.  Assessment not completed as pt not interested in participating. CSW signing off please reconsult if any additional CSW needs arise.  Theresia Bough, MSW, Theresia Majors [phone]

## 2013-06-07 NOTE — Progress Notes (Signed)
Echo Lab  2D Echocardiogram completed.  Giovan Pinsky L Farra Nikolic, RDCS 06/07/2013 11:22 AM

## 2013-06-07 NOTE — Evaluation (Signed)
Speech Language Pathology Evaluation Patient Details Name: Marc Hansen MRN: [id number] DOB: [birth year] Today's Date: 06/07/2013 Time: 7829-5621 SLP Time Calculation (min): 25 min  Problem List:  Patient Active Problem List   Diagnosis Date Noted  . CVA (cerebral infarction) 06/06/2013  . Atrial fibrillation 06/06/2013  . Tobacco abuse disorder 06/06/2013  . DOE (dyspnea on exertion) 11/09/2012  . Medial epicondylitis of right elbow 11/09/2012  . Abnormal liver function 08/20/2012  . Chest pain, atypical 03/12/2012  . Preventative health care 07/24/2011  . Hyperlipidemia 07/16/2011  . Visual changes 07/16/2011  . Hand crush injury 07/05/2011  . Umbilical hernia 07/05/2011  . Overweight 04/17/2011  . Arthritis 04/17/2011  . Reflux 04/17/2011  . HTN (hypertension) 04/17/2011  . 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 Acute bronchitis 04/17/2011  . Vitamin D deficiency 04/17/2011   Past Medical History:  Past Medical History  Diagnosis Date  . Allergy     seasonal  . Hypertension   . Overweight(278.02) 04/17/2011  . Arthritis 04/17/2011  . Reflux 04/17/2011  . HTN (hypertension) 04/17/2011  . Tobacco abuse 04/17/2011  . Acute bronchitis 04/17/2011  . Vitamin D deficiency 04/17/2011  . Hand crush injury 07/05/2011  . Umbilical hernia 07/05/2011  . Hyperlipidemia 07/16/2011  . Visual changes 07/16/2011  . a 03/12/2012  . Abnormal liver function 08/20/2012  . 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 Medial epicondylitis of right elbow 11/09/2012  . DOE (dyspnea on exertion) 11/09/2012   Past Surgical History:  Past Surgical History  Procedure Laterality Date  . Vasectomy    . Knee surgery    . Carpal tunnel release      right, needed some nerve repair as well  . Right knee arthroscopy     HPI:  52 y.o. male who presents with complaints of sudden onset of right sided weakness and difficulty speaking at 7 pm this evening. He Was seated and his right side went limp all of a  sudden, and his speech was not making sense.  His head CT scan was found to be negative, and he was not deemed to be a TPA candidate. MRI revealed moderate size acute partially hemorrhagic left parietal lobe infarct.  He was referred for admission for further workup.  PMH:  GERD, HTN, hyperlipidemia, overweight.   Assessment / Plan / Recommendation Clinical Impression  Pt.'s verbal comprehension is WFL's.  His expressive language in conversation is fluent without language disturbances.  Speech is 100% intelligible.  He exhibited mild difficutly reading sentences in newspaper which he stated he could see the letters/words but "sometimes they don't make sense."  He demonstrated diffulty texting and entered the same words twice.  He reported he can see the letters on the keypad and manual dexterity appeared functional.  Pt. able to write 2-3 sentence paragraph without errors.  Possible mild agraphia with texting (?).  Pt. could benefit from one more session to further assess (diagnostic assessment) of reading and writing abilities and make appropriate recommendations.       SLP Assessment  Patient needs continued Speech Lanaguage Pathology Services    Follow Up Recommendations   (to be determined)    Frequency and Duration min 2x/week  2 weeks   Pertinent Vitals/Pain none   SLP Goals  SLP Goals Potential to Achieve Goals: Good SLP Goal #1: Pt. will read paragraphs and answer questions with 100 accuracy with modified independencwe cues. SLP Goal #2: Pt. will text on phone with 90% accuracy with modified independence for  compensatory strategies.  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 )   Cognition  Overall Cognitive Status: Within Functional Limits for tasks assessed Arousal/Alertness: Awake/alert Orientation Level: Oriented X4 Attention:  Sustained Sustained Attention: Appears intact Memory: Appears intact Awareness: Appears intact Problem Solving: Appears intact Behaviors:  (slight impulsivity) Safety/Judgment: Appears intact    Comprehension  Auditory Comprehension Overall Auditory Comprehension: Appears within functional limits for tasks assessed Yes/No Questions: Within Functional Limits Commands: Within Functional Limits Conversation: Simple Visual Recognition/Discrimination Discrimination: Not tested Reading Comprehension Reading Status: Impaired Sentence Level: Impaired Paragraph Level: Impaired Functional Environmental (signs, name badge): Within functional limits    Expression Expression Primary Mode of Expression: Verbal Verbal Expression Overall Verbal Expression: Appears within functional limits for tasks assessed Initiation: No impairment Level of Generative/Spontaneous Verbalization: Conversation Repetition: No impairment Naming: No impairment Pragmatics: No impairment Written Expression Dominant Hand: Right Written Expression: Exceptions to [facility] Self Formulation Ability: Phrase;Sentence (ok for written, difficulty texting)   Oral / Motor Oral Motor/Sensory Function Overall Oral Motor/Sensory Function: Impaired Labial ROM: Reduced right Labial Symmetry: Within Functional Limits Labial Strength: Within Functional Limits Lingual ROM: Within Functional Limits Facial ROM: Within Functional Limits Facial Symmetry: Within Functional Limits Motor Speech Overall Motor Speech: Appears within functional limits for tasks assessed Respiration: Within functional limits Phonation: Normal Resonance: Within functional limits Articulation: Within functional limitis Intelligibility: Intelligible Motor Planning: Witnin functional limits   GO     Breck Coons [employer] [phone]  06/07/2013

## 2013-06-07 NOTE — Care Management Note (Unsigned)
    Page 1 of 1   06/07/2013     3:34:42 PM   CARE MANAGEMENT NOTE 06/07/2013  Patient:  Marc Hansen   Account Number:  [id number]  Date Initiated:  06/07/2013  Documentation initiated by:  GRAVES-BIGELOW,Esabella Stockinger  Subjective/Objective Assessment:   Pt admitted with afib.MRI: Findings:  Moderate size acute partially hemorrhagic left parietal  lobe infarct. Per MD notes plan for  converted to anticoagulation such as Xarelto or Pradaxa within the next 3-5 days.     Action/Plan:   Pt states he has [employer] and uses [employer]. CM wil call to see what the co pay may be.   Anticipated DC Date:  06/09/2013   Anticipated DC Plan:  HOME/SELF CARE      DC Planning Services  CM consult  Medication Assistance      Choice offered to / List presented to:             Status of service:  In process, will continue to follow Medicare Important Message given?   (If response is "NO", the following Medicare IM given date fields will be blank) Date Medicare IM given:   Date Additional Medicare IM given:    Discharge Disposition:    Per UR Regulation:  Reviewed for med. necessity/level of care/duration of stay  If discussed at Long Length of Stay Meetings, dates discussed:    Comments:  06-07-13 1532 Tomi Bamberger, RN,BSN [phone] CM did call pharmacy and the cost for pradaxa will be 151.54 and the xarelto will cost 296.30. Once decision has been made for which med- the CM will provide pt with a discount card. Will f/u.

## 2013-06-07 NOTE — Evaluation (Signed)
Physical Therapy Evaluation Patient Details Name: Marc Hansen MRN: [id number] DOB: 09/04/1961 Today's Date: 06/07/2013 Time: 4098-1191    PT Assessment / Plan / Recommendation Clinical Impression  52 y/o WM admitted after having episode at home of R UE/facial weakness.  Pt I with bed mobility, transfers, and gait with challenged balance.  No PT needs identified and will be d/c from PT.  Pt reports only difficulty he is having is with texting.  Pt educated on s/s of CVA.    PT Assessment  Patent does not need any further PT services    Follow Up Recommendations  No PT follow up    Does the patient have the potential to tolerate intense rehabilitation      Barriers to Discharge        Equipment Recommendations  None recommended by PT    Recommendations for Other Services     Frequency      Precautions / Restrictions Precautions Precautions: None   Pertinent Vitals/Pain headache      Mobility  Bed Mobility Bed Mobility: Supine to Sit Supine to Sit: 7: Independent Transfers Transfers: Sit to Stand;Stand to Sit Sit to Stand: 7: Independent Stand to Sit: 7: Independent Ambulation/Gait Ambulation/Gait Assistance: 7: Independent Ambulation Distance (Feet): 250 Feet Assistive device: None Gait Pattern: Within Functional Limits Modified Rankin (Stroke Patients Only) Pre-Morbid Rankin Score: No symptoms Modified Rankin: No symptoms    Exercises     PT Diagnosis:    PT Problem List:   PT Treatment Interventions:     PT Goals    Visit Information  Last PT Received On: 06/07/13 Assistance Needed: +1    Subjective Data  Subjective: It really scared me. Patient Stated Goal: go home.   Prior Functioning  Home Living Lives With: Spouse Available Help at Discharge: Family Type of Home: House Home Access: Stairs to enter Secretary/administrator of Steps: 3 Entrance Stairs-Rails: Can reach both;Right;Left Home Layout: One level Bathroom Shower/Tub: Conservator, museum/gallery: None Prior Function Level of Independence: Independent Able to Take Stairs?: Yes Driving: Yes Vocation: Full time employment Communication Communication: No difficulties Dominant Hand: Right    Cognition  Cognition Arousal/Alertness: Awake/alert Behavior During Therapy: WFL for tasks assessed/performed Overall Cognitive Status: Within Functional Limits for tasks assessed    Extremity/Trunk Assessment Right Lower Extremity Assessment RLE ROM/Strength/Tone: Within functional levels Left Lower Extremity Assessment LLE ROM/Strength/Tone: Within functional levels   Balance High Level Balance High Level Balance Activites: Sudden stops;Turns;Head turns;Direction changes High Level Balance Comments: pt with no LOB  End of Session PT - End of Session Activity Tolerance: Patient tolerated treatment well Patient left: Other (comment) (w/c to be taken to vascular) Nurse Communication: Mobility status  GP     [facility]UB[facility] 06/07/2013, 10:38 AM

## 2013-06-07 NOTE — Progress Notes (Signed)
VASCULAR LAB PRELIMINARY  PRELIMINARY  PRELIMINARY  PRELIMINARY  Carotid duplex completed.    Preliminary report:  Bilateral:  Less than 39% ICA stenosis.  Vertebral artery flow is antegrade.      Arraya Buck, RVS 06/07/2013, 11:16 AM

## 2013-06-07 NOTE — Progress Notes (Addendum)
[facility] PROGRESS NOTE  Marc Hansen AVW:[id number] DOB: 1961/09/23 DOA: 06/05/2013 PCP: Danise Edge, MD  Assessment/Plan: 1. 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  2. Atrial Fibrillation- New Diagnosis, await echo, rate controlled on Cardizem, currently on aspirin to be started on anticoagulation as above 3. HTN- Continue Losartan, Monitor and IV hydralazine PRN SBP > 160.  4. Hyperlipidemia- On Omega 3 Fatty Acids for hyperlipidemia, Check Fasting lipid Panel.  5. Tobacco Use disorder -Counseled to quit.  6. Polysubstance Abuse- UDS was+ for Opiates(prescribed)and for +Marijuana Usage, also +ETOH ( 6Pk daily of beer)- on CIWA Protocol -Social consulted for resources to quit     Code Status: Full Family Communication: pt at bedside Disposition Plan: To home when medically ready   Consultants:  Neurology  Procedures:  Echo pending  Antibiotics:  None  HPI/Subjective: Denies any new complaints, his speech and R.sided strength remain normal today .  Objective: Filed Vitals:   06/06/13 2000 06/07/13 0000 06/07/13 0400 06/07/13 0800  BP: 100/73 101/81 113/73 138/98  Pulse: 93 70 71 87  Temp: 98 F (36.7 C) 98.1 F (36.7 C) 97.6 F (36.4 C) 98.2 F (36.8 C)  TempSrc: Oral Oral Oral Oral  Resp: 18 18 18 18   Height:      Weight:   106.2 kg (234 lb 2.1 oz)   SpO2: 98% 99% 99% 99%    Intake/Output Summary (Last 24 hours) at 06/07/13 1044 Last data filed at 06/07/13 0900  Gross per 24 hour  Intake    600 ml  Output      4 ml  Net    596 ml   Filed Weights   06/05/13 2121 06/06/13 0151 06/07/13 0400  Weight: 117.935 kg (260 lb) 106.323 kg (234 lb 6.4 oz) 106.2 kg (234 lb 2.1 oz)    Exam:   General:  alert and oriented x3 no apparent distress  Cardiovascular: Regular  rate and rhythm, normal S1 S2  Respiratory: Clear to auscultation bilaterally no crackles  Abdomen: Soft lumbar sounds present nontender nondistended no organomegaly no masses  Extremities: No cyanosis and no edema  Data Reviewed: Basic Metabolic Panel:  Recent Labs Lab 06/05/13 2201 06/05/13 2216  NA 138 139  K 4.2 4.3  CL 103 107  CO2 22  --   GLUCOSE 89 92  BUN 25* 25*  CREATININE 1.12 1.30  CALCIUM 9.5  --    Liver Function Tests:  Recent Labs Lab 06/05/13 2201  AST 76*  ALT 108*  ALKPHOS 73  BILITOT 0.5  PROT 7.4  ALBUMIN 4.5   No results found for this basename: LIPASE, AMYLASE,  in the last 168 hours No results found for this basename: AMMONIA,  in the last 168 hours CBC:  Recent Labs Lab 06/05/13 2201 06/05/13 2216  WBC 10.7*  --   NEUTROABS 5.4  --   HGB 14.7 15.3  HCT 41.0 45.0  MCV 87.2  --   PLT 220  --    Cardiac Enzymes:  Recent Labs Lab 06/05/13 2201  TROPONINI <0.30   BNP (last 3 results) No results found for this basename: PROBNP,  in the last 8760 hours CBG: No results found for this basename: GLUCAP,  in the last 168 hours  No results found for this or any previous visit (from the  past 240 hour(s)).   Studies: Dg Chest 2 View  06/06/2013   *RADIOLOGY REPORT*  Clinical Data: Stroke.  CHEST - 2 VIEW  Comparison: 08/17/2012  Findings: Two views of the chest were obtained.  Lungs are clear without airspace disease or edema. Heart and mediastinum are within normal limits.  The trachea is midline and no evidence for pleural effusions.  IMPRESSION: No acute cardiopulmonary disease.   Original Report Authenticated By: Richarda Overlie, M.D.   Ct Head Wo Contrast  06/05/2013   *RADIOLOGY REPORT*  Clinical Data: Transient right arm weakness and facial weakness. New onset reading and spelling difficulty.  CT HEAD WITHOUT CONTRAST  Technique:  Contiguous axial images were obtained from the base of the skull through the vertex without contrast.   Comparison: None.  Findings: Minimally enlarged ventricles and subarachnoid spaces. Minimal patchy white matter low density in both cerebral hemispheres.  No intracranial hemorrhage, mass lesion or CT evidence of acute infarction.  Unremarkable bones.  Mucosal thickening involving all of the sinuses as well as a large left maxillary sinus retention cyst.  Small calcification in the posterior right globe at the optic nerve.  IMPRESSION:  1.  No acute abnormality. 2.  Minimal atrophy and minimal chronic small vessel white matter ischemic changes in both cerebral hemispheres. 3.  Mild chronic pansinusitis. 4.  Tiny posterior globe calcification on the right at the optic nerve  These results were called by telephone on 06/05/2013 at 2200 hours to Dr. Fonnie Jarvis, who verbally acknowledged these results.   Original Report Authenticated By: Beckie Salts, M.D.   Mr Brain Wo Contrast  06/06/2013   *RADIOLOGY REPORT*  Clinical Data:  Right-sided weakness.  Hypertensive hyperlipidemic overweight patients with history of tobacco use.  MRI BRAIN WITHOUT CONTRAST MRA HEAD WITHOUT CONTRAST  Technique: Multiplanar, multiecho pulse sequences of the brain and surrounding structures were obtained according to standard protocol without intravenous contrast.  Angiographic images of the head were obtained using MRA technique without contrast.  Comparison: 06/05/2013 CT.  No comparison MR.  MRI HEAD  Findings:  Moderate size acute partially hemorrhagic left parietal lobe infarct. There may be an acute and subacute components at this level with subacute component hemorrhagic.  No intracranial mass lesion detected on this unenhanced exam.  No hydrocephalus.  Diminutive size right vertebral artery please see below.  Prominent paranasal sinus mucosal thickening with polypoid appearance maxillary sinuses greater on the left with polypoid lesion measures up to 3 cm.  Cervical medullary junction, pituitary region, pineal region and orbital  structures unremarkable.  IMPRESSION:  Moderate size acute partially hemorrhagic left parietal lobe infarct. There may be an acute and subacute components at this level with subacute component hemorrhagic.  Diminutive size right vertebral artery please see below.  Prominent paranasal sinus mucosal thickening with polypoid appearance maxillary sinuses greater on the left with polypoid lesion measures up to 3 cm.  MRA HEAD  Findings: Right internal carotid artery is slightly smaller than the left which may be related to right hypoplastic A1 segment configuration rather than proximal right internal carotid artery stenosis although the latter is not entirely excluded.  Fetal type origin posterior cerebral arteries bilaterally.  Mild narrowing right internal carotid artery supraclinoid segment.  Mild irregularity M1 segment right middle cerebral artery without high-grade stenosis.  Middle cerebral artery branch vessel irregularity bilaterally.  No significant stenosis M1 segment left middle cerebral artery.  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  No significant stenosis of  the left vertebral artery or basilar artery.  Nonvisualization AICAs.  Narrowed irregular superior cerebellar artery bilaterally.  Mild narrowing P1 segment left posterior cerebral artery. Decreased caliber of the right posterior cerebral artery compared to the left. Posterior cerebral artery branch vessel irregularity and narrowing greater on the right.  No aneurysm or vascular malformation noted.  IMPRESSION: Intracranial atherosclerotic type changes as noted above.  Critical Value/emergent results were called by telephone at the time of interpretation on 06/06/2013 at 9:42 a.m. to Vernona Rieger patient's nurse, who verbally acknowledged these results.   Original Report Authenticated By: Lacy Duverney, M.D.   Mr Mra Head/brain Wo Cm  06/06/2013   *RADIOLOGY REPORT*  Clinical Data:  Right-sided weakness.   Hypertensive hyperlipidemic overweight patients with history of tobacco use.  MRI BRAIN WITHOUT CONTRAST MRA HEAD WITHOUT CONTRAST  Technique: Multiplanar, multiecho pulse sequences of the brain and surrounding structures were obtained according to standard protocol without intravenous contrast.  Angiographic images of the head were obtained using MRA technique without contrast.  Comparison: 06/05/2013 CT.  No comparison MR.  MRI HEAD  Findings:  Moderate size acute partially hemorrhagic left parietal lobe infarct. There may be an acute and subacute components at this level with subacute component hemorrhagic.  No intracranial mass lesion detected on this unenhanced exam.  No hydrocephalus.  Diminutive size right vertebral artery please see below.  Prominent paranasal sinus mucosal thickening with polypoid appearance maxillary sinuses greater on the left with polypoid lesion measures up to 3 cm.  Cervical medullary junction, pituitary region, pineal region and orbital structures unremarkable.  IMPRESSION:  Moderate size acute partially hemorrhagic left parietal lobe infarct. There may be an acute and subacute components at this level with subacute component hemorrhagic.  Diminutive size right vertebral artery please see below.  Prominent paranasal sinus mucosal thickening with polypoid appearance maxillary sinuses greater on the left with polypoid lesion measures up to 3 cm.  MRA HEAD  Findings: Right internal carotid artery is slightly smaller than the left which may be related to right hypoplastic A1 segment configuration rather than proximal right internal carotid artery stenosis although the latter is not entirely excluded.  Fetal type origin posterior cerebral arteries bilaterally.  Mild narrowing right internal carotid artery supraclinoid segment.  Mild irregularity M1 segment right middle cerebral artery without high-grade stenosis.  Middle cerebral artery branch vessel irregularity bilaterally.  No  significant stenosis M1 segment left middle cerebral artery.  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  No significant stenosis of the left vertebral artery or basilar artery.  Nonvisualization AICAs.  Narrowed irregular superior cerebellar artery bilaterally.  Mild narrowing P1 segment left posterior cerebral artery. Decreased caliber of the right posterior cerebral artery compared to the left. Posterior cerebral artery branch vessel irregularity and narrowing greater on the right.  No aneurysm or vascular malformation noted.  IMPRESSION: Intracranial atherosclerotic type changes as noted above.  Critical Value/emergent results were called by telephone at the time of interpretation on 06/06/2013 at 9:42 a.m. to Vernona Rieger patient's nurse, who verbally acknowledged these results.   Original Report Authenticated By: Lacy Duverney, M.D.    Scheduled Meds: . aspirin  300 mg Rectal Daily   Or  . aspirin  325 mg Oral Daily  . atorvastatin  10 mg Oral q1800  . cholecalciferol  1,000 Units Oral Daily  . diltiazem  30 mg Oral Q6H  . enoxaparin (LOVENOX) injection  40 mg Subcutaneous Q24H  . folic acid  1 mg  Oral Daily  . LORazepam  0-4 mg Intravenous Q6H   Followed by  . [START ON 06/08/2013] LORazepam  0-4 mg Intravenous Q12H  . losartan  50 mg Oral Daily  . multivitamin with minerals  1 tablet Oral Daily  . omega-3 acid ethyl esters  2 g Oral BID  . pantoprazole  40 mg Oral Daily  . thiamine  100 mg Oral Daily   Or  . thiamine  100 mg Intravenous Daily   Continuous Infusions:   Principal Problem:   CVA (cerebral infarction) Active Problems:   HTN (hypertension)   Hyperlipidemia   Atrial fibrillation   Tobacco abuse disorder    Time spent: 25    [facility]  [facility] Pager [phone]. If 7PM-7AM, please contact night-coverage at [email or web address], password [facility] 06/07/2013, 10:44 AM  LOS: 2 days

## 2013-06-08 DIAGNOSIS — K769 Liver disease, unspecified: Secondary | ICD-10-CM

## 2013-06-08 LAB — CBC
HCT: 42.5 % (ref 39.0–52.0)
MCH: 30.2 pg (ref 26.0–34.0)
MCHC: 34.6 g/dL (ref 30.0–36.0)
MCV: 87.3 fL (ref 78.0–100.0)
RDW: 12.6 % (ref 11.5–15.5)

## 2013-06-08 LAB — BASIC METABOLIC PANEL
BUN: 18 mg/dL (ref 6–23)
Calcium: 9.6 mg/dL (ref 8.4–10.5)
Creatinine, Ser: 1.13 mg/dL (ref 0.50–1.35)
GFR calc Af Amer: 86 mL/min — ABNORMAL LOW (ref >90)
GFR calc non Af Amer: 74 mL/min — ABNORMAL LOW (ref >90)
Potassium: 4.3 mEq/L (ref 3.5–5.1)

## 2013-06-08 MED ORDER — DABIGATRAN ETEXILATE MESYLATE 150 MG PO CAPS
150.0000 mg | ORAL_CAPSULE | Freq: Two times a day (BID) | ORAL | Status: DC
  Administered 2013-06-08: 150 mg via ORAL
  Filled 2013-06-08 (×2): qty 1

## 2013-06-08 MED ORDER — OMEGA-3 FATTY ACIDS 1000 MG PO CAPS: 2.0000 g | ORAL_CAPSULE | Freq: Two times a day (BID) | ORAL | Status: DC

## 2013-06-08 MED ORDER — DABIGATRAN ETEXILATE MESYLATE 150 MG PO CAPS: 150.0000 mg | ORAL_CAPSULE | Freq: Two times a day (BID) | ORAL | Status: DC

## 2013-06-08 MED ORDER — THIAMINE HCL 100 MG PO TABS: 100.0000 mg | ORAL_TABLET | Freq: Every day | ORAL | Status: DC

## 2013-06-08 MED ORDER — DILTIAZEM HCL ER COATED BEADS 120 MG PO CP24
120.0000 mg | ORAL_CAPSULE | Freq: Every day | ORAL | Status: DC
  Administered 2013-06-08: 120 mg via ORAL
  Filled 2013-06-08: qty 1

## 2013-06-08 MED ORDER — DILTIAZEM HCL ER COATED BEADS 120 MG PO CP24: 120.0000 mg | ORAL_CAPSULE | Freq: Every day | ORAL | Status: DC

## 2013-06-08 MED ORDER — ATORVASTATIN CALCIUM 10 MG PO TABS: 10.0000 mg | ORAL_TABLET | Freq: Every day | ORAL | Status: DC

## 2013-06-08 MED ORDER — FOLIC ACID 1 MG PO TABS: 1.0000 mg | ORAL_TABLET | Freq: Every day | ORAL | Status: DC

## 2013-06-08 NOTE — Progress Notes (Signed)
OT Cancellation Note  Patient Details Name: Yazen Rosko MRN: [id number] DOB: [birth year]   Cancelled Treatment:    Reason Eval/Treat Not Completed: Pt reports no OT needs & is being d/c home today.  Roselie Awkward Dixon 06/08/2013, 11:51 AM

## 2013-06-08 NOTE — Discharge Summary (Signed)
Physician Discharge Summary  Marc Hansen [name]:[id number] DOB: July 09, 1961 DOA: 06/05/2013  PCP: Danise Edge, MD  Admit date: 06/05/2013 Discharge date: 06/08/2013  Time spent: >53minutes  Recommendations for Outpatient Follow-up:      Follow-up Information   Follow up with SETHI,PRAMODKUMAR P, MD In 2 months. (stroke clinic)    Contact information:   [address] [place] [address] [phone]       Follow up with Danise Edge, MD. (in 1week, call for appt upon discharge)    Contact information:   [address] [place] [address] [phone]      Cardiology referral per PCP- discussed with patient the  Discharge Diagnoses:  Principal Problem:   CVA (cerebral infarction) Active Problems:   HTN (hypertension)   Hyperlipidemia   Atrial fibrillation   Tobacco abuse disorder   Discharge Condition: Improved/stable  Diet recommendation: 2 g sodium heart healthy  Filed Weights   06/05/13 2121 06/06/13 0151 06/07/13 0400  Weight: 117.935 kg (260 lb) 106.323 kg (234 lb 6.4 oz) 106.2 kg (234 lb 2.1 oz)    History of present illness:  Marc Hansen is a 52 y.o. male who presents with complaints of sudden onset of right sided weakness and difficulty speaking at 7 pm this evening. He Was seated and his right side went limp all of a sudden, and his speech was not making sense. His symptoms lasted 15 minutes and began to gradually improve. He denies having any chest pain or headache or syncope associated with the event. And he denies having any previous similar events. He was taken to the ED as seen as a Code Stroke by the EDP and Neurology, and his head CT scan was found to be negative, and he was not deemed to be a TPA candidate. He was referred for admission for further workup.   Hospital Course:  1. left parietal lobe infarct with hemorrhagic transformation  -per MRI/MRA of Brain  -Upon admission history and was placed on aspirin initially, neurology was  consulted and followed patient while in the hospital and recommended  to convert patient to Pradaxa in 3-5 days- this was done today prior to discharge and is to continue the Pradaxa upon discharge. PT OT was consulted follow patient in the hospital and no skilled needs were recommended. Speech therapy also saw patient in the hospital, and were to follow to determine continued speech therapy needs the patient , but pt declined today per case manager stating he will not need any further speech therapy followup.He  si to follow up with Dr Pearlean Brownie in 2mos. 2. Atrial Fibrillation- New Diagnosis, rate controlled on Cardizem. Patient was placed on aspirin initially secondary to #1, neurology recommended comforting to pradaxa(3-5 days following admission) and it was started today, and aspirin discontinued. Echocardiogram was done and ejection fraction 45-50% with the left atrium moderately dilated. History is controlled on the Cardizem and his to followup with his PCP on discharge and be referred to cardiology outpatient (per PCP). For his mild LV dysfunction he was maintianed on the ARB which he is to continue upon discharge. 3. HTN- Continue Losartan, and Cardizem for #2 on discharge 4. Hyperlipidemia- On Omega 3 Fatty Acids for hyperlipidemia, fasting lipid profile was done and showed a total cholesterol of 187 with HDL of 31 LDL 132. She was started on Lipitor which is to continue upon discharge. 5. Tobacco Use disorder  -Counseled to quit.  6. Polysubstance Abuse- UDS was+ for Opiates(prescribed)and for +Marijuana Usage,  also +ETOH ( 6Pk daily of beer)- on CIWA Protocol  -Patient was counseled to quit alcohol, Social work consulted for resources to quit     Procedures:  ECHO Study Conclusions  - Left ventricle: Systolic function was mildly reduced. The estimated ejection fraction was in the range of 45% to 50%. - Mitral valve: Moderate to severe regurgitation. - Left atrium: The atrium was moderately  dilated.    Carotid Dopplers-less than 39% ICA stenosis  Consultations:  Neuro  Discharge Exam: Filed Vitals:   06/08/13 0000 06/08/13 0400 06/08/13 0650 06/08/13 1030  BP: 147/87 113/79 127/97 128/80  Pulse: 96 80  80  Temp: 98.1 F (36.7 C) 97.9 F (36.6 C)    TempSrc: Oral Oral    Resp: 18 18    Height:      Weight:      SpO2: 98% 99%     Exam:  General: alert and oriented x3 no apparent distress  Cardiovascular: Irregular rate and rhythm, rate controlled normal S1 S2  Respiratory: Clear to auscultation bilaterally no crackles  Abdomen: Soft lumbar sounds present nontender nondistended no organomegaly no masses  Extremities: No cyanosis and no edema   Discharge Instructions  Discharge Orders   Future Orders Complete By Expires     Diet - low sodium heart healthy  As directed     Increase activity slowly  As directed         Medication List    STOP taking these medications       fish oil-omega-3 fatty acids 1000 MG capsule     GARLIC PO      TAKE these medications       albuterol 108 (90 BASE) MCG/ACT inhaler  Commonly known as:  PROVENTIL HFA;VENTOLIN HFA  Inhale 2 puffs into the lungs as needed.     albuterol (2.5 MG/3ML) 0.083% nebulizer solution  Commonly known as:  PROVENTIL  Take 3 mLs (2.5 mg total) by nebulization every 6 (six) hours as needed.     b complex vitamins tablet  Take 1 tablet by mouth daily.     cholecalciferol 1000 UNITS tablet  Commonly known as:  VITAMIN D  Take 1,000 Units by mouth daily.     dabigatran 150 MG Caps  Commonly known as:  PRADAXA  Take 1 capsule (150 mg total) by mouth every 12 (twelve) hours.     diltiazem 120 MG 24 hr capsule  Commonly known as:  CARDIZEM CD  Take 1 capsule (120 mg total) by mouth daily.     folic acid 1 MG tablet  Commonly known as:  FOLVITE  Take 1 tablet (1 mg total) by mouth daily.     HYDROcodone-acetaminophen 5-325 MG per tablet  Commonly known as:  NORCO/VICODIN  Take 1  tablet by mouth every 8 (eight) hours as needed for pain.     losartan 50 MG tablet  Commonly known as:  COZAAR  Take 1 tablet (50 mg total) by mouth daily. NEEDS APPT FOR MORE REFILLS.     MILK THISTLE PO  Take 1 tablet by mouth daily.     multivitamin per tablet  Take 1 tablet by mouth daily.     nitroGLYCERIN 0.4 MG SL tablet  Commonly known as:  NITROSTAT  Place 1 tablet (0.4 mg total) under the tongue every 5 (five) minutes as needed for chest pain.     omeprazole 40 MG capsule  Commonly known as:  PRILOSEC  Take 40 mg by mouth  every 3 (three) days.     thiamine 100 MG tablet  Take 1 tablet (100 mg total) by mouth daily.      Lipitor 10 mg by mouth daily Omega-3 fatty acids 2 g by mouth twice a day as previously No Known Allergies     Follow-up Information   Follow up with Gates Rigg, MD In 2 months. (stroke clinic)    Contact information:   [address] [place] [address] [phone]       Follow up with Danise Edge, MD. (in 1week, call for appt upon discharge)    Contact information:   [address] [place] [address] [phone]        The results of significant diagnostics from this hospitalization (including imaging, microbiology, ancillary and laboratory) are listed below for reference.    Significant Diagnostic Studies: Dg Chest 2 View  06/06/2013   *RADIOLOGY REPORT*  Clinical Data: Stroke.  CHEST - 2 VIEW  Comparison: 08/17/2012  Findings: Two views of the chest were obtained.  Lungs are clear without airspace disease or edema. Heart and mediastinum are within normal limits.  The trachea is midline and no evidence for pleural effusions.  IMPRESSION: No acute cardiopulmonary disease.   Original Report Authenticated By: Richarda Overlie, M.D.   Ct Head Wo Contrast  06/05/2013   *RADIOLOGY REPORT*  Clinical Data: Transient right arm weakness and facial weakness. New onset reading and spelling difficulty.  CT HEAD WITHOUT CONTRAST   Technique:  Contiguous axial images were obtained from the base of the skull through the vertex without contrast.  Comparison: None.  Findings: Minimally enlarged ventricles and subarachnoid spaces. Minimal patchy white matter low density in both cerebral hemispheres.  No intracranial hemorrhage, mass lesion or CT evidence of acute infarction.  Unremarkable bones.  Mucosal thickening involving all of the sinuses as well as a large left maxillary sinus retention cyst.  Small calcification in the posterior right globe at the optic nerve.  IMPRESSION:  1.  No acute abnormality. 2.  Minimal atrophy and minimal chronic small vessel white matter ischemic changes in both cerebral hemispheres. 3.  Mild chronic pansinusitis. 4.  Tiny posterior globe calcification on the right at the optic nerve  These results were called by telephone on 06/05/2013 at 2200 hours to Dr. Fonnie Jarvis, who verbally acknowledged these results.   Original Report Authenticated By: Beckie Salts, M.D.   Mr Brain Wo Contrast  06/06/2013   *RADIOLOGY REPORT*  Clinical Data:  Right-sided weakness.  Hypertensive hyperlipidemic overweight patients with history of tobacco use.  MRI BRAIN WITHOUT CONTRAST MRA HEAD WITHOUT CONTRAST  Technique: Multiplanar, multiecho pulse sequences of the brain and surrounding structures were obtained according to standard protocol without intravenous contrast.  Angiographic images of the head were obtained using MRA technique without contrast.  Comparison: 06/05/2013 CT.  No comparison MR.  MRI HEAD  Findings:  Moderate size acute partially hemorrhagic left parietal lobe infarct. There may be an acute and subacute components at this level with subacute component hemorrhagic.  No intracranial mass lesion detected on this unenhanced exam.  No hydrocephalus.  Diminutive size right vertebral artery please see below.  Prominent paranasal sinus mucosal thickening with polypoid appearance maxillary sinuses greater on the left with  polypoid lesion measures up to 3 cm.  Cervical medullary junction, pituitary region, pineal region and orbital structures unremarkable.  IMPRESSION:  Moderate size acute partially hemorrhagic left parietal lobe infarct. There may be an acute and subacute components  at this level with subacute component hemorrhagic.  Diminutive size right vertebral artery please see below.  Prominent paranasal sinus mucosal thickening with polypoid appearance maxillary sinuses greater on the left with polypoid lesion measures up to 3 cm.  MRA HEAD  Findings: Right internal carotid artery is slightly smaller than the left which may be related to right hypoplastic A1 segment configuration rather than proximal right internal carotid artery stenosis although the latter is not entirely excluded.  Fetal type origin posterior cerebral arteries bilaterally.  Mild narrowing right internal carotid artery supraclinoid segment.  Mild irregularity M1 segment right middle cerebral artery without high-grade stenosis.  Middle cerebral artery branch vessel irregularity bilaterally.  No significant stenosis M1 segment left middle cerebral artery.  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  No significant stenosis of the left vertebral artery or basilar artery.  Nonvisualization AICAs.  Narrowed irregular superior cerebellar artery bilaterally.  Mild narrowing P1 segment left posterior cerebral artery. Decreased caliber of the right posterior cerebral artery compared to the left. Posterior cerebral artery branch vessel irregularity and narrowing greater on the right.  No aneurysm or vascular malformation noted.  IMPRESSION: Intracranial atherosclerotic type changes as noted above.  Critical Value/emergent results were called by telephone at the time of interpretation on 06/06/2013 at 9:42 a.m. to Vernona Rieger patient's nurse, who verbally acknowledged these results.   Original Report Authenticated By: Lacy Duverney,  M.D.   Mr Mra Head/brain Wo Cm  06/06/2013   *RADIOLOGY REPORT*  Clinical Data:  Right-sided weakness.  Hypertensive hyperlipidemic overweight patients with history of tobacco use.  MRI BRAIN WITHOUT CONTRAST MRA HEAD WITHOUT CONTRAST  Technique: Multiplanar, multiecho pulse sequences of the brain and surrounding structures were obtained according to standard protocol without intravenous contrast.  Angiographic images of the head were obtained using MRA technique without contrast.  Comparison: 06/05/2013 CT.  No comparison MR.  MRI HEAD  Findings:  Moderate size acute partially hemorrhagic left parietal lobe infarct. There may be an acute and subacute components at this level with subacute component hemorrhagic.  No intracranial mass lesion detected on this unenhanced exam.  No hydrocephalus.  Diminutive size right vertebral artery please see below.  Prominent paranasal sinus mucosal thickening with polypoid appearance maxillary sinuses greater on the left with polypoid lesion measures up to 3 cm.  Cervical medullary junction, pituitary region, pineal region and orbital structures unremarkable.  IMPRESSION:  Moderate size acute partially hemorrhagic left parietal lobe infarct. There may be an acute and subacute components at this level with subacute component hemorrhagic.  Diminutive size right vertebral artery please see below.  Prominent paranasal sinus mucosal thickening with polypoid appearance maxillary sinuses greater on the left with polypoid lesion measures up to 3 cm.  MRA HEAD  Findings: Right internal carotid artery is slightly smaller than the left which may be related to right hypoplastic A1 segment configuration rather than proximal right internal carotid artery stenosis although the latter is not entirely excluded.  Fetal type origin posterior cerebral arteries bilaterally.  Mild narrowing right internal carotid artery supraclinoid segment.  Mild irregularity M1 segment right middle cerebral artery  without high-grade stenosis.  Middle cerebral artery branch vessel irregularity bilaterally.  No significant stenosis M1 segment left middle cerebral artery.  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  No significant stenosis of the left vertebral artery or basilar artery.  Nonvisualization AICAs.  Narrowed irregular superior cerebellar artery bilaterally.  Mild narrowing P1 segment left  posterior cerebral artery. Decreased caliber of the right posterior cerebral artery compared to the left. Posterior cerebral artery branch vessel irregularity and narrowing greater on the right.  No aneurysm or vascular malformation noted.  IMPRESSION: Intracranial atherosclerotic type changes as noted above.  Critical Value/emergent results were called by telephone at the time of interpretation on 06/06/2013 at 9:42 a.m. to Vernona Rieger patient's nurse, who verbally acknowledged these results.   Original Report Authenticated By: Lacy Duverney, M.D.    Microbiology: No results found for this or any previous visit (from the past 240 hour(s)).   Labs: Basic Metabolic Panel:  Recent Labs Lab 06/05/13 2201 06/05/13 2216 06/08/13 0435  NA 138 139 138  K 4.2 4.3 4.3  CL 103 107 102  CO2 22  --  28  GLUCOSE 89 92 102*  BUN 25* 25* 18  CREATININE 1.12 1.30 1.13  CALCIUM 9.5  --  9.6   Liver Function Tests:  Recent Labs Lab 06/05/13 2201  AST 76*  ALT 108*  ALKPHOS 73  BILITOT 0.5  PROT 7.4  ALBUMIN 4.5   No results found for this basename: LIPASE, AMYLASE,  in the last 168 hours No results found for this basename: AMMONIA,  in the last 168 hours CBC:  Recent Labs Lab 06/05/13 2201 06/05/13 2216 06/08/13 0435  WBC 10.7*  --  9.1  NEUTROABS 5.4  --   --   HGB 14.7 15.3 14.7  HCT 41.0 45.0 42.5  MCV 87.2  --  87.3  PLT 220  --  235   Cardiac Enzymes:  Recent Labs Lab 06/05/13 2201  TROPONINI <0.30   BNP: BNP (last 3 results) No results found for this  basename: PROBNP,  in the last 8760 hours CBG: No results found for this basename: GLUCAP,  in the last 168 hours     Signed:  [name]  [facility] 06/08/2013, 12:53 PM

## 2013-06-08 NOTE — Progress Notes (Signed)
Stroke Team Progress Note  HISTORY Marc Hansen is a 52 y.o. male with a history of hypertension who presents after a transient episode of right-sided weakness and aphasia. On the ED physician's exam, it was noted that he had not completely resolved and therefore code stroke was activated. He states that when he was eating dinner, he had right arm weakness and difficulty speaking the lasted for about 15 minutes. This is mostly resolved, but he still has difficulty with texting and reading. Patient was not a TPA candidate secondary to minimal deficit.  SUBJECTIVE Patient excited to be going home today.  OBJECTIVE Most recent Vital Signs: Filed Vitals:   06/07/13 2000 06/08/13 0000 06/08/13 0400 06/08/13 0650  BP: 129/77 147/87 113/79 127/97  Pulse: 89 96 80   Temp: 98 F (36.7 C) 98.1 F (36.7 C) 97.9 F (36.6 C)   TempSrc: Oral Oral Oral   Resp: 18 18 18    Height:      Weight:      SpO2: 97% 98% 99%    CBG (last 3)  No results found for this basename: GLUCAP,  in the last 72 hours  IV Fluid Intake:     MEDICATIONS  . aspirin  300 mg Rectal Daily   Or  . aspirin  325 mg Oral Daily  . atorvastatin  10 mg Oral q1800  . cholecalciferol  1,000 Units Oral Daily  . diltiazem  30 mg Oral Q6H  . enoxaparin (LOVENOX) injection  40 mg Subcutaneous Q24H  . folic acid  1 mg Oral Daily  . LORazepam  0-4 mg Intravenous Q12H  . losartan  50 mg Oral Daily  . multivitamin with minerals  1 tablet Oral Daily  . omega-3 acid ethyl esters  2 g Oral BID  . pantoprazole  40 mg Oral Daily  . thiamine  100 mg Oral Daily   Or  . thiamine  100 mg Intravenous Daily   PRN:  albuterol, albuterol, LORazepam, LORazepam, nitroGLYCERIN, senna-docusate, sodium chloride  Diet:  Cardiac thin liquids Activity:  Up with assistance DVT Prophylaxis:  Lovenox  CLINICALLY SIGNIFICANT STUDIES Basic Metabolic Panel:   Recent Labs Lab 06/05/13 2201 06/05/13 2216 06/08/13 0435  NA 138 139 138  K 4.2 4.3  4.3  CL 103 107 102  CO2 22  --  28  GLUCOSE 89 92 102*  BUN 25* 25* 18  CREATININE 1.12 1.30 1.13  CALCIUM 9.5  --  9.6   Liver Function Tests:   Recent Labs Lab 06/05/13 2201  AST 76*  ALT 108*  ALKPHOS 73  BILITOT 0.5  PROT 7.4  ALBUMIN 4.5   CBC:   Recent Labs Lab 06/05/13 2201 06/05/13 2216 06/08/13 0435  WBC 10.7*  --  9.1  NEUTROABS 5.4  --   --   HGB 14.7 15.3 14.7  HCT 41.0 45.0 42.5  MCV 87.2  --  87.3  PLT 220  --  235   Coagulation:   Recent Labs Lab 06/05/13 2201  LABPROT 13.5  INR 1.04   Cardiac Enzymes:   Recent Labs Lab 06/05/13 2201  TROPONINI <0.30   Urinalysis:   Recent Labs Lab 06/05/13 2227  COLORURINE YELLOW  LABSPEC 1.025  PHURINE 5.0  GLUCOSEU NEGATIVE  HGBUR NEGATIVE  BILIRUBINUR NEGATIVE  KETONESUR NEGATIVE  PROTEINUR NEGATIVE  UROBILINOGEN 0.2  NITRITE NEGATIVE  LEUKOCYTESUR NEGATIVE   Lipid Panel    Component Value Date/Time   CHOL 187 06/06/2013 0425   TRIG 119 06/06/2013  0425   HDL 31* 06/06/2013 0425   CHOLHDL 6.0 06/06/2013 0425   VLDL 24 06/06/2013 0425   LDLCALC 132* 06/06/2013 0425   HgbA1C  Lab Results  Component Value Date   HGBA1C 5.8* 06/06/2013    Urine Drug Screen:     Component Value Date/Time   LABOPIA POSITIVE* 06/05/2013 2227   COCAINSCRNUR NONE DETECTED 06/05/2013 2227   LABBENZ NONE DETECTED 06/05/2013 2227   AMPHETMU NONE DETECTED 06/05/2013 2227   THCU POSITIVE* 06/05/2013 2227   LABBARB NONE DETECTED 06/05/2013 2227    Alcohol Level:   Recent Labs Lab 06/05/13 2201  ETH 20*    CT of the brain  06/05/2013  1. No acute abnormality. 2. Minimal atrophy and minimal chronic small vessel white matter ischemic changes in both cerebral hemispheres.  3. mild chronic pansinusitis. 4. Tiny posterior globe calcification on the right at the optic nerve  MRI of the brain  06/06/2013 Moderate size acute partially hemorrhagic left parietal lobe infarct. There may be an acute and subacute components  at this level with subacute component hemorrhagic. Diminutive size right vertebral artery please see below. Prominent paranasal sinus mucosal thickening with polypoid appearance maxillary sinuses greater on the left with polypoid lesion measures up to 3 cm.  MRA of the brain  06/06/2013 Intracranial atherosclerotic type changes.  2D Echocardiogram  EF 45-50% with no source of embolus. Mitral valve: Moderate to severe regurgitation.  Carotid Doppler  No evidence of hemodynamically significant internal carotid artery stenosis. Vertebral artery flow is antegrade.   CXR  06/06/2013    No acute cardiopulmonary disease.    EKG  ATRIAL FIBRILLATION, V-RATE 79-149 ~ var'd rate, irreg atrial activity BASELINE WANDER IN LEAD(S) II,III,aVF  Therapy Recommendations no PT, OP ST  Physical Exam  General: The patient is alert and cooperative at the time of the examination.  Skin: No significant peripheral edema is noted.   Neurologic Exam  Cranial nerves: Facial symmetry is present. Speech is normal, no aphasia or dysarthria is noted. Extraocular movements are full. Visual fields are full.  Motor: The patient has good strength in all 4 extremities.  Coordination: The patient has good finger-nose-finger and heel-to-shin bilaterally.  Gait and station: The patient has a normal gait. Tandem gait is normal. Romberg is negative. No drift is seen.  Reflexes: Deep tendon reflexes are symmetric.  ASSESSMENT Mr. Marc Hansen is a 52 y.o. male presenting with a right hemiparesis, and speech issue. MRI confirms a left parietal stroke with hemorrhagic tranformation. Stroke found to be embolic due to new onset atrial fibrillation. The patient reports a high level alcohol intake, drinking 6 beers daily, more on the weekends. This may be an etiology of his atrial fibrillation. Minimal deficits remaining   Left parietal cardioembolic stroke  atrial fibrillation, new onset   Alcohol abuse  Tobacco  abuse  THC positive. This has been recently linked to acute stroke.  Hypertension Hyperlipidemia, LDL 132, on lovaza alone PTA, now on lovaza and lipitor, goal LDL < 100 (< 70 for diabetics) HgbA1c 5.8  Hospital day # 3  TREATMENT/PLAN  Ok to start  NOAC today - will start Pradaxa 150 mg bid. Discontinue aspirin.   No OP therapy needed  Recommend cardiology follow up of afib.  Return to work in ~3 weeks if remains stable No further stroke workup indicated. Ok for discharge from stroke standpoint Patient has a 10-15% risk of having another stroke over the next year, the highest risk is within 2  weeks of the most recent stroke/TIA (risk of having a stroke following a stroke or TIA is the same). Ongoing risk factor control by Primary Care Physician Stroke Service will sign off. Please call should any needs arise. Follow up with Dr. Pearlean Brownie, Stroke Clinic, in 2 months. Marland Kitchen Annie Main, MSN, RN, ANVP-BC, ANP-BC, [facility] Pager: [phone] 06/08/2013 9:04 AM  I have personally obtained a history, examined the patient, evaluated imaging results, and formulated the assessment and plan of care. I agree with the above. Lesly Dukes

## 2013-06-08 NOTE — Care Management (Signed)
Case Manager provided pt with a discount card for pradaxa. Pt states he will not need any speech therapy f/u. No further needs from CM at this time. Gala Lewandowsky, RN,BSN [phone]

## 2013-06-09 ENCOUNTER — Telehealth: Payer: Self-pay | Admitting: Family Medicine

## 2013-06-09 DIAGNOSIS — I10 Essential (primary) hypertension: Secondary | ICD-10-CM

## 2013-06-09 MED ORDER — LOSARTAN POTASSIUM 50 MG PO TABS: 50.0000 mg | ORAL_TABLET | Freq: Every day | ORAL | Status: DC

## 2013-06-09 NOTE — Telephone Encounter (Signed)
Refill-losartan potassium 50mg  tab. Take one tablet by mouth every day. Needs appointment for more refills. Qty 30 last fill 5.8.14

## 2013-06-10 ENCOUNTER — Encounter: Payer: Self-pay | Admitting: Family Medicine

## 2013-06-10 ENCOUNTER — Ambulatory Visit (INDEPENDENT_AMBULATORY_CARE_PROVIDER_SITE_OTHER): Payer: BC Managed Care – PPO | Admitting: Family Medicine

## 2013-06-10 VITALS — BP 136/83 | HR 85 | Temp 97.8°F | Resp 16 | Ht 75.0 in | Wt 231.0 lb

## 2013-06-10 DIAGNOSIS — I10 Essential (primary) hypertension: Secondary | ICD-10-CM

## 2013-06-10 DIAGNOSIS — K769 Liver disease, unspecified: Secondary | ICD-10-CM

## 2013-06-10 DIAGNOSIS — R945 Abnormal results of liver function studies: Secondary | ICD-10-CM

## 2013-06-10 DIAGNOSIS — I059 Rheumatic mitral valve disease, unspecified: Secondary | ICD-10-CM

## 2013-06-10 DIAGNOSIS — M129 Arthropathy, unspecified: Secondary | ICD-10-CM

## 2013-06-10 DIAGNOSIS — I6350 Cerebral infarction due to unspecified occlusion or stenosis of unspecified cerebral artery: Secondary | ICD-10-CM

## 2013-06-10 DIAGNOSIS — I639 Cerebral infarction, unspecified: Secondary | ICD-10-CM

## 2013-06-10 DIAGNOSIS — F17201 Nicotine dependence, unspecified, in remission: Secondary | ICD-10-CM

## 2013-06-10 DIAGNOSIS — Z87891 Personal history of nicotine dependence: Secondary | ICD-10-CM

## 2013-06-10 DIAGNOSIS — M1990 Unspecified osteoarthritis, unspecified site: Secondary | ICD-10-CM

## 2013-06-10 DIAGNOSIS — I4891 Unspecified atrial fibrillation: Secondary | ICD-10-CM

## 2013-06-10 DIAGNOSIS — I519 Heart disease, unspecified: Secondary | ICD-10-CM

## 2013-06-10 DIAGNOSIS — I340 Nonrheumatic mitral (valve) insufficiency: Secondary | ICD-10-CM

## 2013-06-10 MED ORDER — ATORVASTATIN CALCIUM 10 MG PO TABS: 10.0000 mg | ORAL_TABLET | Freq: Every day | ORAL | Status: DC

## 2013-06-10 MED ORDER — HYDROCODONE-ACETAMINOPHEN 5-325 MG PO TABS: 1.0000 | ORAL_TABLET | Freq: Three times a day (TID) | ORAL | Status: DC | PRN

## 2013-06-10 MED ORDER — LOSARTAN POTASSIUM 50 MG PO TABS: 50.0000 mg | ORAL_TABLET | Freq: Every day | ORAL | Status: DC

## 2013-06-10 MED ORDER — TRAMADOL HCL 50 MG PO TABS: ORAL_TABLET | ORAL | Status: DC

## 2013-06-10 NOTE — Assessment & Plan Note (Signed)
New dx in hospital. Doing well on rate control with cardizem and pradaxa for anticoagulation. Will refer to cardiology for further evaluation and management for this dx plus his mildly reduced EF and his moderate/severe mitral regurgitation.

## 2013-06-10 NOTE — Assessment & Plan Note (Signed)
He only has an occasional cigarette now--he has essentially quit.

## 2013-06-10 NOTE — Progress Notes (Signed)
Office Note 06/10/2013  CC:  Chief Complaint  Patient presents with  . Hospitalization Follow-up  . Referral    to cardiologist for a fib and recent stroke    HPI:  Marc Hansen is a 52 y.o. White male who is here today (with his wife) to transfer his care to me (from Dr. Abner Greenspan) and for hospital follow up.  I reviewed all hospital records. He was in hosp 6/14 to 6/17 for acute left parietal lobe CVA, was dx'd with new onset atrial fibrillation.  Echo showed slightly diminished EF and mod/severe mitral valve regurg. He was started on pradaxa 150mg  bid for anticoagulation and put on cardizem for rate control.  He was also started on a statin.  Says he feels well now.  No weakness, no sensory abnormalities, and no speech or swallowing problems.  Had a HA up until yesterday--gone since then. Denies palpitations or heart racing or dizziness.  No chest pain.  Past Medical History  Diagnosis Date  . Allergy     seasonal  . Hypertension   . Overweight(278.02) 04/17/2011  . Arthritis 04/17/2011  . GERD (gastroesophageal reflux disease) 04/17/2011  . Vitamin D deficiency 04/17/2011  . Hand crush injury 07/05/2011  . Umbilical hernia 07/05/2011  . Hyperlipidemia 07/16/2011  . Visual changes 07/16/2011  . Atrial fibrillation 06/05/13    New dx in hosp for w/u for CVA--pradaxa started  . Abnormal liver function 08/20/2012  . 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 Medial epicondylitis of right elbow 11/09/2012  . DOE (dyspnea on exertion) 11/09/2012  . CVA (cerebral infarction) 06/05/13    A-fib new dx.  Carotid dopplers neg bilat.  MRI brain: moderate sized partially hemorrhagic left parietal lobe infarct.  Diffuse intracranial atherosclerotic changes noted on MRA brain.  . Stroke   . Chest pain, atypical 03/12/2012    Past Surgical History  Procedure Laterality Date  . Vasectomy    . Knee surgery    . Carpal tunnel release      right, needed some nerve repair as well  . Right knee  arthroscopy    . Transthoracic echocardiogram  06/05/13    LAE, EF 45-50%, mod/severe mitral regurg    Family History  Problem Relation Age of Onset  . Cancer Mother 54    breast- remission  . Fibromyalgia Mother   . Other Father     CHF- stent   . Heart attack Father   . Heart disease Maternal Uncle   . Heart disease Maternal Grandmother   . Heart disease Maternal Grandfather     History   Social History  . Marital Status: Married    Spouse Name: N/A    Number of Children: N/A  . Years of Education: N/A   Occupational History  . Not on file.   Social History Main Topics  . Smoking status: Current Some Day Smoker -- 0.30 packs/day    Types: Cigarettes  . Smokeless tobacco: Never Used  . Alcohol Use: Yes     Comment: 24 beers weekly  . Drug Use: No  . Sexually Active: Yes -- Male partner(s)   Other Topics Concern  . Not on file   Social History Narrative  . No narrative on file    Outpatient Prescriptions Prior to Visit  Medication Sig Dispense Refill  . albuterol (PROVENTIL HFA;VENTOLIN HFA) 108 (90 BASE) MCG/ACT inhaler Inhale 2 puffs into the lungs as needed.  1 Inhaler  11  . albuterol (PROVENTIL) (2.5 MG/3ML) 0.083%  nebulizer solution Take 3 mLs (2.5 mg total) by nebulization every 6 (six) hours as needed.  100 vial  2  . b complex vitamins tablet Take 1 tablet by mouth daily.      . cholecalciferol (VITAMIN D) 1000 UNITS tablet Take 1,000 Units by mouth daily.      . dabigatran (PRADAXA) 150 MG CAPS Take 1 capsule (150 mg total) by mouth every 12 (twelve) hours.  60 capsule  0  . diltiazem (CARDIZEM CD) 120 MG 24 hr capsule Take 1 capsule (120 mg total) by mouth daily.  30 capsule  0  . fish oil-omega-3 fatty acids 1000 MG capsule Take 2 capsules (2 g total) by mouth 2 (two) times daily.      . folic acid (FOLVITE) 1 MG tablet Take 1 tablet (1 mg total) by mouth daily.  30 tablet  0  . MILK THISTLE PO Take 1 tablet by mouth daily.      . multivitamin  (THERAGRAN) per tablet Take 1 tablet by mouth daily.        . nitroGLYCERIN (NITROSTAT) 0.4 MG SL tablet Place 1 tablet (0.4 mg total) under the tongue every 5 (five) minutes as needed for chest pain.  25 tablet  1  . omeprazole (PRILOSEC) 40 MG capsule Take 40 mg by mouth every 3 (three) days.      Marland Kitchen thiamine 100 MG tablet Take 1 tablet (100 mg total) by mouth daily.  30 tablet  0  . HYDROcodone-acetaminophen (NORCO/VICODIN) 5-325 MG per tablet Take 1 tablet by mouth every 8 (eight) hours as needed for pain.  60 tablet  1  . losartan (COZAAR) 50 MG tablet Take 1 tablet (50 mg total) by mouth daily. NEEDS APPT FOR MORE REFILLS.  30 tablet  0  . atorvastatin (LIPITOR) 10 MG tablet Take 1 tablet (10 mg total) by mouth daily at 6 PM.  30 tablet  0   No facility-administered medications prior to visit.    No Known Allergies  ROS Review of Systems  Constitutional: Negative for fever and fatigue.  HENT: Negative for congestion and sore throat.   Eyes: Negative for visual disturbance.  Respiratory: Negative for cough.   Cardiovascular: Negative for chest pain.  Gastrointestinal: Negative for nausea and abdominal pain.  Genitourinary: Negative for dysuria.  Musculoskeletal: Positive for joint swelling (chronic left hand 1st MTP joint pain and swelling, without redness or warmth: +distant hx of injury to thumb.). Negative for back pain and arthralgias.  Skin: Negative for rash.  Neurological: Negative for tremors, weakness and light-headedness.  Hematological: Negative for adenopathy. Does not bruise/bleed easily.  Psychiatric/Behavioral: Negative for dysphoric mood.    PE; Blood pressure 136/83, pulse 85, temperature 97.8 F (36.6 C), temperature source Oral, resp. rate 16, height 6\' 3"  (1.905 m), weight 231 lb (104.781 kg), SpO2 97.00%. Gen: Alert, well appearing.  Patient is oriented to person, place, time, and situation. ENT:  Eyes: no injection, icteris, swelling, or exudate.  EOMI,  PERRLA. Nose: no drainage or turbinate edema/swelling.  No injection or focal lesion.  Mouth: lips without lesion/swelling.  Oral mucosa pink and moist.    Oropharynx without erythema, exudate, or swelling.  Neck: supple/nontender.  No LAD, mass, or TM.  Carotid pulses 2+ bilaterally, without bruits. CV: Irreg Irreg, rate approx 80, no m/r.   LUNGS: CTA bilat, nonlabored resps. ABD: soft, NT/ND EXT: no clubbing, cyanosis, or edema.  Left thumb with MTP region swelling on palmar aspect, with pain with  ROM of this joint and tenderness to palpation here.  No erythema or warmth. Neuro: CN 2-12 intact bilaterally, strength 5/5 in proximal and distal upper extremities and lower extremities bilaterally.  No sensory deficits.  No tremor.  No disdiadochokinesis.  No ataxia.  Upper extremity and lower extremity DTRs symmetric.  No pronator drift.  Pertinent labs:  None today  ASSESSMENT AND PLAN:   Transfer patient.  CVA (cerebral infarction) No residual deficit. Presumed cardioembolic etiology given new dx atrial fib in conjunction with CVA.  However, MRA did show diffuse intracranial atherosclerosis. Continue pradaxa + bp control, smoking cessation, statin. He is a bit wary of taking the statin--I tried to reassure him and let him know that the benefits of this med for him outweigh the risks at this time.  Atrial fibrillation New dx in hospital. Doing well on rate control with cardizem and pradaxa for anticoagulation. Will refer to cardiology for further evaluation and management for this dx plus his mildly reduced EF and his moderate/severe mitral regurgitation.  Arthritis Left MTP joint, chronic pain.  He is not open to any further w/u or referral for this at this time--says he has to work and can't take time off for surg/recoup or extra MD visits, etc. He is aware he can't use NSAIDs now. Will start tramadol 50mg , 1-2 q6h prn.  He can have vicodin for use at night or when he is not  working.  Tobacco abuse, in remission He only has an occasional cigarette now--he has essentially quit.  Abnormal liver function Will follow closely now that he is on a statin. I can't find record of a work-up for this problem so at next f/u will do Hep B and Hep C testing and discuss getting an abd u/s with pt.   An After Visit Summary was printed and given to the patient.  FOLLOW UP:  Return in about 2 months (around 08/10/2013) for morning appt so fasting labs can be done.

## 2013-06-10 NOTE — Assessment & Plan Note (Signed)
Left MTP joint, chronic pain.  He is not open to any further w/u or referral for this at this time--says he has to work and can't take time off for surg/recoup or extra MD visits, etc. He is aware he can't use NSAIDs now. Will start tramadol 50mg , 1-2 q6h prn.  He can have vicodin for use at night or when he is not working.

## 2013-06-10 NOTE — Assessment & Plan Note (Addendum)
No residual deficit. Presumed cardioembolic etiology given new dx atrial fib in conjunction with CVA.  However, MRA did show diffuse intracranial atherosclerosis. Continue pradaxa + bp control, smoking cessation, statin. He is a bit wary of taking the statin--I tried to reassure him and let him know that the benefits of this med for him outweigh the risks at this time.

## 2013-06-10 NOTE — Assessment & Plan Note (Addendum)
Will follow closely now that he is on a statin. I can't find record of a work-up for this problem so at next f/u will do Hep B and Hep C testing and discuss getting an abd u/s with pt.

## 2013-06-16 ENCOUNTER — Ambulatory Visit (INDEPENDENT_AMBULATORY_CARE_PROVIDER_SITE_OTHER): Payer: BC Managed Care – PPO | Admitting: Cardiology

## 2013-06-16 ENCOUNTER — Encounter: Payer: Self-pay | Admitting: Cardiology

## 2013-06-16 VITALS — BP 140/80 | HR 77 | Wt 231.0 lb

## 2013-06-16 DIAGNOSIS — I340 Nonrheumatic mitral (valve) insufficiency: Secondary | ICD-10-CM | POA: Insufficient documentation

## 2013-06-16 DIAGNOSIS — I10 Essential (primary) hypertension: Secondary | ICD-10-CM

## 2013-06-16 DIAGNOSIS — I4891 Unspecified atrial fibrillation: Secondary | ICD-10-CM

## 2013-06-16 DIAGNOSIS — E785 Hyperlipidemia, unspecified: Secondary | ICD-10-CM

## 2013-06-16 DIAGNOSIS — Z87891 Personal history of nicotine dependence: Secondary | ICD-10-CM

## 2013-06-16 DIAGNOSIS — I059 Rheumatic mitral valve disease, unspecified: Secondary | ICD-10-CM

## 2013-06-16 DIAGNOSIS — F17201 Nicotine dependence, unspecified, in remission: Secondary | ICD-10-CM

## 2013-06-16 MED ORDER — PRAVASTATIN SODIUM 40 MG PO TABS: 40.0000 mg | ORAL_TABLET | Freq: Every evening | ORAL | Status: DC

## 2013-06-16 NOTE — Assessment & Plan Note (Addendum)
Patient is noted to have moderate to severe mitral regurgitation on recent echocardiogram by report. His LV function is also mildly reduced. His mitral regurgitation may be contributing to left atrial enlargement and be the cause of his atrial fibrillation. Plan to repeat his echocardiogram in approximately 10 weeks. If it appears to be severe we will plan TEE to better assess. He may need to have mitral valve surgery in the future but I would like for him to recover from a CVA.   I have reviewed the patient's previous transthoracic echocardiogram. His ejection fraction appears to be 40-45%. He has moderate to severe mitral regurgitation. I will arrange a transesophageal echocardiogram to better assess valve morphology and severity of mitral regurgitation. It may be that his mitral regurgitation caused left atrial enlargement which led to his atrial fibrillation which then led to his CVA. He may require mitral valve surgery but we will make this decision once we perform his transesophageal echocardiogram. He would also need to recover from his recent CVA. Note we performed a Holter monitor and his heart rate is elevated during the day and he is bradycardic in the early a.m. hours. Question if tachycardia is contributing to his cardiomyopathy. Add atenolol 25 mg by mouth q a.m. I will continue his pradaxa for now but will ultimately transition to coumadin given his valvular heart disease. I am hesitant to make a transition now as we are particularly close to his recent presumed embolic CVA.

## 2013-06-16 NOTE — Assessment & Plan Note (Signed)
Discontinue Lipitor for financial reasons and treat with Pravachol 40 mg daily. Check lipids and liver in 6 weeks.

## 2013-06-16 NOTE — Assessment & Plan Note (Signed)
Continue present blood pressure medications. 

## 2013-06-16 NOTE — Patient Instructions (Addendum)
Your physician recommends that you schedule a follow-up appointment in: 3 MONTHS WITH DR Jens Som  Your physician has recommended that you wear a 48 HOUR holter monitor. Holter monitors are medical devices that record the heart's electrical activity. Doctors most often use these monitors to diagnose arrhythmias. Arrhythmias are problems with the speed or rhythm of the heartbeat. The monitor is a small, portable device. You can wear one while you do your normal daily activities. This is usually used to diagnose what is causing palpitations/syncope (passing out).    Your physician recommends that you return for lab work in: 4 WEEKS IN [place]= FASTING  Your physician has requested that you have an echocardiogram. Echocardiography is a painless test that uses sound waves to create images of your heart. It provides your doctor with information about the size and shape of your heart and how well your heart's chambers and valves are working. This procedure takes approximately one hour. There are no restrictions for this procedure.   START PRAVASTATIN 40 MG ONCE DAILY AT BEDTIME

## 2013-06-16 NOTE — Assessment & Plan Note (Signed)
Patient counseled on discontinuing. 

## 2013-06-16 NOTE — Progress Notes (Signed)
HPI: 52 year old male for evaluation of atrial fibrillation. Patient admitted to [facility] in June of 2014 with a CVA. Echocardiogram in June 2014 showed an ejection fraction of 45-50%, moderate left atrial enlargement and moderate to severe mitral regurgitation. Carotid Dopplers in June 2014 showed less than 40% stenosis on the left and 0-39% stenosis on the right. 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 TSH 2.08. Since discharge, he denies dyspnea, palpitations or syncope. No bleeding. He has an occasional brief pain in the left chest area not related to exertion. It lasts less than 1 minute and resolves spontaneously.  Current Outpatient Prescriptions  Medication Sig Dispense Refill  . albuterol (PROVENTIL HFA;VENTOLIN HFA) 108 (90 BASE) MCG/ACT inhaler Inhale 2 puffs into the lungs as needed.  1 Inhaler  11  . albuterol (PROVENTIL) (2.5 MG/3ML) 0.083% nebulizer solution Take 3 mLs (2.5 mg total) by nebulization every 6 (six) hours as needed.  100 vial  2  . b complex vitamins tablet Take 1 tablet by mouth daily.      . cholecalciferol (VITAMIN D) 1000 UNITS tablet Take 1,000 Units by mouth daily.      . dabigatran (PRADAXA) 150 MG CAPS Take 1 capsule (150 mg total) by mouth every 12 (twelve) hours.  60 capsule  0  . diltiazem (CARDIZEM CD) 120 MG 24 hr capsule Take 1 capsule (120 mg total) by mouth daily.  30 capsule  0  . fish oil-omega-3 fatty acids 1000 MG capsule Take 2 capsules (2 g total) by mouth 2 (two) times daily.      . folic acid (FOLVITE) 1 MG tablet Take 1 tablet (1 mg total) by mouth daily.  30 tablet  0  . HYDROcodone-acetaminophen (NORCO/VICODIN) 5-325 MG per tablet Take 1 tablet by mouth every 8 (eight) hours as needed for pain.  60 tablet  1  . losartan (COZAAR) 50 MG tablet Take 1 tablet (50 mg total) by mouth daily.  90 tablet  2  . MILK THISTLE PO Take 1 tablet by mouth daily.      . multivitamin (THERAGRAN) per tablet  Take 1 tablet by mouth daily.        . nitroGLYCERIN (NITROSTAT) 0.4 MG SL tablet Place 1 tablet (0.4 mg total) under the tongue every 5 (five) minutes as needed for chest pain.  25 tablet  1  . omeprazole (PRILOSEC) 40 MG capsule Take 40 mg by mouth every 3 (three) days.      Marland Kitchen thiamine 100 MG tablet Take 1 tablet (100 mg total) by mouth daily.  30 tablet  0  . traMADol (ULTRAM) 50 MG tablet 1-2 tabs po tid prn pain  60 tablet  2  . [DISCONTINUED] esomeprazole (NEXIUM) 40 MG capsule Take 1 capsule (40 mg total) by mouth daily.  30 capsule  5  . [DISCONTINUED] Fluticasone Propionate, Inhal, (FLOVENT DISKUS) 100 MCG/BLIST AEPB Prn   60 each  1  . [DISCONTINUED] loratadine (CLARITIN) 10 MG tablet Take 1 tablet (10 mg total) by mouth daily.  30 tablet  2   No current facility-administered medications for this visit.    No Known Allergies  Past Medical History  Diagnosis Date  . Allergy     seasonal  . Hypertension   . Overweight(278.02) 04/17/2011  . Arthritis 04/17/2011  . GERD (gastroesophageal reflux disease) 04/17/2011  . Vitamin D deficiency 04/17/2011  . Hand crush injury 07/05/2011  . Umbilical hernia 07/05/2011  . Hyperlipidemia 07/16/2011  .  Atrial fibrillation 06/05/13    New dx in hosp for w/u for CVA--pradaxa started  . Abnormal liver function 08/20/2012  . Medial epicondylitis of right elbow 11/09/2012  . CVA (cerebral infarction) 06/05/13    A-fib new dx.  Carotid dopplers neg bilat.  MRI brain: moderate sized partially hemorrhagic left parietal lobe infarct.  Diffuse intracranial atherosclerotic changes noted on MRA brain.    Past Surgical History  Procedure Laterality Date  . Vasectomy    . Carpal tunnel release      right, needed some nerve repair as well  . Right knee arthroscopy      History   Social History  . Marital Status: Married    Spouse Name: N/A    Number of Children: 3  . Years of Education: N/A   Occupational History  .     Social History Main  Topics  . Smoking status: Current Some Day Smoker -- 0.30 packs/day    Types: Cigarettes  . Smokeless tobacco: Never Used  . Alcohol Use: Yes     Comment: 24 beers weekly  . Drug Use: No  . Sexually Active: Yes -- Male partner(s)   Other Topics Concern  . Not on file   Social History Narrative  . No narrative on file    Family History  Problem Relation Age of Onset  . Cancer Mother 26    breast- remission  . Fibromyalgia Mother   . Other Father     CHF- stent   . Heart attack Father   . Heart disease Maternal Uncle   . Heart disease Maternal Grandmother   . Heart disease Maternal Grandfather     ROS: no fevers or chills, productive cough, hemoptysis, dysphasia, odynophagia, melena, hematochezia, dysuria, hematuria, rash, seizure activity, orthopnea, PND, pedal edema, claudication. Remaining systems are negative.  Physical Exam:   Blood pressure 140/80, pulse 77, weight 231 lb (104.781 kg).  General:  Well developed/well nourished in NAD Skin warm/dry Patient not depressed No peripheral clubbing Back-normal HEENT-normal/normal eyelids Neck supple/normal carotid upstroke bilaterally; no bruits; no JVD; no thyromegaly chest - CTA/ normal expansion CV - irregular/normal S1 and S2; no murmurs, rubs or gallops;  PMI nondisplaced Abdomen -NT/ND, no HSM, no mass, + bowel sounds, no bruit 2+ femoral pulses, no bruits Ext-no edema, chords, 2+ DP Neuro-grossly nonfocal  ECG atrial fibrillation at a rate of 77. No ST changes.

## 2013-06-16 NOTE — Assessment & Plan Note (Signed)
The patient remains in atrial fibrillation. Continue Cardizem. I reviewed the options of treatment including rate control/anti-coagulation versus rhythm control. The patient is asymptomatic. I will continue rate control and anticoagulation at this point. Check holter monitor to make sure that rate is controlled. Continue pradaxa. Given his moderate to severe mitral regurgitation on echo we may need to discontinue this and instead treat with Coumadin. I would like to review this echocardiogram prior to making that decision. He will need lifelong anticoagulation.

## 2013-06-17 ENCOUNTER — Encounter (INDEPENDENT_AMBULATORY_CARE_PROVIDER_SITE_OTHER): Payer: BC Managed Care – PPO

## 2013-06-17 ENCOUNTER — Encounter: Payer: Self-pay | Admitting: *Deleted

## 2013-06-17 DIAGNOSIS — I4891 Unspecified atrial fibrillation: Secondary | ICD-10-CM

## 2013-06-17 NOTE — Progress Notes (Signed)
Patient ID: Marc Hansen, male   DOB: 04/12/1961, 52 y.o.   MRN: [id number] Evo 48 Hour Holter Monitor applied to patient.

## 2013-06-18 ENCOUNTER — Telehealth: Payer: Self-pay | Admitting: Cardiology

## 2013-06-18 NOTE — Telephone Encounter (Signed)
New Prob   Pt would like to speak to nurse regarding one of medications PRAVASTATIN. States he can not take this med. Please call.

## 2013-06-18 NOTE — Telephone Encounter (Signed)
Ok to take pravastatin Olga Millers

## 2013-06-18 NOTE — Telephone Encounter (Signed)
Spoke with patient who has a question regarding prolonged sunlight when taking Pravastatin.  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  Patient would like advice regarding this.  The office pharmacist is not in the office today.  I advised patient that I will send message to Dr. Jens Som for advice.

## 2013-06-18 NOTE — Telephone Encounter (Signed)
Called patient and informed him of Dr. Ludwig Clarks advice that patient can take medication.  Patient verbalized understanding.

## 2013-06-22 ENCOUNTER — Telehealth: Payer: Self-pay | Admitting: Neurology

## 2013-06-22 NOTE — Telephone Encounter (Signed)
Patient requesting return to work letter. Call when letter, will have a fax# to send letter to then.

## 2013-06-23 ENCOUNTER — Telehealth: Payer: Self-pay | Admitting: Neurology

## 2013-06-23 ENCOUNTER — Telehealth: Payer: Self-pay | Admitting: *Deleted

## 2013-06-23 MED ORDER — ATENOLOL 25 MG PO TABS
25.0000 mg | ORAL_TABLET | ORAL | Status: DC
Start: 2013-06-23 — End: 2013-08-02

## 2013-06-23 NOTE — Telephone Encounter (Signed)
Spoke with pt, Aware of dr Ludwig Clarks recommendations. Pt request the TEE be in about one month, okay per dr Jens Som. Will schedule and call the pt back.

## 2013-06-23 NOTE — Telephone Encounter (Signed)
Left message for pt to call, monitor reviewed by dr Jens Som show atrial fib, rate increased during the day with bradycardia early am hours. Pt to start atenolol 25 mg po daily in the morning.

## 2013-06-23 NOTE — Telephone Encounter (Signed)
Continue pradaxa; will decide about changing to coumadin after TEE and fu ov. Marc Hansen

## 2013-06-23 NOTE — Telephone Encounter (Signed)
Ok to do so. Keep f/u appointment with me

## 2013-06-23 NOTE — Telephone Encounter (Signed)
Follow up ° ° °Pt returned your call °

## 2013-06-23 NOTE — Telephone Encounter (Signed)
Spoke with pt, aware of monitor results. He wanted to know about cont the pradaxa. He reports dr Jens Som was going to review his latest echo and decide if it would be better for him to take coumadin instead. Will forward for dr Jens Som review

## 2013-06-23 NOTE — Telephone Encounter (Signed)
Spoke to patient. Advised letter ready. Patient said he will call back with fax# to send letter.

## 2013-06-24 ENCOUNTER — Telehealth: Payer: Self-pay | Admitting: *Deleted

## 2013-06-24 ENCOUNTER — Encounter: Payer: Self-pay | Admitting: *Deleted

## 2013-06-24 NOTE — Telephone Encounter (Signed)
Message copied by Hermenia Fiscal on Thu Jun 24, 2013  3:15 PM ------      Message from: Seth Bake      Created: Thu Jun 24, 2013 10:14 AM       His work received the work Scientist, research (medical) but it states "moderate" limitations.  They need to know what that means?  He works outside.   He can be reached at  [phone] if you have any questions.   Fax response to his employer at [phone].   ------

## 2013-06-24 NOTE — Telephone Encounter (Signed)
I called pt and relayed after consulting Dr. Pearlean Brownie that pt may return to work without restrictions.  Letter faxed to [phone].

## 2013-06-24 NOTE — Telephone Encounter (Signed)
Unable to schedule this far out. Pt made aware.

## 2013-06-24 NOTE — Telephone Encounter (Signed)
Faxed letter yesterday evening.

## 2013-07-08 ENCOUNTER — Telehealth: Payer: Self-pay

## 2013-07-08 MED ORDER — DABIGATRAN ETEXILATE MESYLATE 150 MG PO CAPS: 150.0000 mg | ORAL_CAPSULE | Freq: Two times a day (BID) | ORAL | Status: DC

## 2013-07-08 MED ORDER — DILTIAZEM HCL ER COATED BEADS 120 MG PO CP24: 120.0000 mg | ORAL_CAPSULE | Freq: Every day | ORAL | Status: DC

## 2013-07-08 NOTE — Telephone Encounter (Signed)
Per Carmelina Noun pt was to continue on pradaxa 150 mg bid and diltiazem 120mg  qd ok refill. Refill  sent into [facility]. Pt notified and verbalized understanding. Carmelina Noun will follow up w/pt.

## 2013-07-19 ENCOUNTER — Telehealth: Payer: Self-pay | Admitting: *Deleted

## 2013-07-19 NOTE — Telephone Encounter (Signed)
Called pt to discuss TEE procedure dr Jens Som wanted him to have in august. Pt reports he needs to put this off until the end of sept. Pt made aware he has a follow up with dr Jens Som in sept. Pt states he can not afford to have anything else done. He has gotten in touch with the billing department regarding his previous balance. He will call when he is wanting to schedule follow up testing. Will make dr Jens Som aware.

## 2013-07-23 ENCOUNTER — Telehealth: Payer: Self-pay | Admitting: *Deleted

## 2013-07-23 NOTE — Telephone Encounter (Signed)
We use prescribe 1 box of 30

## 2013-07-23 NOTE — Telephone Encounter (Signed)
Left msg on vm stating received script for pt albuterol Neb solution with quanity of 100. Need to verify if that is 100 individual vial which would be 270 ml, or 100 ml which would be 1 box of 30. Pls advise...lmb

## 2013-07-23 NOTE — Telephone Encounter (Signed)
Called [facility] spoke with June gave md response..left/llmb

## 2013-07-26 ENCOUNTER — Other Ambulatory Visit (HOSPITAL_COMMUNITY): Payer: Self-pay | Admitting: Cardiology

## 2013-07-26 ENCOUNTER — Telehealth: Payer: Self-pay | Admitting: Cardiology

## 2013-07-26 ENCOUNTER — Encounter: Payer: Self-pay | Admitting: *Deleted

## 2013-07-26 ENCOUNTER — Other Ambulatory Visit: Payer: Self-pay | Admitting: Family Medicine

## 2013-07-26 DIAGNOSIS — I059 Rheumatic mitral valve disease, unspecified: Secondary | ICD-10-CM

## 2013-07-26 DIAGNOSIS — J40 Bronchitis, not specified as acute or chronic: Secondary | ICD-10-CM

## 2013-07-26 MED ORDER — ALBUTEROL SULFATE HFA 108 (90 BASE) MCG/ACT IN AERS: 2.0000 | INHALATION_SPRAY | RESPIRATORY_TRACT | Status: DC | PRN

## 2013-07-26 NOTE — Telephone Encounter (Signed)
New problem   Pt calling to r/s tee

## 2013-07-26 NOTE — Telephone Encounter (Signed)
Patient states that he is traveling and has left his emergency inhaler(ventolin) at home. He would like to know if we could call in another rx to [facility] in [place]. P: [phone]. Patient would like a callback when rx has been sent.

## 2013-07-26 NOTE — Telephone Encounter (Signed)
Spoke with pt, aware TEE is scheduled with dr Jens Som on 08-30-13 @ 1pm. Letter of instructions mailed to the pts home address.

## 2013-07-26 NOTE — Telephone Encounter (Signed)
Patient informed that RX was faxed to pharmacy

## 2013-07-26 NOTE — Telephone Encounter (Signed)
OK to refill Ventolin to [place] [facility] and have him call us back if no improvement or see an urgent care there if it gets worse

## 2013-07-26 NOTE — Telephone Encounter (Signed)
Please advise?  Fax # [phone]

## 2013-07-26 NOTE — Telephone Encounter (Addendum)
Spoke with pt, he has gotten his insurance issues taken care of and would like to reschedule his TEE to 08-30-13 with dr Jens Som. Will make arrangements and call the pt back.

## 2013-07-26 NOTE — Telephone Encounter (Signed)
pts spouse has also left 2 messages about this also stating that pt is having breathing problems with the Atenolol that Dr Jens Som RX'd? Pt left his inhaler on the table at home?  Please advise refill?

## 2013-07-30 ENCOUNTER — Ambulatory Visit (HOSPITAL_COMMUNITY): Admit: 2013-07-30 | Payer: Self-pay | Admitting: Cardiology

## 2013-07-30 ENCOUNTER — Encounter (HOSPITAL_COMMUNITY): Payer: Self-pay

## 2013-07-30 SURGERY — ECHOCARDIOGRAM, TRANSESOPHAGEAL
Anesthesia: Moderate Sedation

## 2013-08-02 ENCOUNTER — Telehealth: Payer: Self-pay | Admitting: Cardiology

## 2013-08-02 MED ORDER — DILTIAZEM HCL ER COATED BEADS 180 MG PO CP24: 180.0000 mg | ORAL_CAPSULE | Freq: Every day | ORAL | Status: DC

## 2013-08-02 NOTE — Telephone Encounter (Signed)
Spoke with pt, Aware of dr crenshaw's recommendations.  °

## 2013-08-02 NOTE — Telephone Encounter (Signed)
New Prob  Pt believes he is having a side effect to one of his medications. He said that he has been using his rescue inhaler more and he is waking up in the middle of the night coughing.

## 2013-08-02 NOTE — Telephone Encounter (Signed)
Dc atenolol and increase cardizem to 180 mg daily Olga Millers

## 2013-08-02 NOTE — Telephone Encounter (Signed)
Spoke with pt, he has noticed since starting the atenolol he has had to use his emergency inhaler and has had more coughing. He has stopped the atenolol for the last four days and these symptoms have improved. He just wanted to know if we wanted him to take anything in its place. Will forward for dr Jens Som review

## 2013-08-09 ENCOUNTER — Ambulatory Visit: Payer: BC Managed Care – PPO | Admitting: Family Medicine

## 2013-08-13 ENCOUNTER — Encounter: Payer: Self-pay | Admitting: Physician Assistant

## 2013-08-13 ENCOUNTER — Ambulatory Visit (INDEPENDENT_AMBULATORY_CARE_PROVIDER_SITE_OTHER): Payer: BC Managed Care – PPO | Admitting: Physician Assistant

## 2013-08-13 VITALS — BP 142/84 | HR 84 | Temp 98.1°F | Ht 75.0 in | Wt 229.0 lb

## 2013-08-13 DIAGNOSIS — M7700 Medial epicondylitis, unspecified elbow: Secondary | ICD-10-CM

## 2013-08-13 DIAGNOSIS — J42 Unspecified chronic bronchitis: Secondary | ICD-10-CM

## 2013-08-13 DIAGNOSIS — I10 Essential (primary) hypertension: Secondary | ICD-10-CM

## 2013-08-13 DIAGNOSIS — M7701 Medial epicondylitis, right elbow: Secondary | ICD-10-CM

## 2013-08-13 DIAGNOSIS — I4891 Unspecified atrial fibrillation: Secondary | ICD-10-CM

## 2013-08-13 DIAGNOSIS — J40 Bronchitis, not specified as acute or chronic: Secondary | ICD-10-CM

## 2013-08-13 DIAGNOSIS — J449 Chronic obstructive pulmonary disease, unspecified: Secondary | ICD-10-CM | POA: Insufficient documentation

## 2013-08-13 DIAGNOSIS — G8929 Other chronic pain: Secondary | ICD-10-CM

## 2013-08-13 MED ORDER — FLUTICASONE-SALMETEROL 100-50 MCG/DOSE IN AEPB: 1.0000 | INHALATION_SPRAY | Freq: Two times a day (BID) | RESPIRATORY_TRACT | Status: DC

## 2013-08-13 MED ORDER — HYDROCODONE-ACETAMINOPHEN 5-325 MG PO TABS: 1.0000 | ORAL_TABLET | Freq: Three times a day (TID) | ORAL | Status: DC | PRN

## 2013-08-13 MED ORDER — PREDNISONE 10 MG PO TABS: ORAL_TABLET | ORAL | Status: DC

## 2013-08-13 MED ORDER — ALBUTEROL SULFATE HFA 108 (90 BASE) MCG/ACT IN AERS: 2.0000 | INHALATION_SPRAY | RESPIRATORY_TRACT | Status: DC | PRN

## 2013-08-13 MED ORDER — IPRATROPIUM-ALBUTEROL 0.5-2.5 (3) MG/3ML IN SOLN
3.0000 mL | Freq: Once | RESPIRATORY_TRACT | Status: AC
  Administered 2013-08-13: 3 mL via RESPIRATORY_TRACT

## 2013-08-13 NOTE — Assessment & Plan Note (Addendum)
Duoneb given in office x 1.  Refill Albuterol rescue inhaler.  Rx for Advair.  Prednisone taper given. Order PFTs.

## 2013-08-13 NOTE — Assessment & Plan Note (Addendum)
Refill Hydrocodone #60.

## 2013-08-13 NOTE — Assessment & Plan Note (Signed)
Trial of losartan 100mg , due to BP 160s/100s in office.  Follow-up with Abner Greenspan next Wed for previously scheduled appointment.

## 2013-08-13 NOTE — Patient Instructions (Addendum)
Please continue using your rescue inhaler as needed for symptoms.  Take Advair -- 1 puff in lungs two times a day.  Prescription sent to pharmacy.  Can use sample first.  We will call you regarding your pulmonary testing.  Chronic Obstructive Pulmonary Disease Chronic obstructive pulmonary disease (COPD) is a condition in which airflow from the lungs is restricted. The lungs can never return to normal, but there are measures you can take which will improve them and make you feel better. CAUSES   Smoking.  Exposure to secondhand smoke.  Breathing in irritants such as air pollution, dust, cigarette smoke, strong odors, aerosol sprays, or paint fumes.  History of lung infections. SYMPTOMS   Deep, persistent (chronic) cough with a large amount of thick mucus.  Wheezing.  Shortness of breath, especially with physical activity.  Feeling like you cannot get enough air.  Difficulty breathing.  Rapid breaths (tachypnea).  Gray or bluish discoloration (cyanosis) of the skin, especially in fingers, toes, or lips.  Fatigue.  Weight loss.  Swelling in legs, ankles, or feet.  Fast heartbeat (tachycardia).  Frequent lung infections.   Chest tightness. DIAGNOSIS  Initial diagnosis may be based on your history, symptoms, and physical examination. Additional tests for COPD may include:  Chest X-ray.  Computed tomography (CT) scan.  Lung (pulmonary) function tests.  Blood tests. TREATMENT  Treatment focuses on making you comfortable (supportive care). Your caregiver may prescribe medicines (inhaled or pills) to help improve your breathing. Additional treatment options may include oxygen therapy and pulmonary rehabilitation. Treatment should also include reducing your exposure to known irritants and following a plan to stop smoking. HOME CARE INSTRUCTIONS   Take all medicines, including antibiotic medicines, as directed by your caregiver.  Use inhaled medicines as directed by your  caregiver.  Avoid medicines or cough syrups that dry up your airway (antihistamines) and slow down the elimination of secretions. This decreases respiratory capacity and may lead to infections.  If you smoke, stop smoking.  Avoid exposure to smoke, chemicals, and fumes that aggravate your breathing.  Avoid contact with individuals that have a contagious illness.  Avoid extreme temperature and humidity changes.  Use humidifiers at home and at your bedside if they do not make breathing difficult.  Drink enough water and fluids to keep your urine clear or pale yellow. This loosens secretions.  Eat healthy foods. Eating smaller, more frequent meals and resting before meals may help you maintain your strength.  Ask your caregiver about the use of vitamins and mineral supplements.  Stay active. Exercise and physical activity will help maintain your ability to do things you want to do.  Balance activity with periods of rest.  Assume a position of comfort if you become short of breath.  Learn and use relaxation techniques.  Learn and use controlled breathing techniques as directed by your caregiver. Controlled breathing techniques include:  Pursed lip breathing. This breathing technique starts with breathing in (inhaling) through your nose for 1 second. Next, purse your lips as if you were going to whistle. Then breathe out (exhale) through the pursed lips for 2 seconds.  Diaphragmatic breathing. Start by putting one hand on your abdomen just above your waist. Inhale slowly through your nose. The hand on your abdomen should move out. Then exhale slowly through pursed lips. You should be able to feel the hand on your abdomen moving in as you exhale.  Learn and use controlled coughing to clear mucus from your lungs. Controlled coughing is a  series of short, progressive coughs. The steps of controlled coughing are: 1. Lean your head slightly forward. 2. Breathe in deeply using diaphragmatic  breathing. 3. Try to hold your breath for 3 seconds. 4. Keep your mouth slightly open while coughing twice. 5. Spit any mucus out into a tissue. 6. Rest and repeat the steps once or twice as needed.  Receive all protective vaccines your caregiver suggests, especially pneumococcal and influenza vaccines.  Learn to manage stress.  Schedule and attend all follow-up appointments as directed by your caregiver. It is important to keep all your appointments.  Participate in pulmonary rehabilitation as directed by your caregiver.  Use home oxygen as suggested. SEEK MEDICAL CARE IF:   You are coughing up more mucus than usual.  There is a change in the color or thickness of the mucus.  Breathing is more labored than usual.  Your breathing is faster than usual.  Your skin color is more cyanotic than usual.  You are running out of the medicine you take for your breathing.  You are anxious, apprehensive, or restless.  You have a fever. SEEK IMMEDIATE MEDICAL CARE IF:   You have a rapid heart rate.  You have shortness of breath while you are resting.  You have shortness of breath that prevents you from being able to talk.  You have shortness of breath that prevents you from performing your usual physical activities.  You have chest pain lasting longer than 5 minutes.  You have a seizure.  Your family or friends notice that you are agitated or confused. MAKE SURE YOU:   Understand these instructions.  Will watch your condition.  Will get help right away if you are not doing well or get worse. Document Released: 09/18/2005 Document Revised: 09/02/2012 Document Reviewed: 02/08/2011 [facility] Patient Information 2014 [place], [place].

## 2013-08-13 NOTE — Progress Notes (Signed)
Patient ID: Marc Hansen, male   DOB: [birth year], 52 y.o.   MRN: [id number]  Patient presents to clinic today c/o increased need for inhaler.  Information was obtained from the patient.  Patient states that he normally uses albuterol rescue inhaler once or twice a week.  Over the past few weeks he has noticed he is using rescue inhaler 3-4 x week.  Normally uses inhaler for exertional dyspnea.  Patient has significant smoking history and history of HTN, A. Fib and recent CVA.  Has been seen in this clinic for wheezing and dyspnea in the past.  Recommended/scheduled PFTs, but patient has never undergone study.  Patient sees cardiology and is scheduled for TTE in 08/2013.  Patient endorses chronic cough, productive of yellow phlegm.  Denies fevers, chills, chest pain, dizziness or syncope.  Denies change in mentation or neurological deficit.  Takes cozaar, cardizem and pradaxa.    Patient also requesting refill for hydrocodone for chronic R arm pain and L thumb pain.  Patient sees orthopedist who recommends surgery or R elbow in the near future.  Past Medical History  Diagnosis Date  . Allergy     seasonal  . Hypertension   . Overweight(278.02) 04/17/2011  . Arthritis 04/17/2011  . GERD (gastroesophageal reflux disease) 04/17/2011  . Vitamin D deficiency 04/17/2011  . Hand crush injury 07/05/2011  . Umbilical hernia 07/05/2011  . Hyperlipidemia 07/16/2011  . Atrial fibrillation 06/05/13    New dx in hosp for w/u for CVA--pradaxa started  . Abnormal liver function 08/20/2012  . Medial epicondylitis of right elbow 11/09/2012  . CVA (cerebral infarction) 06/05/13    A-fib new dx.  Carotid dopplers neg bilat.  MRI brain: moderate sized partially hemorrhagic left parietal lobe infarct.  Diffuse intracranial atherosclerotic changes noted on MRA brain.    Current Outpatient Prescriptions on File Prior to Visit  Medication Sig Dispense Refill  . albuterol (PROVENTIL) (2.5 MG/3ML) 0.083% nebulizer solution  Take 3 mLs (2.5 mg total) by nebulization every 6 (six) hours as needed.  100 vial  2  . b complex vitamins tablet Take 1 tablet by mouth daily.      . cholecalciferol (VITAMIN D) 1000 UNITS tablet Take 1,000 Units by mouth daily.      . dabigatran (PRADAXA) 150 MG CAPS Take 1 capsule (150 mg total) by mouth every 12 (twelve) hours.  60 capsule  2  . diltiazem (CARDIZEM CD) 180 MG 24 hr capsule Take 1 capsule (180 mg total) by mouth daily.  30 capsule  12  . fish oil-omega-3 fatty acids 1000 MG capsule Take 2 capsules (2 g total) by mouth 2 (two) times daily.      . folic acid (FOLVITE) 1 MG tablet Take 1 tablet (1 mg total) by mouth daily.  30 tablet  0  . losartan (COZAAR) 50 MG tablet Take 1 tablet (50 mg total) by mouth daily.  90 tablet  2  . MILK THISTLE PO Take 1 tablet by mouth daily.      . multivitamin (THERAGRAN) per tablet Take 1 tablet by mouth daily.        . nitroGLYCERIN (NITROSTAT) 0.4 MG SL tablet Place 1 tablet (0.4 mg total) under the tongue every 5 (five) minutes as needed for chest pain.  25 tablet  1  . omeprazole (PRILOSEC) 40 MG capsule Take 40 mg by mouth every 3 (three) days.      . pravastatin (PRAVACHOL) 40 MG tablet Take 1 tablet (40 mg  total) by mouth every evening.  90 tablet  3  . thiamine 100 MG tablet Take 1 tablet (100 mg total) by mouth daily.  30 tablet  0  . traMADol (ULTRAM) 50 MG tablet 1-2 tabs po tid prn pain  60 tablet  2  . [DISCONTINUED] esomeprazole (NEXIUM) 40 MG capsule Take 1 capsule (40 mg total) by mouth daily.  30 capsule  5  . [DISCONTINUED] Fluticasone Propionate, Inhal, (FLOVENT DISKUS) 100 MCG/BLIST AEPB Prn   60 each  1  . [DISCONTINUED] loratadine (CLARITIN) 10 MG tablet Take 1 tablet (10 mg total) by mouth daily.  30 tablet  2   No current facility-administered medications on file prior to visit.    No Known Allergies  Family History  Problem Relation Age of Onset  . Cancer Mother 36    breast- remission  . Fibromyalgia Mother    . Other Father     CHF- stent   . Heart attack Father   . Heart disease Maternal Uncle   . Heart disease Maternal Grandmother   . Heart disease Maternal Grandfather     History   Social History  . Marital Status: Married    Spouse Name: N/A    Number of Children: 3  . Years of Education: N/A   Occupational History  .     Social History Main Topics  . Smoking status: Current Some Day Smoker -- 0.30 packs/day    Types: Cigarettes  . Smokeless tobacco: Never Used  . Alcohol Use: Yes     Comment: 24 beers weekly  . Drug Use: No  . Sexual Activity: Yes    Partners: Female   Other Topics Concern  . None   Social History Narrative  . None    Review of Systems  Constitutional: Negative for fever, chills, weight loss and malaise/fatigue.  Eyes: Negative for blurred vision.  Respiratory: Positive for cough, sputum production, shortness of breath and wheezing. Negative for hemoptysis.   Cardiovascular: Negative for chest pain, orthopnea, leg swelling and PND.       Chronic a. fib  Gastrointestinal: Negative for nausea and vomiting.  Musculoskeletal: Positive for joint pain.  Neurological: Negative for dizziness, tingling, sensory change, loss of consciousness and headaches.   Filed Vitals:   08/13/13 0810  BP: 142/84  Pulse: 84  Temp: 98.1 F (36.7 C)   Physical Exam  Vitals reviewed. Constitutional: He is oriented to person, place, and time and well-developed, well-nourished, and in no distress.  HENT:  Head: Normocephalic.  Eyes: Pupils are equal, round, and reactive to light.  Neck: Normal range of motion. Neck supple.  Cardiovascular: Normal heart sounds and intact distal pulses.   No murmur heard. Irregularly irregular rhythm  Pulmonary/Chest: Effort normal. No respiratory distress. He has wheezes. He has no rales. He exhibits no tenderness.  Lymphadenopathy:    He has no cervical adenopathy.  Neurological: He is alert and oriented to person, place, and  time. No cranial nerve deficit.  Skin: Skin is warm and dry. No rash noted.   Recent Results (from the past 2160 hour(s))  ETHANOL     Status: Abnormal   Collection Time    06/05/13 10:01 PM      Result Value Range   Alcohol, Ethyl (B) 20 (*) 0 - 11 mg/dL   Comment:            LOWEST DETECTABLE LIMIT FOR     SERUM ALCOHOL IS 11 mg/dL  FOR MEDICAL PURPOSES ONLY  PROTIME-INR     Status: None   Collection Time    06/05/13 10:01 PM      Result Value Range   Prothrombin Time 13.5  11.6 - 15.2 seconds   INR 1.04  0.00 - 1.49  APTT     Status: None   Collection Time    06/05/13 10:01 PM      Result Value Range   aPTT 29  24 - 37 seconds  CBC     Status: Abnormal   Collection Time    06/05/13 10:01 PM      Result Value Range   WBC 10.7 (*) 4.0 - 10.5 K/uL   RBC 4.70  4.22 - 5.81 MIL/uL   Hemoglobin 14.7  13.0 - 17.0 g/dL   HCT 16.1  09.6 - 04.5 %   MCV 87.2  78.0 - 100.0 fL   MCH 31.3  26.0 - 34.0 pg   MCHC 35.9  30.0 - 36.0 g/dL   RDW 40.9  81.1 - 91.4 %   Platelets 220  150 - 400 K/uL  DIFFERENTIAL     Status: Abnormal   Collection Time    06/05/13 10:01 PM      Result Value Range   Neutrophils Relative % 51  43 - 77 %   Neutro Abs 5.4  1.7 - 7.7 K/uL   Lymphocytes Relative 38  12 - 46 %   Lymphs Abs 4.1 (*) 0.7 - 4.0 K/uL   Monocytes Relative 8  3 - 12 %   Monocytes Absolute 0.8  0.1 - 1.0 K/uL   Eosinophils Relative 3  0 - 5 %   Eosinophils Absolute 0.3  0.0 - 0.7 K/uL   Basophils Relative 1  0 - 1 %   Basophils Absolute 0.1  0.0 - 0.1 K/uL  COMPREHENSIVE METABOLIC PANEL     Status: Abnormal   Collection Time    06/05/13 10:01 PM      Result Value Range   Sodium 138  135 - 145 mEq/L   Potassium 4.2  3.5 - 5.1 mEq/L   Chloride 103  96 - 112 mEq/L   CO2 22  19 - 32 mEq/L   Glucose, Bld 89  70 - 99 mg/dL   BUN 25 (*) 6 - 23 mg/dL   Creatinine, Ser 7.82  0.50 - 1.35 mg/dL   Calcium 9.5  8.4 - 95.6 mg/dL   Total Protein 7.4  6.0 - 8.3 g/dL   Albumin 4.5  3.5  - 5.2 g/dL   AST 76 (*) 0 - 37 U/L   ALT 108 (*) 0 - 53 U/L   Alkaline Phosphatase 73  39 - 117 U/L   Total Bilirubin 0.5  0.3 - 1.2 mg/dL   GFR calc non Af Amer 75 (*) >90 mL/min   GFR calc Af Amer 87 (*) >90 mL/min   Comment:            The eGFR has been calculated     using the CKD EPI equation.     This calculation has not been     validated in all clinical     situations.     eGFR's persistently     <90 mL/min signify     possible Chronic Kidney Disease.  TROPONIN I     Status: None   Collection Time    06/05/13 10:01 PM      Result Value Range   Troponin I <  0.30  <0.30 ng/mL   Comment:            Due to the release kinetics of cTnI,     a negative result within the first hours     of the onset of symptoms does not rule out     myocardial infarction with certainty.     If myocardial infarction is still suspected,     repeat the test at appropriate intervals.  POCT I-STAT TROPONIN I     Status: None   Collection Time    06/05/13 10:14 PM      Result Value Range   Troponin i, poc 0.01  0.00 - 0.08 ng/mL   Comment 3            Comment: Due to the release kinetics of cTnI,     a negative result within the first hours     of the onset of symptoms does not rule out     myocardial infarction with certainty.     If myocardial infarction is still suspected,     repeat the test at appropriate intervals.  POCT I-STAT, CHEM 8     Status: Abnormal   Collection Time    06/05/13 10:16 PM      Result Value Range   Sodium 139  135 - 145 mEq/L   Potassium 4.3  3.5 - 5.1 mEq/L   Chloride 107  96 - 112 mEq/L   BUN 25 (*) 6 - 23 mg/dL   Creatinine, Ser 9.60  0.50 - 1.35 mg/dL   Glucose, Bld 92  70 - 99 mg/dL   Calcium, Ion 4.54  0.98 - 1.23 mmol/L   TCO2 23  0 - 100 mmol/L   Hemoglobin 15.3  13.0 - 17.0 g/dL   HCT 11.9  14.7 - 82.9 %  URINE RAPID DRUG SCREEN (HOSP PERFORMED)     Status: Abnormal   Collection Time    06/05/13 10:27 PM      Result Value Range   Opiates  POSITIVE (*) NONE DETECTED   Cocaine NONE DETECTED  NONE DETECTED   Benzodiazepines NONE DETECTED  NONE DETECTED   Amphetamines NONE DETECTED  NONE DETECTED   Tetrahydrocannabinol POSITIVE (*) NONE DETECTED   Barbiturates NONE DETECTED  NONE DETECTED   Comment:            DRUG SCREEN FOR MEDICAL PURPOSES     ONLY.  IF CONFIRMATION IS NEEDED     FOR ANY PURPOSE, NOTIFY LAB     WITHIN 5 DAYS.                LOWEST DETECTABLE LIMITS     FOR URINE DRUG SCREEN     Drug Class       Cutoff (ng/mL)     Amphetamine      1000     Barbiturate      200     Benzodiazepine   200     Tricyclics       300     Opiates          300     Cocaine          300     THC              50  URINALYSIS, ROUTINE W REFLEX MICROSCOPIC     Status: None   Collection Time    06/05/13 10:27 PM      Result Value Range  Color, Urine YELLOW  YELLOW   APPearance CLEAR  CLEAR   Specific Gravity, Urine 1.025  1.005 - 1.030   pH 5.0  5.0 - 8.0   Glucose, UA NEGATIVE  NEGATIVE mg/dL   Hgb urine dipstick NEGATIVE  NEGATIVE   Bilirubin Urine NEGATIVE  NEGATIVE   Ketones, ur NEGATIVE  NEGATIVE mg/dL   Protein, ur NEGATIVE  NEGATIVE mg/dL   Urobilinogen, UA 0.2  0.0 - 1.0 mg/dL   Nitrite NEGATIVE  NEGATIVE   Leukocytes, UA NEGATIVE  NEGATIVE   Comment: MICROSCOPIC NOT DONE ON URINES WITH NEGATIVE PROTEIN, BLOOD, LEUKOCYTES, NITRITE, OR GLUCOSE <1000 mg/dL.  HEMOGLOBIN A1C     Status: Abnormal   Collection Time    06/06/13  4:25 AM      Result Value Range   Hemoglobin A1C 5.8 (*) <5.7 %   Comment: (NOTE)                                                                               According to the ADA Clinical Practice Recommendations for 2011, when     HbA1c is used as a screening test:      >=6.5%   Diagnostic of Diabetes Mellitus               (if abnormal result is confirmed)     5.7-6.4%   Increased risk of developing Diabetes Mellitus     References:Diagnosis and Classification of Diabetes Mellitus,Diabetes      Care,2011,34(Suppl 1):S62-S69 and Standards of Medical Care in             Diabetes - 2011,Diabetes Care,2011,34 (Suppl 1):S11-S61.   Mean Plasma Glucose 120 (*) <117 mg/dL  LIPID PANEL     Status: Abnormal   Collection Time    06/06/13  4:25 AM      Result Value Range   Cholesterol 187  0 - 200 mg/dL   Triglycerides 161  <096 mg/dL   HDL 31 (*) >04 mg/dL   Total CHOL/HDL Ratio 6.0     VLDL 24  0 - 40 mg/dL   LDL Cholesterol 540 (*) 0 - 99 mg/dL   Comment:            Total Cholesterol/HDL:CHD Risk     Coronary Heart Disease Risk Table                         Men   Women      1/2 Average Risk   3.4   3.3      Average Risk       5.0   4.4      2 X Average Risk   9.6   7.1      3 X Average Risk  23.4   11.0                Use the calculated Patient Ratio     above and the CHD Risk Table     to determine the patient's CHD Risk.                ATP III CLASSIFICATION (LDL):      <100  mg/dL   Optimal      401-027  mg/dL   Near or Above                        Optimal      130-159  mg/dL   Borderline      253-664  mg/dL   High      >403     mg/dL   Very High  CBC     Status: None   Collection Time    06/08/13  4:35 AM      Result Value Range   WBC 9.1  4.0 - 10.5 K/uL   RBC 4.87  4.22 - 5.81 MIL/uL   Hemoglobin 14.7  13.0 - 17.0 g/dL   HCT 47.4  25.9 - 56.3 %   MCV 87.3  78.0 - 100.0 fL   MCH 30.2  26.0 - 34.0 pg   MCHC 34.6  30.0 - 36.0 g/dL   RDW 87.5  64.3 - 32.9 %   Platelets 235  150 - 400 K/uL  BASIC METABOLIC PANEL     Status: Abnormal   Collection Time    06/08/13  4:35 AM      Result Value Range   Sodium 138  135 - 145 mEq/L   Potassium 4.3  3.5 - 5.1 mEq/L   Chloride 102  96 - 112 mEq/L   CO2 28  19 - 32 mEq/L   Glucose, Bld 102 (*) 70 - 99 mg/dL   BUN 18  6 - 23 mg/dL   Creatinine, Ser 5.18  0.50 - 1.35 mg/dL   Calcium 9.6  8.4 - 84.1 mg/dL   GFR calc non Af Amer 74 (*) >90 mL/min   GFR calc Af Amer 86 (*) >90 mL/min   Comment:            The  eGFR has been calculated     using the CKD EPI equation.     This calculation has not been     validated in all clinical     situations.     eGFR's persistently     <90 mL/min signify     possible Chronic Kidney Disease.   Interventions:  Duoneb x 1 given in office.  Repeat lung exam shows increase in air movement with decrease in wheezing.   EKG -- Atrial Fib with rate of 70. -- performed 2/2 repeat blood check with elevated BP at 160s/100s  Assessment/Plan: HTN (hypertension) Trial of losartan 100mg , due to BP 160s/100s in office.  Follow-up with Abner Greenspan next Wed for previously scheduled appointment.  Medial epicondylitis of right elbow Refill Hydrocodone #60.  Chronic bronchitis Duoneb given in office x 1.  Refill Albuterol rescue inhaler.  Rx for Advair.  Prednisone taper given. Order PFTs.

## 2013-08-18 ENCOUNTER — Ambulatory Visit: Payer: BC Managed Care – PPO | Admitting: Family Medicine

## 2013-08-30 ENCOUNTER — Encounter (HOSPITAL_COMMUNITY)
Admission: RE | Disposition: A | Discharge status: Home / Self Care | Payer: BC Managed Care – PPO | Source: Ambulatory Visit | Attending: Cardiology

## 2013-08-30 ENCOUNTER — Encounter (HOSPITAL_COMMUNITY): Payer: Self-pay | Admitting: Gastroenterology

## 2013-08-30 ENCOUNTER — Other Ambulatory Visit (HOSPITAL_COMMUNITY): Payer: BC Managed Care – PPO

## 2013-08-30 ENCOUNTER — Ambulatory Visit (HOSPITAL_COMMUNITY)
Admission: RE | Admit: 2013-08-30 | Discharge: 2013-08-30 | Disposition: A | Discharge status: Home / Self Care | Payer: BC Managed Care – PPO | Source: Ambulatory Visit | Attending: Cardiology | Admitting: Cardiology

## 2013-08-30 DIAGNOSIS — Z7902 Long term (current) use of antithrombotics/antiplatelets: Secondary | ICD-10-CM | POA: Insufficient documentation

## 2013-08-30 DIAGNOSIS — E785 Hyperlipidemia, unspecified: Secondary | ICD-10-CM | POA: Insufficient documentation

## 2013-08-30 DIAGNOSIS — E663 Overweight: Secondary | ICD-10-CM | POA: Insufficient documentation

## 2013-08-30 DIAGNOSIS — Z79899 Other long term (current) drug therapy: Secondary | ICD-10-CM | POA: Insufficient documentation

## 2013-08-30 DIAGNOSIS — I10 Essential (primary) hypertension: Secondary | ICD-10-CM | POA: Insufficient documentation

## 2013-08-30 DIAGNOSIS — I4891 Unspecified atrial fibrillation: Secondary | ICD-10-CM | POA: Insufficient documentation

## 2013-08-30 DIAGNOSIS — Z8673 Personal history of transient ischemic attack (TIA), and cerebral infarction without residual deficits: Secondary | ICD-10-CM | POA: Insufficient documentation

## 2013-08-30 DIAGNOSIS — K219 Gastro-esophageal reflux disease without esophagitis: Secondary | ICD-10-CM | POA: Insufficient documentation

## 2013-08-30 DIAGNOSIS — Z8249 Family history of ischemic heart disease and other diseases of the circulatory system: Secondary | ICD-10-CM | POA: Insufficient documentation

## 2013-08-30 DIAGNOSIS — E559 Vitamin D deficiency, unspecified: Secondary | ICD-10-CM | POA: Insufficient documentation

## 2013-08-30 DIAGNOSIS — I059 Rheumatic mitral valve disease, unspecified: Secondary | ICD-10-CM

## 2013-08-30 DIAGNOSIS — F17200 Nicotine dependence, unspecified, uncomplicated: Secondary | ICD-10-CM | POA: Insufficient documentation

## 2013-08-30 DIAGNOSIS — I079 Rheumatic tricuspid valve disease, unspecified: Secondary | ICD-10-CM | POA: Insufficient documentation

## 2013-08-30 DIAGNOSIS — I330 Acute and subacute infective endocarditis: Secondary | ICD-10-CM | POA: Insufficient documentation

## 2013-08-30 HISTORY — PX: TEE WITHOUT CARDIOVERSION: SHX5443

## 2013-08-30 SURGERY — ECHOCARDIOGRAM, TRANSESOPHAGEAL
Anesthesia: Moderate Sedation

## 2013-08-30 MED ORDER — FENTANYL CITRATE 0.05 MG/ML IJ SOLN
INTRAMUSCULAR | Status: DC | PRN
  Administered 2013-08-30 (×4): 25 ug via INTRAVENOUS

## 2013-08-30 MED ORDER — SODIUM CHLORIDE 0.9 % IV SOLN
INTRAVENOUS | Status: DC
  Administered 2013-08-30: 500 mL via INTRAVENOUS

## 2013-08-30 MED ORDER — MIDAZOLAM HCL 10 MG/2ML IJ SOLN
INTRAMUSCULAR | Status: DC | PRN
  Administered 2013-08-30 (×5): 2 mg via INTRAVENOUS

## 2013-08-30 NOTE — H&P (Signed)
TARVARES LANT  06/16/2013 10:00 AM   Office Visit  MRN:  [id number]   Description: 52 year old male  Provider: Lewayne Bunting, MD  Department: [facility]        Referring Provider    Jeoffrey Massed, MD      Diagnoses    Atrial fibrillation    -  Primary    427.31    Mitral valve disease        394.9    Atrial fibrillation        427.31    Mitral regurgitation        424.0    Tobacco abuse, in remission        V15.82    HTN (hypertension)        401.9    Hyperlipidemia        272.4      Reason for Visit    Atrial Fibrillation    New patient evaluation         Progress Notes    Lewayne Bunting, MD at 06/16/2013 11:00 AM    Status: Signed                    HPI: 52 year old male for evaluation of atrial fibrillation. Patient admitted to [facility] in June of 2014 with a CVA. Echocardiogram in June 2014 showed an ejection fraction of 45-50%, moderate left atrial enlargement and moderate to severe mitral regurgitation. Carotid Dopplers in June 2014 showed less than 40% stenosis on the left and 0-39% stenosis on the right. 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 TSH 2.08. Since discharge, he denies dyspnea, palpitations or syncope. No bleeding. He has an occasional brief pain in the left chest area not related to exertion. It lasts less than 1 minute and resolves spontaneously.    Current Outpatient Prescriptions   Medication  Sig  Dispense  Refill   .  albuterol (PROVENTIL HFA;VENTOLIN HFA) 108 (90 BASE) MCG/ACT inhaler  Inhale 2 puffs into the lungs as needed.   1 Inhaler   11   .  albuterol (PROVENTIL) (2.5 MG/3ML) 0.083% nebulizer solution  Take 3 mLs (2.5 mg total) by nebulization every 6 (six) hours as needed.   100 vial   2   .  b complex vitamins tablet  Take 1 tablet by mouth daily.         .  cholecalciferol (VITAMIN D) 1000 UNITS tablet  Take 1,000 Units by mouth daily.         .  dabigatran  (PRADAXA) 150 MG CAPS  Take 1 capsule (150 mg total) by mouth every 12 (twelve) hours.   60 capsule   0   .  diltiazem (CARDIZEM CD) 120 MG 24 hr capsule  Take 1 capsule (120 mg total) by mouth daily.   30 capsule   0   .  fish oil-omega-3 fatty acids 1000 MG capsule  Take 2 capsules (2 g total) by mouth 2 (two) times daily.         .  folic acid (FOLVITE) 1 MG tablet  Take 1 tablet (1 mg total) by mouth daily.   30 tablet   0   .  HYDROcodone-acetaminophen (NORCO/VICODIN) 5-325 MG per tablet  Take 1 tablet by mouth every 8 (eight) hours as needed for pain.   60 tablet   1   .  losartan (COZAAR) 50 MG tablet  Take 1 tablet (50 mg total) by mouth daily.   90 tablet   2   .  MILK THISTLE PO  Take 1 tablet by mouth daily.         .  multivitamin (THERAGRAN) per tablet  Take 1 tablet by mouth daily.           .  nitroGLYCERIN (NITROSTAT) 0.4 MG SL tablet  Place 1 tablet (0.4 mg total) under the tongue every 5 (five) minutes as needed for chest pain.   25 tablet   1   .  omeprazole (PRILOSEC) 40 MG capsule  Take 40 mg by mouth every 3 (three) days.         Marland Kitchen  thiamine 100 MG tablet  Take 1 tablet (100 mg total) by mouth daily.   30 tablet   0   .  traMADol (ULTRAM) 50 MG tablet  1-2 tabs po tid prn pain   60 tablet   2   .  [DISCONTINUED] esomeprazole (NEXIUM) 40 MG capsule  Take 1 capsule (40 mg total) by mouth daily.   30 capsule   5   .  [DISCONTINUED] Fluticasone Propionate, Inhal, (FLOVENT DISKUS) 100 MCG/BLIST AEPB  Prn    60 each   1   .  [DISCONTINUED] loratadine (CLARITIN) 10 MG tablet  Take 1 tablet (10 mg total) by mouth daily.   30 tablet   2       No current facility-administered medications for this visit.        No Known Allergies  Past Medical History   Diagnosis  Date   .  Allergy         seasonal   .  Hypertension     .  Overweight(278.02)  04/17/2011   .  Arthritis  04/17/2011   .  GERD (gastroesophageal reflux disease)  04/17/2011   .  Vitamin D deficiency  04/17/2011    .  Hand crush injury  07/05/2011   .  Umbilical hernia  07/05/2011   .  Hyperlipidemia  07/16/2011   .  Atrial fibrillation  06/05/13       New dx in hosp for w/u for CVA--pradaxa started   .  Abnormal liver function  08/20/2012   .  Medial epicondylitis of right elbow  11/09/2012   .  CVA (cerebral infarction)  06/05/13       A-fib new dx.  Carotid dopplers neg bilat.  MRI brain: moderate sized partially hemorrhagic left parietal lobe infarct.  Diffuse intracranial atherosclerotic changes noted on MRA brain.         Past Surgical History   Procedure  Laterality  Date   .  Vasectomy       .  Carpal tunnel release           right, needed some nerve repair as well   .  Right knee arthroscopy             History       Social History   .  Marital Status:  Married       Spouse Name:  N/A       Number of Children:  3   .  Years of Education:  N/A       Occupational History   .           Social History Main Topics   .  Smoking status:  Current Some Day Smoker -- 0.30 packs/day  Types:  Cigarettes   .  Smokeless tobacco:  Never Used   .  Alcohol Use:  Yes         Comment: 24 beers weekly   .  Drug Use:  No   .  Sexually Active:  Yes -- Male partner(s)       Other Topics  Concern   .  Not on file       Social History Narrative   .  No narrative on file         Family History   Problem  Relation  Age of Onset   .  Cancer  Mother  60       breast- remission   .  Fibromyalgia  Mother     .  Other  Father         CHF- stent    .  Heart attack  Father     .  Heart disease  Maternal Uncle     .  Heart disease  Maternal Grandmother     .  Heart disease  Maternal Grandfather          ROS: no fevers or chills, productive cough, hemoptysis, dysphasia, odynophagia, melena, hematochezia, dysuria, hematuria, rash, seizure activity, orthopnea, PND, pedal edema, claudication. Remaining systems are negative.   Physical Exam:    Blood pressure 140/80, pulse 77,  weight 231 lb (104.781 kg).   General:  Well developed/well nourished in NAD Skin warm/dry Patient not depressed No peripheral clubbing Back-normal HEENT-normal/normal eyelids Neck supple/normal carotid upstroke bilaterally; no bruits; no JVD; no thyromegaly chest - CTA/ normal expansion CV - irregular/normal S1 and S2; no murmurs, rubs or gallops;  PMI nondisplaced Abdomen -NT/ND, no HSM, no mass, + bowel sounds, no bruit 2+ femoral pulses, no bruits Ext-no edema, chords, 2+ DP Neuro-grossly nonfocal   ECG atrial fibrillation at a rate of 77. No ST changes.              Atrial fibrillation - Lewayne Bunting, MD at 06/16/2013 10:38 AM    Status: Written Related Problem: Atrial fibrillation           The patient remains in atrial fibrillation. Continue Cardizem. I reviewed the options of treatment including rate control/anti-coagulation versus rhythm control. The patient is asymptomatic. I will continue rate control and anticoagulation at this point. Check holter monitor to make sure that rate is controlled. Continue pradaxa. Given his moderate to severe mitral regurgitation on echo we may need to discontinue this and instead treat with Coumadin. I would like to review this echocardiogram prior to making that decision. He will need lifelong anticoagulation.         Mitral regurgitation - Lewayne Bunting, MD at 06/21/2013  2:57 PM    Status: Edited Related Problem: Mitral regurgitation           Patient is noted to have moderate to severe mitral regurgitation on recent echocardiogram by report. His LV function is also mildly reduced. His mitral regurgitation may be contributing to left atrial enlargement and be the cause of his atrial fibrillation. Plan to repeat his echocardiogram in approximately 10 weeks. If it appears to be severe we will plan TEE to better assess. He may need to have mitral valve surgery in the future but I would like for him to recover from a CVA.     I  have reviewed the patient's previous transthoracic echocardiogram. His ejection fraction appears to be 40-45%. He has moderate  to severe mitral regurgitation. I will arrange a transesophageal echocardiogram to better assess valve morphology and severity of mitral regurgitation. It may be that his mitral regurgitation caused left atrial enlargement which led to his atrial fibrillation which then led to his CVA. He may require mitral valve surgery but we will make this decision once we perform his transesophageal echocardiogram. He would also need to recover from his recent CVA. Note we performed a Holter monitor and his heart rate is elevated during the day and he is bradycardic in the early a.m. hours. Question if tachycardia is contributing to his cardiomyopathy. Add atenolol 25 mg by mouth q a.m. I will continue his pradaxa for now but will ultimately transition to coumadin given his valvular heart disease. I am hesitant to make a transition now as we are particularly close to his recent presumed embolic CVA.           Revision History       Date/Time User Action    > 06/21/2013  2:57 PM Lewayne Bunting, MD Edit      06/16/2013 10:40 AM Lewayne Bunting, MD Create              Tobacco abuse, in remission - Lewayne Bunting, MD at 06/16/2013 10:40 AM    Status: Written Related Problem: Tobacco abuse, in remission           Patient counseled on discontinuing.         HTN (hypertension) - Lewayne Bunting, MD at 06/16/2013 10:40 AM    Status: Written Related Problem: HTN (hypertension)           Continue present blood pressure medications.         Hyperlipidemia - Lewayne Bunting, MD at 06/16/2013 10:41 AM    Status: Written Related Problem: Hyperlipidemia           Discontinue Lipitor for financial reasons and treat with Pravachol 40 mg daily. Check lipids and liver in 6 weeks.    For TEE; no changes. Olga Millers

## 2013-08-30 NOTE — CV Procedure (Signed)
See full TEE report in [employer].  Brian Crenshaw  

## 2013-08-30 NOTE — Interval H&P Note (Signed)
History and Physical Interval Note:  08/30/2013 12:38 PM  Marc Hansen  has presented today for surgery, with the diagnosis of mr  The various methods of treatment have been discussed with the patient and family. After consideration of risks, benefits and other options for treatment, the patient has consented to  Procedure(s): TRANSESOPHAGEAL ECHOCARDIOGRAM (TEE) (N/A) as a surgical intervention .  The patient's history has been reviewed, patient examined, no change in status, stable for surgery.  I have reviewed the patient's chart and labs.  Questions were answered to the patient's satisfaction.     Olga Millers

## 2013-08-30 NOTE — Progress Notes (Signed)
  Echocardiogram Echocardiogram Transesophageal has been performed.  Marc Hansen 08/30/2013, 1:17 PM

## 2013-08-31 ENCOUNTER — Encounter (HOSPITAL_COMMUNITY): Payer: Self-pay | Admitting: Cardiology

## 2013-09-01 ENCOUNTER — Encounter: Payer: Self-pay | Admitting: Cardiology

## 2013-09-01 ENCOUNTER — Ambulatory Visit (INDEPENDENT_AMBULATORY_CARE_PROVIDER_SITE_OTHER): Payer: BC Managed Care – PPO | Admitting: Cardiology

## 2013-09-01 VITALS — BP 130/86 | HR 86 | Wt 230.0 lb

## 2013-09-01 DIAGNOSIS — I10 Essential (primary) hypertension: Secondary | ICD-10-CM

## 2013-09-01 DIAGNOSIS — I059 Rheumatic mitral valve disease, unspecified: Secondary | ICD-10-CM

## 2013-09-01 DIAGNOSIS — I4891 Unspecified atrial fibrillation: Secondary | ICD-10-CM

## 2013-09-01 DIAGNOSIS — E785 Hyperlipidemia, unspecified: Secondary | ICD-10-CM

## 2013-09-01 DIAGNOSIS — I340 Nonrheumatic mitral (valve) insufficiency: Secondary | ICD-10-CM

## 2013-09-01 NOTE — Assessment & Plan Note (Signed)
Recent transesophageal echocardiogram suggested moderate mitral regurgitation. I will review this with colleagues. Question is whether mitral regurgitation caused left atrial enlargement followed by atrial fibrillation or whether the patient developed atrial fibrillation with tachycardia mediated cardiomyopathy which cause mitral regurgitation. Following review of colleagues we will make decision concerning need for mitral valve surgery. It has now been 3 months since his previous embolic CVA. We will most likely repeat his echocardiogram in 6 months.

## 2013-09-01 NOTE — Assessment & Plan Note (Addendum)
Patient remains in atrial fibrillation. Continue Cardizem for rate control. He has moderate to severe mitral regurgitation. I therefore feel we should not continue pradaxa as he has valvular heart disease. I will arrange for him to be seen in the Coumadin clinic. At that time his pradaxa will be discontinued and he will be treated with Lovenox bridge and Coumadin. Lovenox will be continued until INR is 2 given previous embolic CVA. He also notes some dyspnea on exertion. He is scheduled for pulmonary function tests in approximately one to 2 weeks. There is question whether COPD is contributing to his dyspnea. Note prednisone improved his symptoms previously. If his PFTs are not significantly abnormal we will consider cardioverting to see if sinus rhythm would improve his symptoms. Note his LV function was mild to moderately reduced previously. Question related to tachycardia versus mitral regurgitation. Continue Cardizem for rate control. Atenolol was previously discontinued as it was felt to be contributing to his dyspnea. He is also on an ARB. Will consider ischemia evaluation in the future.

## 2013-09-01 NOTE — Assessment & Plan Note (Signed)
Blood pressure controlled. Continue present medications. 

## 2013-09-01 NOTE — Progress Notes (Signed)
HPI: FU atrial fibrillation. Patient admitted to [facility] in June of 2014 with a CVA. Echocardiogram in June 2014 showed an ejection fraction of 45-50%, moderate left atrial enlargement and moderate to severe mitral regurgitation. Carotid Dopplers in June 2014 showed less than 40% stenosis on the left and 0-39% stenosis on the right. 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 TSH 2.08. Holter monitor in July of 2014 showed atrial fibrillation with elevated heart rates during the day and some bradycardia at night. Atenolol added. Transesophageal echocardiogram in September of 2014 showed an ejection fraction of 40-45%, moderate left atrial enlargement, no thrombus in the atrial appendage or cavity and moderate mitral regurgitation. Since I last saw him, he notes some dyspnea on exertion but no orthopnea, PND, pedal edema, palpitations or syncope. Occasional sharp chest pain for one to 2 seconds but no exertional chest pain.   Current Outpatient Prescriptions  Medication Sig Dispense Refill  . albuterol (PROVENTIL HFA;VENTOLIN HFA) 108 (90 BASE) MCG/ACT inhaler Inhale 2 puffs into the lungs as needed.  1 Inhaler  1  . albuterol (PROVENTIL) (2.5 MG/3ML) 0.083% nebulizer solution Take 3 mLs (2.5 mg total) by nebulization every 6 (six) hours as needed.  100 vial  2  . b complex vitamins tablet Take 1 tablet by mouth daily.      . cholecalciferol (VITAMIN D) 1000 UNITS tablet Take 1,000 Units by mouth daily.      . dabigatran (PRADAXA) 150 MG CAPS Take 1 capsule (150 mg total) by mouth every 12 (twelve) hours.  60 capsule  2  . diltiazem (CARDIZEM CD) 180 MG 24 hr capsule Take 1 capsule (180 mg total) by mouth daily.  30 capsule  12  . fish oil-omega-3 fatty acids 1000 MG capsule Take 2 capsules (2 g total) by mouth 2 (two) times daily.      . Fluticasone-Salmeterol (ADVAIR) 100-50 MCG/DOSE AEPB Inhale 1 puff into the lungs 2 (two) times daily.  1 each  3   . folic acid (FOLVITE) 1 MG tablet Take 1 tablet (1 mg total) by mouth daily.  30 tablet  0  . HYDROcodone-acetaminophen (NORCO/VICODIN) 5-325 MG per tablet Take 1 tablet by mouth every 8 (eight) hours as needed for pain.  60 tablet  1  . losartan (COZAAR) 50 MG tablet Take 1 tablet (50 mg total) by mouth daily.  90 tablet  2  . MILK THISTLE PO Take 1 tablet by mouth daily.      . multivitamin (THERAGRAN) per tablet Take 1 tablet by mouth daily.        Marland Kitchen omeprazole (PRILOSEC) 40 MG capsule Take 40 mg by mouth every 3 (three) days.      . pravastatin (PRAVACHOL) 40 MG tablet Take 1 tablet (40 mg total) by mouth every evening.  90 tablet  3  . thiamine 100 MG tablet Take 1 tablet (100 mg total) by mouth daily.  30 tablet  0  . traMADol (ULTRAM) 50 MG tablet 1-2 tabs po tid prn pain  60 tablet  2  . [DISCONTINUED] esomeprazole (NEXIUM) 40 MG capsule Take 1 capsule (40 mg total) by mouth daily.  30 capsule  5  . [DISCONTINUED] Fluticasone Propionate, Inhal, (FLOVENT DISKUS) 100 MCG/BLIST AEPB Prn   60 each  1  . [DISCONTINUED] loratadine (CLARITIN) 10 MG tablet Take 1 tablet (10 mg total) by mouth daily.  30 tablet  2   No current facility-administered medications for this visit.  Past Medical History  Diagnosis Date  . Allergy     seasonal  . Hypertension   . Overweight(278.02) 04/17/2011  . Arthritis 04/17/2011  . GERD (gastroesophageal reflux disease) 04/17/2011  . Vitamin D deficiency 04/17/2011  . Hand crush injury 07/05/2011  . Umbilical hernia 07/05/2011  . Hyperlipidemia 07/16/2011  . Atrial fibrillation 06/05/13    New dx in hosp for w/u for CVA--pradaxa started  . Abnormal liver function 08/20/2012  . Medial epicondylitis of right elbow 11/09/2012  . CVA (cerebral infarction) 06/05/13    A-fib new dx.  Carotid dopplers neg bilat.  MRI brain: moderate sized partially hemorrhagic left parietal lobe infarct.  Diffuse intracranial atherosclerotic changes noted on MRA brain.  . Mitral  regurgitation     Past Surgical History  Procedure Laterality Date  . Vasectomy    . Carpal tunnel release      right, needed some nerve repair as well  . Right knee arthroscopy    . Tee without cardioversion N/A 08/30/2013    Procedure: TRANSESOPHAGEAL ECHOCARDIOGRAM (TEE);  Surgeon: Lewayne Bunting, MD;  Location: [facility] ENDOSCOPY;  Service: Cardiovascular;  Laterality: N/A;    History   Social History  . Marital Status: Married    Spouse Name: N/A    Number of Children: 3  . Years of Education: N/A   Occupational History  .     Social History Main Topics  . Smoking status: Current Some Day Smoker -- 0.30 packs/day    Types: Cigarettes  . Smokeless tobacco: Never Used  . Alcohol Use: Yes     Comment: 24 beers weekly  . Drug Use: No  . Sexual Activity: Yes    Partners: Female   Other Topics Concern  . Not on file   Social History Narrative  . No narrative on file    ROS: no fevers or chills, productive cough, hemoptysis, dysphasia, odynophagia, melena, hematochezia, dysuria, hematuria, rash, seizure activity, orthopnea, PND, pedal edema, claudication. Remaining systems are negative.  Physical Exam: Well-developed well-nourished in no acute distress.  Skin is warm and dry.  HEENT is normal.  Neck is supple.  Chest is clear to auscultation with normal expansion.  Cardiovascular exam is irregular Abdominal exam nontender or distended. No masses palpated. Extremities show no edema. neuro grossly intact  ECG atrial fibrillation at a rate of 86. No ST changes.

## 2013-09-01 NOTE — Assessment & Plan Note (Signed)
Continue statin. 

## 2013-09-01 NOTE — Patient Instructions (Addendum)
Your physician has recommended you make the following change in your medication:   1.Continue Praxada 2. Will Start Coumadin therapy.  You are scheduled for with the Coumadin Clinic in [place] on 08/06/2013 @ 10:00am.  Your have a schedule a follow-up appointment on 10/06/2013 @ 9:30 am with Dr. Jens Som.   [address], 3rd floor

## 2013-09-02 ENCOUNTER — Other Ambulatory Visit: Payer: Self-pay | Admitting: *Deleted

## 2013-09-02 MED ORDER — DABIGATRAN ETEXILATE MESYLATE 150 MG PO CAPS: 150.0000 mg | ORAL_CAPSULE | Freq: Two times a day (BID) | ORAL | Status: DC

## 2013-09-06 ENCOUNTER — Ambulatory Visit (INDEPENDENT_AMBULATORY_CARE_PROVIDER_SITE_OTHER): Payer: BC Managed Care – PPO | Admitting: Pharmacist

## 2013-09-06 ENCOUNTER — Telehealth: Payer: Self-pay

## 2013-09-06 DIAGNOSIS — Z7901 Long term (current) use of anticoagulants: Secondary | ICD-10-CM | POA: Insufficient documentation

## 2013-09-06 DIAGNOSIS — I4891 Unspecified atrial fibrillation: Secondary | ICD-10-CM

## 2013-09-06 MED ORDER — WARFARIN SODIUM 5 MG PO TABS: 5.0000 mg | ORAL_TABLET | Freq: Every day | ORAL | Status: DC

## 2013-09-06 MED ORDER — ENOXAPARIN SODIUM 150 MG/ML ~~LOC~~ SOLN: 150.0000 mg | Freq: Every day | SUBCUTANEOUS | Status: DC

## 2013-09-06 NOTE — Telephone Encounter (Signed)
Patient left a message stating that he is supposed to come in on the 19th for a breathing test with Pulmonary. Pt wanted to know where appt was and if he could eat?  I asked one of the pulmonary nurses and she stated it was ok if he ate and not to use any albuterol products 3 hours before testing.  I informed patient that the appt is at the [facility] office and not to use any albuterol products 3 hours before testing. Pt voiced understanding

## 2013-09-06 NOTE — Patient Instructions (Signed)
9/15- Lovenox 150mg  once daily AND Coumadin 7.5mg  (1 1/2 tablets) 9/16- Lovenox 150mg  once daily AND Coumadin 7.5mg  (1 1/2 tablets) 9/17- Lovenox 150mg  once daily AND Coumadin 5mg  (1 tablet) 9/18- Lovenox 150mg  once daily AND Coumadin 5mg  (1 tablet) 9/19- Recheck INR

## 2013-09-08 ENCOUNTER — Telehealth: Payer: Self-pay | Admitting: *Deleted

## 2013-09-08 NOTE — Telephone Encounter (Signed)
So I think we have to clarify whom he wants to use as PMD, I think initially he thought this was too far but then realized it was not. He can use Selena Batten, myself or Dr Reece Agar. If he chooses to stay with my practice I am willing to authorize a refill on Tramadol with same strength, same sig, same number but no further refills til seen because I have not seen him in a while

## 2013-09-08 NOTE — Telephone Encounter (Signed)
Faxed refill request received from pharmacy for Tramadol Last filled by MD on 06.19.14, #[id number] [Dr. Arline Asp, EMR stated that pt was changing PCP per [facility]] Last AEX - 11.18.2013 [Seen by [facility] Daphine Deutscher 08.22.14 for Bronchitis], cancel PCP f/u for Medication Refill scheduled 08.27.14 Next AEX - No future appointments scheduled Please Advise/SLS

## 2013-09-09 MED ORDER — TRAMADOL HCL 50 MG PO TABS: ORAL_TABLET | ORAL | Status: DC

## 2013-09-09 NOTE — Telephone Encounter (Signed)
Rx request phoned to pharmacy; pt states that he will schedule f/u appointment w/Dr Abner Greenspan when finished with lung testing w/Pulmonary/SLS

## 2013-09-10 ENCOUNTER — Ambulatory Visit (INDEPENDENT_AMBULATORY_CARE_PROVIDER_SITE_OTHER): Payer: BC Managed Care – PPO | Admitting: Internal Medicine

## 2013-09-10 ENCOUNTER — Ambulatory Visit (INDEPENDENT_AMBULATORY_CARE_PROVIDER_SITE_OTHER): Payer: BC Managed Care – PPO | Admitting: Pharmacist

## 2013-09-10 DIAGNOSIS — I4891 Unspecified atrial fibrillation: Secondary | ICD-10-CM

## 2013-09-10 DIAGNOSIS — J42 Unspecified chronic bronchitis: Secondary | ICD-10-CM

## 2013-09-10 DIAGNOSIS — Z7901 Long term (current) use of anticoagulants: Secondary | ICD-10-CM

## 2013-09-10 LAB — PULMONARY FUNCTION TEST

## 2013-09-10 LAB — POCT INR: INR: 1.3

## 2013-09-10 NOTE — Progress Notes (Signed)
PFT done today. 

## 2013-09-13 ENCOUNTER — Ambulatory Visit (INDEPENDENT_AMBULATORY_CARE_PROVIDER_SITE_OTHER): Payer: BC Managed Care – PPO | Admitting: *Deleted

## 2013-09-13 DIAGNOSIS — I4891 Unspecified atrial fibrillation: Secondary | ICD-10-CM

## 2013-09-13 DIAGNOSIS — Z7901 Long term (current) use of anticoagulants: Secondary | ICD-10-CM

## 2013-09-20 ENCOUNTER — Ambulatory Visit (INDEPENDENT_AMBULATORY_CARE_PROVIDER_SITE_OTHER): Payer: BC Managed Care – PPO | Admitting: *Deleted

## 2013-09-20 DIAGNOSIS — I4891 Unspecified atrial fibrillation: Secondary | ICD-10-CM

## 2013-09-20 DIAGNOSIS — Z7901 Long term (current) use of anticoagulants: Secondary | ICD-10-CM

## 2013-09-27 ENCOUNTER — Ambulatory Visit (INDEPENDENT_AMBULATORY_CARE_PROVIDER_SITE_OTHER): Payer: BC Managed Care – PPO | Admitting: *Deleted

## 2013-09-27 DIAGNOSIS — I4891 Unspecified atrial fibrillation: Secondary | ICD-10-CM

## 2013-09-27 DIAGNOSIS — Z7901 Long term (current) use of anticoagulants: Secondary | ICD-10-CM

## 2013-09-28 ENCOUNTER — Telehealth: Payer: Self-pay

## 2013-09-28 NOTE — Telephone Encounter (Signed)
Pt left a message stating that it has been over 24 hours. Patient would like a callback with results

## 2013-09-29 ENCOUNTER — Telehealth: Payer: Self-pay | Admitting: Family Medicine

## 2013-09-29 NOTE — Telephone Encounter (Signed)
Patient called in requesting the results of his lung test that we referred him to

## 2013-09-29 NOTE — Telephone Encounter (Signed)
I looked and there is a hand result scanned in that shows mild COPD. He should establish with pulmonary.

## 2013-09-29 NOTE — Telephone Encounter (Signed)
Please Advise

## 2013-09-29 NOTE — Telephone Encounter (Signed)
No I did not refer this man for pulmonary function tests. I have not seen him in almost a year in November 2013. He became ill while not my patient and began seeing cardiology and was referred to pulmonology. He appears to keep canceling appts with everyone, I have cannot take responsibility for giving PFT results and what it means without seeing him clinically to assess. He should keep his appt with the pulmonologist so they can interpret these tests for him and help manage them. I certainly can help too but he has to get back in here. I was not sent these results because I did not order the tests and again have not seen him. He has been given appts and je canceled them.

## 2013-09-30 ENCOUNTER — Telehealth: Payer: Self-pay

## 2013-09-30 NOTE — Telephone Encounter (Signed)
Message copied by Court Joy on Thu Sep 30, 2013  8:02 AM ------      Message from: Danise Edge A      Created: Wed Sep 29, 2013 10:29 PM       Malikai Gut please let him know he has mild COPD and he should follow up with pulmonology or come in for further evaluation ------

## 2013-09-30 NOTE — Telephone Encounter (Signed)
Patient informed and voiced understanding. Pt states he will call back to schedule appt

## 2013-10-04 ENCOUNTER — Other Ambulatory Visit: Payer: Self-pay | Admitting: *Deleted

## 2013-10-04 ENCOUNTER — Telehealth: Payer: Self-pay | Admitting: Cardiology

## 2013-10-04 MED ORDER — WARFARIN SODIUM 5 MG PO TABS: 5.0000 mg | ORAL_TABLET | ORAL | Status: DC

## 2013-10-04 NOTE — Telephone Encounter (Signed)
New problem   Pt would like to speak to a nurse concerning his afib and getting it put back into rhythm,

## 2013-10-04 NOTE — Telephone Encounter (Signed)
Spoke with pt, he is interested in the DCCV. Looking through the pts chart, his INR on 09-20-13 was 1.4. Made the pt aware if his INR cont to be above 2.0 for 21 days then we can discuss scheduling the DCCV. Pt agreed with this plan.

## 2013-10-06 ENCOUNTER — Ambulatory Visit: Payer: BC Managed Care – PPO | Admitting: Cardiology

## 2013-10-11 ENCOUNTER — Ambulatory Visit (INDEPENDENT_AMBULATORY_CARE_PROVIDER_SITE_OTHER): Payer: BC Managed Care – PPO | Admitting: *Deleted

## 2013-10-11 ENCOUNTER — Telehealth: Payer: Self-pay | Admitting: Family Medicine

## 2013-10-11 DIAGNOSIS — I4891 Unspecified atrial fibrillation: Secondary | ICD-10-CM

## 2013-10-11 DIAGNOSIS — Z7901 Long term (current) use of anticoagulants: Secondary | ICD-10-CM

## 2013-10-11 DIAGNOSIS — J40 Bronchitis, not specified as acute or chronic: Secondary | ICD-10-CM

## 2013-10-11 MED ORDER — ALBUTEROL SULFATE (2.5 MG/3ML) 0.083% IN NEBU: 2.5000 mg | INHALATION_SOLUTION | Freq: Four times a day (QID) | RESPIRATORY_TRACT | Status: DC | PRN

## 2013-10-11 NOTE — Telephone Encounter (Signed)
Rx sent to pharmacy   

## 2013-10-11 NOTE — Telephone Encounter (Signed)
Refill- albuterol sulfate 0.083% inh sol ml #100. Take by nebulizer every 6 hours as needed. Last fill 8.1.14

## 2013-10-18 ENCOUNTER — Ambulatory Visit (INDEPENDENT_AMBULATORY_CARE_PROVIDER_SITE_OTHER): Payer: BC Managed Care – PPO | Admitting: General Practice

## 2013-10-18 DIAGNOSIS — Z7901 Long term (current) use of anticoagulants: Secondary | ICD-10-CM

## 2013-10-18 DIAGNOSIS — I4891 Unspecified atrial fibrillation: Secondary | ICD-10-CM

## 2013-10-18 DIAGNOSIS — Z23 Encounter for immunization: Secondary | ICD-10-CM

## 2013-10-25 ENCOUNTER — Ambulatory Visit (INDEPENDENT_AMBULATORY_CARE_PROVIDER_SITE_OTHER): Payer: BC Managed Care – PPO | Admitting: *Deleted

## 2013-10-25 DIAGNOSIS — I4891 Unspecified atrial fibrillation: Secondary | ICD-10-CM

## 2013-10-25 DIAGNOSIS — Z7901 Long term (current) use of anticoagulants: Secondary | ICD-10-CM

## 2013-10-25 LAB — POCT INR: INR: 1.9

## 2013-10-29 ENCOUNTER — Telehealth: Payer: Self-pay | Admitting: Pharmacist

## 2013-10-29 NOTE — Telephone Encounter (Signed)
Spoke with pt.  He is aware of Dr. Ludwig Clarks recommendations and will let us know next week if he would like to proceed with TEE or wait additional 2 weeks for DCCV.

## 2013-10-29 NOTE — Telephone Encounter (Signed)
Message copied by Evie Lacks on Fri Oct 29, 2013  2:30 PM ------      Message from: Lewayne Bunting      Created: Wed Oct 27, 2013  4:59 PM       Recheck in one week and if therapeutic, let Stanton Kidney know to schedule TEE guided DCCV.      Olga Millers            ----- Message -----         From: Evie Lacks, [facility]         Sent: 10/27/2013   4:39 PM           To: Lewayne Bunting, MD            Pt pending DCCV.  Had 2 therapeutic INRs but was 1.9 yesterday.  Is concerned over having to recheck INRs for 3 weeks again.  He says he has to have procedure done this year for insurance or he will not be able to afford it.  Explained our only option other than waiting for 3 more therapeutic INRs was a TEE.  He says he would rather do that than wait.  He understands there are risks with this and would still like to proceed.  I told him I would discuss with you then let him know.              ------

## 2013-11-01 ENCOUNTER — Ambulatory Visit (INDEPENDENT_AMBULATORY_CARE_PROVIDER_SITE_OTHER): Payer: BC Managed Care – PPO | Admitting: *Deleted

## 2013-11-01 DIAGNOSIS — Z7901 Long term (current) use of anticoagulants: Secondary | ICD-10-CM

## 2013-11-01 DIAGNOSIS — I4891 Unspecified atrial fibrillation: Secondary | ICD-10-CM

## 2013-11-01 LAB — POCT INR: INR: 2.6

## 2013-11-03 ENCOUNTER — Telehealth: Payer: Self-pay | Admitting: Family Medicine

## 2013-11-03 DIAGNOSIS — J40 Bronchitis, not specified as acute or chronic: Secondary | ICD-10-CM

## 2013-11-03 NOTE — Telephone Encounter (Signed)
Refill- albuterol sulfate 0.083% inh sol ml. Inhale 3ml by nebulization every 6 hours as needed. Qty 90 last fill 10.20.14

## 2013-11-04 MED ORDER — ALBUTEROL SULFATE (2.5 MG/3ML) 0.083% IN NEBU: 2.5000 mg | INHALATION_SOLUTION | Freq: Four times a day (QID) | RESPIRATORY_TRACT | Status: DC | PRN

## 2013-11-04 NOTE — Telephone Encounter (Signed)
Rx request to pharmacy/[facility]  

## 2013-11-05 ENCOUNTER — Encounter: Payer: Self-pay | Admitting: Family Medicine

## 2013-11-05 ENCOUNTER — Ambulatory Visit (INDEPENDENT_AMBULATORY_CARE_PROVIDER_SITE_OTHER): Payer: BC Managed Care – PPO | Admitting: Family Medicine

## 2013-11-05 VITALS — BP 122/88 | HR 69 | Temp 98.0°F | Ht 75.0 in | Wt 236.1 lb

## 2013-11-05 DIAGNOSIS — I6350 Cerebral infarction due to unspecified occlusion or stenosis of unspecified cerebral artery: Secondary | ICD-10-CM

## 2013-11-05 DIAGNOSIS — Z23 Encounter for immunization: Secondary | ICD-10-CM

## 2013-11-05 DIAGNOSIS — G8929 Other chronic pain: Secondary | ICD-10-CM

## 2013-11-05 DIAGNOSIS — I639 Cerebral infarction, unspecified: Secondary | ICD-10-CM

## 2013-11-05 DIAGNOSIS — K769 Liver disease, unspecified: Secondary | ICD-10-CM

## 2013-11-05 DIAGNOSIS — J40 Bronchitis, not specified as acute or chronic: Secondary | ICD-10-CM

## 2013-11-05 DIAGNOSIS — I10 Essential (primary) hypertension: Secondary | ICD-10-CM

## 2013-11-05 DIAGNOSIS — I4891 Unspecified atrial fibrillation: Secondary | ICD-10-CM

## 2013-11-05 DIAGNOSIS — R945 Abnormal results of liver function studies: Secondary | ICD-10-CM

## 2013-11-05 DIAGNOSIS — J449 Chronic obstructive pulmonary disease, unspecified: Secondary | ICD-10-CM

## 2013-11-05 DIAGNOSIS — M7701 Medial epicondylitis, right elbow: Secondary | ICD-10-CM

## 2013-11-05 DIAGNOSIS — M1990 Unspecified osteoarthritis, unspecified site: Secondary | ICD-10-CM

## 2013-11-05 DIAGNOSIS — M7700 Medial epicondylitis, unspecified elbow: Secondary | ICD-10-CM

## 2013-11-05 DIAGNOSIS — M129 Arthropathy, unspecified: Secondary | ICD-10-CM

## 2013-11-05 MED ORDER — TRAMADOL HCL 50 MG PO TABS: ORAL_TABLET | ORAL | Status: DC

## 2013-11-05 MED ORDER — ALBUTEROL SULFATE (2.5 MG/3ML) 0.083% IN NEBU: 2.5000 mg | INHALATION_SOLUTION | Freq: Four times a day (QID) | RESPIRATORY_TRACT | Status: DC | PRN

## 2013-11-05 MED ORDER — FLUTICASONE-SALMETEROL 250-50 MCG/DOSE IN AEPB: 1.0000 | INHALATION_SPRAY | Freq: Two times a day (BID) | RESPIRATORY_TRACT | Status: DC

## 2013-11-05 MED ORDER — HYDROCODONE-ACETAMINOPHEN 5-325 MG PO TABS: 1.0000 | ORAL_TABLET | Freq: Three times a day (TID) | ORAL | Status: DC | PRN

## 2013-11-05 NOTE — Progress Notes (Signed)
Pre visit review using our clinic review tool, if applicable. No additional management support is needed unless otherwise documented below in the visit note. 

## 2013-11-05 NOTE — Patient Instructions (Signed)

## 2013-11-08 ENCOUNTER — Ambulatory Visit (INDEPENDENT_AMBULATORY_CARE_PROVIDER_SITE_OTHER): Payer: BC Managed Care – PPO | Admitting: *Deleted

## 2013-11-08 DIAGNOSIS — Z7901 Long term (current) use of anticoagulants: Secondary | ICD-10-CM

## 2013-11-08 DIAGNOSIS — I4891 Unspecified atrial fibrillation: Secondary | ICD-10-CM

## 2013-11-09 ENCOUNTER — Encounter: Payer: Self-pay | Admitting: Family Medicine

## 2013-11-09 NOTE — Assessment & Plan Note (Signed)
Repeat labs prior to next visit, minimize simple carbs

## 2013-11-09 NOTE — Assessment & Plan Note (Signed)
Stable on current meds, no concerns, no longer smoking, no changes. Given Pneumonia shot today

## 2013-11-09 NOTE — Progress Notes (Signed)
Patient ID: Marc Hansen, male   DOB: 11/20/1961, 52 y.o.   MRN: [id number] Marc Hansen [id number] [birth year] 11/09/2013      Progress Note-Follow Up  Subjective  Chief Complaint  Chief Complaint  Patient presents with  . Medication Refill  . left ear    "pops"    HPI  Patient is a 52 year old Caucasian male who is in today to reestablish care. We have not seen him in quite some time. Since his last visit with [place] he has suffered a stroke and being found in atrial fibrillation. He is taking his medications routinely. He denies any palpitations, chest pain or shortness of breath. He's had no more neurologic events and is left with significant deficits. Struggles with chronic pain back pain joint pain but offers no other acute complaints. No recent illness fevers, headaches  Past Medical History  Diagnosis Date  . Allergy     seasonal  . Hypertension   . Overweight(278.02) 04/17/2011  . Arthritis 04/17/2011  . GERD (gastroesophageal reflux disease) 04/17/2011  . Vitamin D deficiency 04/17/2011  . Hand crush injury 07/05/2011  . Umbilical hernia 07/05/2011  . Hyperlipidemia 07/16/2011  . Atrial fibrillation 06/05/13    New dx in hosp for w/u for CVA--pradaxa started  . Abnormal liver function 08/20/2012  . Medial epicondylitis of right elbow 11/09/2012  . CVA (cerebral infarction) 06/05/13    A-fib new dx.  Carotid dopplers neg bilat.  MRI brain: moderate sized partially hemorrhagic left parietal lobe infarct.  Diffuse intracranial atherosclerotic changes noted on MRA brain.  . Mitral regurgitation   . COPD (chronic obstructive pulmonary disease) 08/13/2013    Past Surgical History  Procedure Laterality Date  . Vasectomy    . Carpal tunnel release      right, needed some nerve repair as well  . Right knee arthroscopy    . Tee without cardioversion N/A 08/30/2013    Procedure: TRANSESOPHAGEAL ECHOCARDIOGRAM (TEE);  Surgeon: Lewayne Bunting, MD;  Location: [facility] ENDOSCOPY;  Service:  Cardiovascular;  Laterality: N/A;    Family History  Problem Relation Age of Onset  . Cancer Mother 46    breast- remission  . Fibromyalgia Mother   . Other Father     CHF- stent   . Heart attack Father   . Heart disease Maternal Uncle   . Heart disease Maternal Grandmother   . Heart disease Maternal Grandfather     History   Social History  . Marital Status: Married    Spouse Name: N/A    Number of Children: 3  . Years of Education: N/A   Occupational History  .     Social History Main Topics  . Smoking status: Former Smoker    Types: Cigarettes  . Smokeless tobacco: Never Used     Comment: uses a vap every now and then  . Alcohol Use: Yes     Comment: 24 beers weekly  . Drug Use: No  . Sexual Activity: Yes    Partners: Female   Other Topics Concern  . Not on file   Social History Narrative  . No narrative on file    Current Outpatient Prescriptions on File Prior to Visit  Medication Sig Dispense Refill  . b complex vitamins tablet Take 1 tablet by mouth daily.      . cholecalciferol (VITAMIN D) 1000 UNITS tablet Take 1,000 Units by mouth daily.      Marland Kitchen diltiazem (CARDIZEM CD) 180 MG 24 hr capsule Take  1 capsule (180 mg total) by mouth daily.  30 capsule  12  . fish oil-omega-3 fatty acids 1000 MG capsule Take 2 capsules (2 g total) by mouth 2 (two) times daily.      . folic acid (FOLVITE) 1 MG tablet Take 1 tablet (1 mg total) by mouth daily.  30 tablet  0  . losartan (COZAAR) 50 MG tablet Take 1 tablet (50 mg total) by mouth daily.  90 tablet  2  . MILK THISTLE PO Take 1 tablet by mouth daily.      Marland Kitchen omeprazole (PRILOSEC) 40 MG capsule Take 40 mg by mouth every 3 (three) days.      . pravastatin (PRAVACHOL) 40 MG tablet Take 1 tablet (40 mg total) by mouth every evening.  90 tablet  3  . thiamine 100 MG tablet Take 1 tablet (100 mg total) by mouth daily.  30 tablet  0  . warfarin (COUMADIN) 5 MG tablet Take 1 tablet (5 mg total) by mouth as directed.  50  tablet  3  . [DISCONTINUED] esomeprazole (NEXIUM) 40 MG capsule Take 1 capsule (40 mg total) by mouth daily.  30 capsule  5  . [DISCONTINUED] Fluticasone Propionate, Inhal, (FLOVENT DISKUS) 100 MCG/BLIST AEPB Prn   60 each  1  . [DISCONTINUED] loratadine (CLARITIN) 10 MG tablet Take 1 tablet (10 mg total) by mouth daily.  30 tablet  2   No current facility-administered medications on file prior to visit.    No Known Allergies  Review of Systems  Review of Systems  Constitutional: Negative for fever and malaise/fatigue.  HENT: Negative for congestion.   Eyes: Negative for discharge.  Respiratory: Negative for shortness of breath.   Cardiovascular: Negative for chest pain, palpitations and leg swelling.  Gastrointestinal: Negative for nausea, abdominal pain and diarrhea.  Genitourinary: Negative for dysuria.  Musculoskeletal: Positive for back pain and joint pain. Negative for falls.  Skin: Negative for Hansen.  Neurological: Negative for loss of consciousness and headaches.  Endo/Heme/Allergies: Negative for polydipsia.  Psychiatric/Behavioral: Negative for depression and suicidal ideas. The patient is not nervous/anxious and does not have insomnia.     Objective  BP 122/88  Pulse 69  Temp(Src) 98 F (36.7 C) (Oral)  Ht 6\' 3"  (1.905 m)  Wt 236 lb 1.9 oz (107.103 kg)  BMI 29.51 kg/m2  SpO2 97%  Physical Exam  Physical Exam  Constitutional: He is oriented to person, place, and time and well-developed, well-nourished, and in no distress. No distress.  HENT:  Head: Normocephalic and atraumatic.  Eyes: Conjunctivae are normal.  Neck: Neck supple. No thyromegaly present.  Cardiovascular: Normal rate and normal heart sounds.   No murmur heard. Irregularly irregular  Pulmonary/Chest: Effort normal and breath sounds normal. No respiratory distress. He has no wheezes.  Abdominal: He exhibits no distension and no mass. There is no tenderness.  Musculoskeletal: He exhibits no  edema.  Neurological: He is alert and oriented to person, place, and time.  Skin: Skin is warm.  Psychiatric: Memory, affect and judgment normal.    Lab Results  Component Value Date   TSH 2.08 08/17/2012   Lab Results  Component Value Date   WBC 9.1 06/08/2013   HGB 14.7 06/08/2013   HCT 42.5 06/08/2013   MCV 87.3 06/08/2013   PLT 235 06/08/2013   Lab Results  Component Value Date   CREATININE 1.13 06/08/2013   BUN 18 06/08/2013   NA 138 06/08/2013   K 4.3 06/08/2013  CL 102 06/08/2013   CO2 28 06/08/2013   Lab Results  Component Value Date   ALT 108* 06/05/2013   AST 76* 06/05/2013   ALKPHOS 73 06/05/2013   BILITOT 0.5 06/05/2013   Lab Results  Component Value Date   CHOL 187 06/06/2013   Lab Results  Component Value Date   HDL 31* 06/06/2013   Lab Results  Component Value Date   LDLCALC 132* 06/06/2013   Lab Results  Component Value Date   TRIG 119 06/06/2013   Lab Results  Component Value Date   CHOLHDL 6.0 06/06/2013     Assessment & Plan  HTN (hypertension) Well controlled, no change in meds today  Atrial fibrillation Rate controlled,tolerating coumadin is planning on an ablation later this year. No concerning symptoms at this time  CVA (cerebral infarction) No repeat events  COPD (chronic obstructive pulmonary disease) Stable on current meds, no concerns, no longer smoking, no changes. Given Pneumonia shot today  Abnormal liver function Repeat labs prior to next visit, minimize simple carbs  Medial epicondylitis of right elbow Allowed refills on pain meds today

## 2013-11-09 NOTE — Assessment & Plan Note (Signed)
No repeat events

## 2013-11-09 NOTE — Assessment & Plan Note (Signed)
Allowed refills on pain meds today

## 2013-11-09 NOTE — Assessment & Plan Note (Signed)
Rate controlled,tolerating coumadin is planning on an ablation later this year. No concerning symptoms at this time

## 2013-11-09 NOTE — Assessment & Plan Note (Signed)
Well controlled, no change in meds today 

## 2013-11-12 ENCOUNTER — Telehealth: Payer: Self-pay | Admitting: Family Medicine

## 2013-11-12 MED ORDER — OMEPRAZOLE 40 MG PO CPDR: 40.0000 mg | DELAYED_RELEASE_CAPSULE | ORAL | Status: DC

## 2013-11-12 NOTE — Telephone Encounter (Signed)
Rx request to pharmacy/[facility]  

## 2013-11-12 NOTE — Telephone Encounter (Signed)
Refill omeprazole dr 20 mg cer #60 take 1 capsule by mouth twice daily may alternate with ranitidine last fill 7-14-

## 2013-11-15 ENCOUNTER — Ambulatory Visit (INDEPENDENT_AMBULATORY_CARE_PROVIDER_SITE_OTHER): Payer: BC Managed Care – PPO | Admitting: *Deleted

## 2013-11-15 ENCOUNTER — Other Ambulatory Visit: Payer: Self-pay | Admitting: Family Medicine

## 2013-11-15 DIAGNOSIS — Z7901 Long term (current) use of anticoagulants: Secondary | ICD-10-CM

## 2013-11-15 DIAGNOSIS — I4891 Unspecified atrial fibrillation: Secondary | ICD-10-CM

## 2013-11-15 DIAGNOSIS — I10 Essential (primary) hypertension: Secondary | ICD-10-CM

## 2013-11-15 MED ORDER — WARFARIN SODIUM 5 MG PO TABS: 5.0000 mg | ORAL_TABLET | ORAL | Status: DC

## 2013-11-15 MED ORDER — LOSARTAN POTASSIUM 50 MG PO TABS: 50.0000 mg | ORAL_TABLET | Freq: Every day | ORAL | Status: DC

## 2013-11-15 NOTE — Telephone Encounter (Signed)
Please advise if Losartan dose changed?

## 2013-11-15 NOTE — Telephone Encounter (Signed)
We got an rx request form from patient's pharmacy for losartan 50mg  90tabs but the erquest says that patient's believes dose has changed.

## 2013-11-15 NOTE — Telephone Encounter (Signed)
So I have only seen him once this past year and I did not change his dose. Our machine still says 50 mg on the Losartan, he will need to check his bottle to confirm dose change, If no change am willing to Rx Losartan 50 mg tabs 1 tab po daily, disp #90 with 1 rf

## 2013-11-15 NOTE — Telephone Encounter (Signed)
I called pt and he thought this was his coumadin. Pt does only take 1 tablet of the Losartan

## 2013-11-21 ENCOUNTER — Emergency Department (HOSPITAL_COMMUNITY): Payer: BC Managed Care – PPO

## 2013-11-21 ENCOUNTER — Encounter (HOSPITAL_COMMUNITY): Payer: Self-pay | Admitting: Emergency Medicine

## 2013-11-21 ENCOUNTER — Observation Stay (HOSPITAL_COMMUNITY)
Admission: EM | Admit: 2013-11-21 | Discharge: 2013-11-23 | Disposition: A | Discharge status: Home / Self Care | Payer: BC Managed Care – PPO | Attending: Internal Medicine | Admitting: Internal Medicine

## 2013-11-21 DIAGNOSIS — S0180XA Unspecified open wound of other part of head, initial encounter: Secondary | ICD-10-CM | POA: Insufficient documentation

## 2013-11-21 DIAGNOSIS — M7701 Medial epicondylitis, right elbow: Secondary | ICD-10-CM

## 2013-11-21 DIAGNOSIS — Z79899 Other long term (current) drug therapy: Secondary | ICD-10-CM | POA: Insufficient documentation

## 2013-11-21 DIAGNOSIS — Z8673 Personal history of transient ischemic attack (TIA), and cerebral infarction without residual deficits: Secondary | ICD-10-CM | POA: Insufficient documentation

## 2013-11-21 DIAGNOSIS — I059 Rheumatic mitral valve disease, unspecified: Secondary | ICD-10-CM | POA: Insufficient documentation

## 2013-11-21 DIAGNOSIS — E663 Overweight: Secondary | ICD-10-CM

## 2013-11-21 DIAGNOSIS — I10 Essential (primary) hypertension: Secondary | ICD-10-CM | POA: Insufficient documentation

## 2013-11-21 DIAGNOSIS — E785 Hyperlipidemia, unspecified: Secondary | ICD-10-CM | POA: Insufficient documentation

## 2013-11-21 DIAGNOSIS — J4489 Other specified chronic obstructive pulmonary disease: Secondary | ICD-10-CM | POA: Insufficient documentation

## 2013-11-21 DIAGNOSIS — IMO0002 Reserved for concepts with insufficient information to code with codable children: Secondary | ICD-10-CM | POA: Diagnosis present

## 2013-11-21 DIAGNOSIS — K219 Gastro-esophageal reflux disease without esophagitis: Secondary | ICD-10-CM | POA: Insufficient documentation

## 2013-11-21 DIAGNOSIS — I4891 Unspecified atrial fibrillation: Principal | ICD-10-CM | POA: Insufficient documentation

## 2013-11-21 DIAGNOSIS — F17201 Nicotine dependence, unspecified, in remission: Secondary | ICD-10-CM

## 2013-11-21 DIAGNOSIS — R55 Syncope and collapse: Secondary | ICD-10-CM | POA: Insufficient documentation

## 2013-11-21 DIAGNOSIS — M129 Arthropathy, unspecified: Secondary | ICD-10-CM | POA: Insufficient documentation

## 2013-11-21 DIAGNOSIS — I639 Cerebral infarction, unspecified: Secondary | ICD-10-CM | POA: Diagnosis present

## 2013-11-21 DIAGNOSIS — Z7901 Long term (current) use of anticoagulants: Secondary | ICD-10-CM

## 2013-11-21 DIAGNOSIS — Z87891 Personal history of nicotine dependence: Secondary | ICD-10-CM | POA: Insufficient documentation

## 2013-11-21 DIAGNOSIS — R945 Abnormal results of liver function studies: Secondary | ICD-10-CM

## 2013-11-21 DIAGNOSIS — I340 Nonrheumatic mitral (valve) insufficiency: Secondary | ICD-10-CM

## 2013-11-21 DIAGNOSIS — W19XXXA Unspecified fall, initial encounter: Secondary | ICD-10-CM | POA: Insufficient documentation

## 2013-11-21 DIAGNOSIS — F1210 Cannabis abuse, uncomplicated: Secondary | ICD-10-CM | POA: Insufficient documentation

## 2013-11-21 DIAGNOSIS — F1010 Alcohol abuse, uncomplicated: Secondary | ICD-10-CM | POA: Insufficient documentation

## 2013-11-21 DIAGNOSIS — J449 Chronic obstructive pulmonary disease, unspecified: Secondary | ICD-10-CM

## 2013-11-21 HISTORY — DX: Alcohol abuse, uncomplicated: F10.10

## 2013-11-21 HISTORY — DX: Other seasonal allergic rhinitis: J30.2

## 2013-11-21 HISTORY — DX: Cardiomyopathy, unspecified: I42.9

## 2013-11-21 LAB — COMPREHENSIVE METABOLIC PANEL
Alkaline Phosphatase: 71 U/L (ref 39–117)
BUN: 24 mg/dL — ABNORMAL HIGH (ref 6–23)
CO2: 22 mEq/L (ref 19–32)
Calcium: 10.2 mg/dL (ref 8.4–10.5)
Chloride: 97 mEq/L (ref 96–112)
Creatinine, Ser: 1.22 mg/dL (ref 0.50–1.35)
GFR calc Af Amer: 78 mL/min — ABNORMAL LOW (ref >90)
GFR calc non Af Amer: 68 mL/min — ABNORMAL LOW (ref >90)
Glucose, Bld: 112 mg/dL — ABNORMAL HIGH (ref 70–99)
Total Bilirubin: 0.3 mg/dL (ref 0.3–1.2)

## 2013-11-21 LAB — CBC WITH DIFFERENTIAL/PLATELET
Basophils Absolute: 0.1 10*3/uL (ref 0.0–0.1)
Eosinophils Relative: 2 % (ref 0–5)
HCT: 44.8 % (ref 39.0–52.0)
Lymphocytes Relative: 33 % (ref 12–46)
MCH: 30 pg (ref 26.0–34.0)
MCV: 86.2 fL (ref 78.0–100.0)
Monocytes Absolute: 0.9 10*3/uL (ref 0.1–1.0)
Monocytes Relative: 9 % (ref 3–12)
Platelets: 256 10*3/uL (ref 150–400)
RDW: 12.8 % (ref 11.5–15.5)
WBC: 10.4 10*3/uL (ref 4.0–10.5)

## 2013-11-21 LAB — PROTIME-INR
INR: 2.04 — ABNORMAL HIGH (ref 0.00–1.49)
Prothrombin Time: 22.4 seconds — ABNORMAL HIGH (ref 11.6–15.2)

## 2013-11-21 LAB — POCT I-STAT TROPONIN I: Troponin i, poc: 0 ng/mL (ref 0.00–0.08)

## 2013-11-21 MED ORDER — PANTOPRAZOLE SODIUM 40 MG PO TBEC
40.0000 mg | DELAYED_RELEASE_TABLET | Freq: Every day | ORAL | Status: DC
  Administered 2013-11-22: 40 mg via ORAL
  Filled 2013-11-21: qty 1

## 2013-11-21 MED ORDER — SODIUM CHLORIDE 0.9 % IJ SOLN
3.0000 mL | Freq: Two times a day (BID) | INTRAMUSCULAR | Status: DC
  Administered 2013-11-21: 3 mL via INTRAVENOUS

## 2013-11-21 MED ORDER — ALBUTEROL SULFATE (5 MG/ML) 0.5% IN NEBU: 2.5000 mg | INHALATION_SOLUTION | Freq: Four times a day (QID) | RESPIRATORY_TRACT | Status: DC

## 2013-11-21 MED ORDER — MOMETASONE FURO-FORMOTEROL FUM 100-5 MCG/ACT IN AERO
2.0000 | INHALATION_SPRAY | Freq: Two times a day (BID) | RESPIRATORY_TRACT | Status: DC
  Filled 2013-11-21 (×3): qty 8.8

## 2013-11-21 MED ORDER — VITAMIN B-1 100 MG PO TABS
100.0000 mg | ORAL_TABLET | Freq: Every day | ORAL | Status: DC
  Filled 2013-11-21 (×2): qty 1

## 2013-11-21 MED ORDER — HYDROCODONE-ACETAMINOPHEN 5-325 MG PO TABS
1.0000 | ORAL_TABLET | Freq: Four times a day (QID) | ORAL | Status: DC | PRN
  Administered 2013-11-22 – 2013-11-23 (×3): 1 via ORAL
  Filled 2013-11-21 (×4): qty 1

## 2013-11-21 MED ORDER — FENTANYL CITRATE 0.05 MG/ML IJ SOLN
50.0000 ug | Freq: Once | INTRAMUSCULAR | Status: AC
  Administered 2013-11-21: 50 ug via INTRAVENOUS
  Filled 2013-11-21: qty 2

## 2013-11-21 MED ORDER — LOSARTAN POTASSIUM 50 MG PO TABS
50.0000 mg | ORAL_TABLET | Freq: Every day | ORAL | Status: DC
  Administered 2013-11-22 – 2013-11-23 (×2): 50 mg via ORAL
  Filled 2013-11-21 (×2): qty 1

## 2013-11-21 MED ORDER — ONDANSETRON HCL 4 MG/2ML IJ SOLN: 4.0000 mg | Freq: Four times a day (QID) | INTRAMUSCULAR | Status: DC | PRN

## 2013-11-21 MED ORDER — FOLIC ACID 1 MG PO TABS
1.0000 mg | ORAL_TABLET | Freq: Every day | ORAL | Status: DC
  Filled 2013-11-21 (×2): qty 1

## 2013-11-21 MED ORDER — DOCUSATE SODIUM 100 MG PO CAPS
100.0000 mg | ORAL_CAPSULE | Freq: Two times a day (BID) | ORAL | Status: DC
  Administered 2013-11-22: 100 mg via ORAL
  Filled 2013-11-21 (×5): qty 1

## 2013-11-21 MED ORDER — ONDANSETRON HCL 4 MG PO TABS: 4.0000 mg | ORAL_TABLET | Freq: Four times a day (QID) | ORAL | Status: DC | PRN

## 2013-11-21 MED ORDER — SODIUM CHLORIDE 0.9 % IV SOLN
INTRAVENOUS | Status: DC
  Administered 2013-11-21 – 2013-11-22 (×2): via INTRAVENOUS

## 2013-11-21 MED ORDER — SIMVASTATIN 20 MG PO TABS
20.0000 mg | ORAL_TABLET | Freq: Every day | ORAL | Status: DC
  Administered 2013-11-22: 20 mg via ORAL
  Filled 2013-11-21: qty 1

## 2013-11-21 MED ORDER — MORPHINE SULFATE 2 MG/ML IJ SOLN
2.0000 mg | INTRAMUSCULAR | Status: DC | PRN
  Administered 2013-11-21 – 2013-11-23 (×5): 2 mg via INTRAVENOUS
  Filled 2013-11-21 (×5): qty 1

## 2013-11-21 MED ORDER — DILTIAZEM HCL ER COATED BEADS 180 MG PO CP24
180.0000 mg | ORAL_CAPSULE | Freq: Every day | ORAL | Status: DC
  Administered 2013-11-22: 180 mg via ORAL
  Filled 2013-11-21 (×2): qty 1

## 2013-11-21 MED ORDER — ALBUTEROL SULFATE HFA 108 (90 BASE) MCG/ACT IN AERS
1.0000 | INHALATION_SPRAY | Freq: Four times a day (QID) | RESPIRATORY_TRACT | Status: DC | PRN
  Filled 2013-11-21: qty 6.7

## 2013-11-21 NOTE — ED Notes (Signed)
Report called patient to be transfer to [facility] [place], no c/os of pain,

## 2013-11-21 NOTE — ED Provider Notes (Signed)
CSN: [id number]     Arrival date & time 11/21/13  1845 History   First MD Initiated Contact with Patient 11/21/13 1851     Chief Complaint  Patient presents with  . Near Syncope   (Consider location/radiation/quality/duration/timing/severity/associated sxs/prior Treatment) HPI Patient presents after syncopal episode with head trauma.  Patient.recalls feeling lightheaded, then had an episode of no recollection. Upon awakening he had no chest pain, no dyspnea, no confusion, no disorientation, he did have pain in his left frontal area.  No visual changes.  No neck pain.  He states that he was in his usual state of health prior to this event.    Past Medical History  Diagnosis Date  . Allergy     seasonal  . Hypertension   . Overweight(278.02) 04/17/2011  . Arthritis 04/17/2011  . GERD (gastroesophageal reflux disease) 04/17/2011  . Vitamin D deficiency 04/17/2011  . Hand crush injury 07/05/2011  . Umbilical hernia 07/05/2011  . Hyperlipidemia 07/16/2011  . Atrial fibrillation 06/05/13    New dx in hosp for w/u for CVA--pradaxa started  . Abnormal liver function 08/20/2012  . Medial epicondylitis of right elbow 11/09/2012  . CVA (cerebral infarction) 06/05/13    A-fib new dx.  Carotid dopplers neg bilat.  MRI brain: moderate sized partially hemorrhagic left parietal lobe infarct.  Diffuse intracranial atherosclerotic changes noted on MRA brain.  . Mitral regurgitation   . COPD (chronic obstructive pulmonary disease) 08/13/2013  . Stroke    Past Surgical History  Procedure Laterality Date  . Vasectomy    . Carpal tunnel release      right, needed some nerve repair as well  . Right knee arthroscopy    . Tee without cardioversion N/A 08/30/2013    Procedure: TRANSESOPHAGEAL ECHOCARDIOGRAM (TEE);  Surgeon: Lewayne Bunting, MD;  Location: [facility] ENDOSCOPY;  Service: Cardiovascular;  Laterality: N/A;   Family History  Problem Relation Age of Onset  . Cancer Mother 72    breast- remission  .  Fibromyalgia Mother   . Other Father     CHF- stent   . Heart attack Father   . Heart disease Maternal Uncle   . Heart disease Maternal Grandmother   . Heart disease Maternal Grandfather    History  Substance Use Topics  . Smoking status: Former Smoker    Types: Cigarettes  . Smokeless tobacco: Never Used     Comment: uses a vap every now and then  . Alcohol Use: Yes     Comment: 24 beers weekly    Review of Systems  All other systems reviewed and are negative.    Allergies  Review of patient's allergies indicates no known allergies.  Home Medications   Current Outpatient Rx  Name  Route  Sig  Dispense  Refill  . albuterol (PROVENTIL HFA;VENTOLIN HFA) 108 (90 BASE) MCG/ACT inhaler   Inhalation   Inhale 1-2 puffs into the lungs every 6 (six) hours as needed for wheezing or shortness of breath.         Marland Kitchen albuterol (PROVENTIL) (2.5 MG/3ML) 0.083% nebulizer solution   Nebulization   Take 2.5 mg by nebulization every 6 (six) hours as needed for wheezing or shortness of breath.         . diltiazem (CARDIZEM CD) 180 MG 24 hr capsule   Oral   Take 180 mg by mouth daily.         . Fluticasone-Salmeterol (ADVAIR) 250-50 MCG/DOSE AEPB   Inhalation   Inhale 1 puff  into the lungs 2 (two) times daily.         . folic acid (FOLVITE) 1 MG tablet   Oral   Take 1 mg by mouth daily.         Marland Kitchen HYDROcodone-acetaminophen (NORCO/VICODIN) 5-325 MG per tablet   Oral   Take 1 tablet by mouth every 6 (six) hours as needed for moderate pain.         Marland Kitchen losartan (COZAAR) 50 MG tablet   Oral   Take 50 mg by mouth daily.         Marland Kitchen MILK THISTLE PO   Oral   Take 1 tablet by mouth daily.         . naproxen sodium (ANAPROX) 220 MG tablet   Oral   Take 440 mg by mouth 2 (two) times daily as needed (for pain/swelling).         Marland Kitchen omeprazole (PRILOSEC) 40 MG capsule   Oral   Take 40 mg by mouth every 3 (three) days.         . pravastatin (PRAVACHOL) 40 MG tablet    Oral   Take 40 mg by mouth every evening.         . thiamine (VITAMIN B-1) 100 MG tablet   Oral   Take 100 mg by mouth daily.         . traMADol (ULTRAM) 50 MG tablet   Oral   Take 50-100 mg by mouth 2 (two) times daily as needed for moderate pain.         Marland Kitchen warfarin (COUMADIN) 5 MG tablet   Oral   Take 7.5-10 mg by mouth daily. Takes 10mg  on Monday and Wednesday. Takes 7.5mg  on all other days          BP 146/92  Pulse 42  Resp 18  SpO2 93% Physical Exam  Nursing note and vitals reviewed. Constitutional: He is oriented to person, place, and time. He appears well-developed. No distress.  HENT:  Head: Normocephalic.    Left forehead laceration. No malocclusion, the face is stable.  No broken teeth.  Eyes: Conjunctivae and EOM are normal. Pupils are equal, round, and reactive to light.    Neck: Full passive range of motion without pain. Neck supple. No spinous process tenderness and no muscular tenderness present. No tracheal deviation present.  Cardiovascular: Normal rate and regular rhythm.   Pulmonary/Chest: Effort normal. No stridor. No respiratory distress.  Abdominal: He exhibits no distension. There is no tenderness.  Musculoskeletal: He exhibits no edema and no tenderness.  Neurological: He is alert and oriented to person, place, and time. No cranial nerve deficit. He exhibits normal muscle tone. Coordination normal.  Skin: Skin is warm and dry.  Psychiatric: He has a normal mood and affect.    ED Course  Procedures (including critical care time) Labs Review Labs Reviewed  PROTIME-INR - Abnormal; Notable for the following:    Prothrombin Time 22.4 (*)    INR 2.04 (*)    All other components within normal limits  CBC WITH DIFFERENTIAL  COMPREHENSIVE METABOLIC PANEL   Imaging Review Ct Head Wo Contrast  11/21/2013   CLINICAL DATA:  Head trauma.  On anticoagulation.  EXAM: CT HEAD WITHOUT CONTRAST  TECHNIQUE: Contiguous axial images were obtained from  the base of the skull through the vertex without intravenous contrast.  COMPARISON:  CT head 06/05/2013 and MR brain 06/06/2013  FINDINGS: There is focal encephalomalacia in the left parietal lobe consistent with  prior infarction. Negative for hemorrhage, hydrocephalus, mass effect, mass lesion, or evidence of acute infarction.  Unchanged polypoid lesion in the left maxillary sinus. Mucosal thickening in the posterior right maxillary sinus and multiple ethmoid air cells. Skull is intact. Soft tissues of the scalp are are unremarkable. Tiny calcification of the posterior right globe at the optic nerve insertion is unchanged.  IMPRESSION: 1. No acute intracranial abnormality. 2. Encephalomalacia in the left parietal lobe at site of previously described infarction (see MRI June 2014).   Electronically Signed   By: Britta Mccreedy M.D.   On: 11/21/2013 19:36    EKG Interpretation    Date/Time:  Sunday November 21 2013 18:56:39 EST Ventricular Rate:  98 PR Interval:    QRS Duration: 80 QT Interval:  358 QTC Calculation: 457 R Axis:   103 Text Interpretation:  Atrial fibrillation Right axis deviation Borderline low voltage, extremity leads Atrial fibrillation Abnormal ekg Confirmed by Gerhard Munch  MD [phone]) on 11/21/2013 7:32:50 PM           7:54 PM On repeat exam the patient is in no distress.  I discussed results with him and his wife.  He'll be admitted for further evaluation and management after laceration is repaired.  MDM  No diagnosis found. This patient with history of cardiac disease, atrial fibrillation presents after a syncopal episode.  With his risk factors, his head,  his use of anticoagulants, he was admitted for further evaluation, management, monitoring.  Laceration was repaired by PA Dammen.  Gerhard Munch, MD 11/21/13 2014

## 2013-11-21 NOTE — ED Notes (Signed)
Patient was at restaurant and felt dizzy and diaphoretic and walked outside then had syncopal episode hitting his head.

## 2013-11-21 NOTE — ED Provider Notes (Signed)
Marc Hansen S 8:00 PM patient discussed signout. Patient with syncopal episode on Coumadin with head and face injuries and lacerations. Patient will be admitted to the hospital is for observation and evaluation of syncope. I will assist in patient care by performing a laceration repair of face.   LACERATION REPAIR Performed by: Angus Seller Authorized by: Angus Seller Consent: Verbal consent obtained. Risks and benefits: risks, benefits and alternatives were discussed Consent given by: patient Patient identity confirmed: provided demographic data Prepped and Draped in normal sterile fashion Wound explored  Laceration Location: Right forehead  Laceration Length: 2.5 cm  No Foreign Bodies seen or palpated  Anesthesia: local infiltration  Local anesthetic: lidocaine 2% with epinephrine  Anesthetic total: 4 ml  Irrigation method: syringe Amount of cleaning: standard  Skin closure: Skin with 6-0 Prolene   Number of sutures: 7   Technique: Simple interrupted   Patient tolerance: Patient tolerated the procedure well with no immediate complications.   LACERATION REPAIR Performed by: Angus Seller Authorized by: Angus Seller Consent: Verbal consent obtained. Risks and benefits: risks, benefits and alternatives were discussed Consent given by: patient Patient identity confirmed: provided demographic data Prepped and Draped in normal sterile fashion Wound explored  Laceration Location: Left eyebrow  Laceration Length: 3 cm  No Foreign Bodies seen or palpated  Anesthesia: local infiltration  Local anesthetic: lidocaine 2% with epinephrine  Anesthetic total: 4 ml  Irrigation method: syringe Amount of cleaning: standard  Skin closure: Skin with 6-0 Prolene   Number of sutures: 9   Technique: Simple interrupted   Patient tolerance: Patient tolerated the procedure well with no immediate complications.    Angus Seller, PA-C 11/21/13 2119

## 2013-11-21 NOTE — ED Notes (Signed)
MD at bedside. Sutures were apply to lac above left eye.

## 2013-11-21 NOTE — Progress Notes (Signed)
Pt is currently in a-fib. HR up to 140s non-sustaining. Pt asymptomatic. MD made aware. Will continue to monitor the pt. Sanda Linger, RN

## 2013-11-22 ENCOUNTER — Encounter (HOSPITAL_COMMUNITY): Payer: Self-pay | Admitting: Physician Assistant

## 2013-11-22 ENCOUNTER — Telehealth: Payer: Self-pay | Admitting: Cardiology

## 2013-11-22 DIAGNOSIS — I4891 Unspecified atrial fibrillation: Secondary | ICD-10-CM

## 2013-11-22 DIAGNOSIS — R55 Syncope and collapse: Secondary | ICD-10-CM

## 2013-11-22 DIAGNOSIS — F1010 Alcohol abuse, uncomplicated: Secondary | ICD-10-CM | POA: Diagnosis present

## 2013-11-22 DIAGNOSIS — I6350 Cerebral infarction due to unspecified occlusion or stenosis of unspecified cerebral artery: Secondary | ICD-10-CM

## 2013-11-22 LAB — TROPONIN I
Troponin I: <0.3 ng/mL (ref <0.30)
Troponin I: <0.3 ng/mL (ref <0.30)

## 2013-11-22 LAB — PROTIME-INR
INR: 2.27 — ABNORMAL HIGH (ref 0.00–1.49)
Prothrombin Time: 24.3 seconds — ABNORMAL HIGH (ref 11.6–15.2)

## 2013-11-22 MED ORDER — VITAMIN B-1 100 MG PO TABS
100.0000 mg | ORAL_TABLET | Freq: Every day | ORAL | Status: DC
  Administered 2013-11-22: 11:00:00 100 mg via ORAL
  Filled 2013-11-22 (×2): qty 1

## 2013-11-22 MED ORDER — WARFARIN - PHARMACIST DOSING INPATIENT: Freq: Every day | Status: DC

## 2013-11-22 MED ORDER — LORAZEPAM 1 MG PO TABS: 1.0000 mg | ORAL_TABLET | Freq: Four times a day (QID) | ORAL | Status: DC | PRN

## 2013-11-22 MED ORDER — ADULT MULTIVITAMIN W/MINERALS CH
1.0000 | ORAL_TABLET | Freq: Every day | ORAL | Status: DC
  Administered 2013-11-22: 1 via ORAL
  Filled 2013-11-22 (×2): qty 1

## 2013-11-22 MED ORDER — WARFARIN SODIUM 10 MG PO TABS
12.5000 mg | ORAL_TABLET | ORAL | Status: AC
  Administered 2013-11-22: 11:00:00 12.5 mg via ORAL
  Filled 2013-11-22: qty 1

## 2013-11-22 MED ORDER — THIAMINE HCL 100 MG/ML IJ SOLN
100.0000 mg | Freq: Every day | INTRAMUSCULAR | Status: DC
  Filled 2013-11-22 (×2): qty 1

## 2013-11-22 MED ORDER — FOLIC ACID 1 MG PO TABS
1.0000 mg | ORAL_TABLET | Freq: Every day | ORAL | Status: DC
  Administered 2013-11-22: 1 mg via ORAL
  Filled 2013-11-22 (×2): qty 1

## 2013-11-22 MED ORDER — LORAZEPAM 2 MG/ML IJ SOLN: 1.0000 mg | Freq: Four times a day (QID) | INTRAMUSCULAR | Status: DC | PRN

## 2013-11-22 MED ORDER — PRAVASTATIN SODIUM 40 MG PO TABS
40.0000 mg | ORAL_TABLET | Freq: Every day | ORAL | Status: DC
  Filled 2013-11-22: qty 1

## 2013-11-22 NOTE — H&P (Signed)
[facility] History and Physical  [name] [name]:[id number] DOB: 17-Sep-1961    PCP:   Danise Edge, MD   Chief Complaint: syncope.  HPI:  Marc Hansen is an 52 y.o. male suffered a partial parietal hemorrhagic stroke, felt to be cardioembolic with hemorrhagic conversion, and afib with moderate to severe MR found at that time, now on coumadin, presents with syncope after mild lightheadedness prodrome.  He has a deep laceration on his left front and another smaller on the contralateral side.  Head CT was negative for acute intracranial bleed, and EKG showed afib with no ischemic changes.  He denied chest pain or shortness of breath.  He had UTD with his tetanus shots.  In the ER, he was sutured by Ivonne Andrew, PA_C, and hospitalist was asked to admit him for syncope work up.  [name], he was started on Pradaxa, but now on therapeutic coumadin.  His lab work were unremarkable.   Rewiew of Systems:  Constitutional: Negative for malaise, fever and chills. No significant weight loss or weight gain Eyes: Negative for eye pain, redness and discharge, diplopia, visual changes, or flashes of light. ENMT: Negative for ear pain, hoarseness, nasal congestion, sinus pressure and sore throat. No headaches; tinnitus, drooling, or problem swallowing. Cardiovascular: Negative for chest pain, palpitations, diaphoresis, dyspnea and peripheral edema. ; No orthopnea, PND Respiratory: Negative for cough, hemoptysis, wheezing and stridor. No pleuritic chestpain. Gastrointestinal: Negative for nausea, vomiting, diarrhea, constipation, abdominal pain, melena, blood in stool, hematemesis, jaundice and rectal bleeding.    Genitourinary: Negative for frequency, dysuria, incontinence,flank pain and hematuria; Musculoskeletal: Negative for back pain and neck pain.  Skin: . Negative for pruritus, rash, abrasions, bruising and skin lesion.; ulcerations Neuro: Negative for headache, lightheadedness and neck  stiffness. Negative for weakness, altered level of consciousness ,  extremity weakness, burning feet, involuntary movement, seizure and syncope.  Psych: negative for anxiety, depression, insomnia, tearfulness, panic attacks, hallucinations, paranoia, suicidal or homicidal ideation.   Past Medical History  Diagnosis Date  . Allergy     seasonal  . Hypertension   . Overweight(278.02) 04/17/2011  . Arthritis 04/17/2011  . GERD (gastroesophageal reflux disease) 04/17/2011  . Vitamin D deficiency 04/17/2011  . Hand crush injury 07/05/2011  . Umbilical hernia 07/05/2011  . Hyperlipidemia 07/16/2011  . Atrial fibrillation 06/05/13    New dx in hosp for w/u for CVA--pradaxa started  . Abnormal liver function 08/20/2012  . Medial epicondylitis of right elbow 11/09/2012  . CVA (cerebral infarction) 06/05/13    A-fib new dx.  Carotid dopplers neg bilat.  MRI brain: moderate sized partially hemorrhagic left parietal lobe infarct.  Diffuse intracranial atherosclerotic changes noted on MRA brain.  . Mitral regurgitation   . COPD (chronic obstructive pulmonary disease) 08/13/2013  . Stroke     Past Surgical History  Procedure Laterality Date  . Vasectomy    . Carpal tunnel release      right, needed some nerve repair as well  . Right knee arthroscopy    . Tee without cardioversion N/A 08/30/2013    Procedure: TRANSESOPHAGEAL ECHOCARDIOGRAM (TEE);  Surgeon: Lewayne Bunting, MD;  Location: [facility] ENDOSCOPY;  Service: Cardiovascular;  Laterality: N/A;    Medications:  HOME MEDS: Prior to Admission medications   Medication Sig Start Date End Date Taking? Authorizing Provider  albuterol (PROVENTIL HFA;VENTOLIN HFA) 108 (90 BASE) MCG/ACT inhaler Inhale 1-2 puffs into the lungs every 6 (six) hours as needed for wheezing or shortness of breath.   Yes  Historical Provider, MD  albuterol (PROVENTIL) (2.5 MG/3ML) 0.083% nebulizer solution Take 2.5 mg by nebulization every 6 (six) hours as needed for wheezing or  shortness of breath.   Yes Historical Provider, MD  diltiazem (CARDIZEM CD) 180 MG 24 hr capsule Take 180 mg by mouth daily.   Yes Historical Provider, MD  Fluticasone-Salmeterol (ADVAIR) 250-50 MCG/DOSE AEPB Inhale 1 puff into the lungs 2 (two) times daily.   Yes Historical Provider, MD  folic acid (FOLVITE) 1 MG tablet Take 1 mg by mouth daily.   Yes Historical Provider, MD  HYDROcodone-acetaminophen (NORCO/VICODIN) 5-325 MG per tablet Take 1 tablet by mouth every 6 (six) hours as needed for moderate pain.   Yes Historical Provider, MD  losartan (COZAAR) 50 MG tablet Take 50 mg by mouth daily.   Yes Historical Provider, MD  MILK THISTLE PO Take 1 tablet by mouth daily.   Yes Historical Provider, MD  naproxen sodium (ANAPROX) 220 MG tablet Take 440 mg by mouth 2 (two) times daily as needed (for pain/swelling).   Yes Historical Provider, MD  omeprazole (PRILOSEC) 40 MG capsule Take 40 mg by mouth every 3 (three) days.   Yes Historical Provider, MD  pravastatin (PRAVACHOL) 40 MG tablet Take 40 mg by mouth every evening.   Yes Historical Provider, MD  thiamine (VITAMIN B-1) 100 MG tablet Take 100 mg by mouth daily.   Yes Historical Provider, MD  traMADol (ULTRAM) 50 MG tablet Take 50-100 mg by mouth 2 (two) times daily as needed for moderate pain.   Yes Historical Provider, MD  warfarin (COUMADIN) 5 MG tablet Take 7.5-10 mg by mouth daily. Takes 10mg  on Monday and Wednesday. Takes 7.5mg  on all other days   Yes Historical Provider, MD     Allergies:  No Known Allergies  Social History:   reports that he has quit smoking. His smoking use included Cigarettes. He has a 6.5 pack-year smoking history. He has never used smokeless tobacco. He reports that he drinks about 14.4 ounces of alcohol per week. He reports that he does not use illicit drugs.  Family History: Family History  Problem Relation Age of Onset  . Cancer Mother 44    breast- remission  . Fibromyalgia Mother   . Other Father      CHF- stent   . Heart attack Father   . Heart disease Maternal Uncle   . Heart disease Maternal Grandmother   . Heart disease Maternal Grandfather      Physical Exam: Filed Vitals:   11/21/13 2112 11/21/13 2115 11/21/13 2130 11/21/13 2158  BP: 130/70 128/93 119/78 130/85  Pulse: 90 56 104 98  Temp:    98.2 F (36.8 C)  TempSrc:    Oral  Resp: 13 18 25 20   Height:    6\' 4"  (1.93 m)  Weight:    107.502 kg (237 lb)  SpO2: 93% 98% 90% 96%   Blood pressure 130/85, pulse 98, temperature 98.2 F (36.8 C), temperature source Oral, resp. rate 20, height 6\' 4"  (1.93 m), weight 107.502 kg (237 lb), SpO2 96.00%.  GEN:  Pleasant  patient lying in the stretcher in no acute distress; cooperative with exam. PSYCH:  alert and oriented x4; does not appear anxious or depressed; affect is appropriate. HEENT: Mucous membranes pink and anicteric; PERRLA; EOM intact; no cervical lymphadenopathy nor thyromegaly or carotid bruit; no JVD; There were no stridor. Neck is very supple. Breasts:: Not examined CHEST WALL: No tenderness CHEST: Normal respiration, clear to auscultation  bilaterally.  HEART: iregular rate and rhythm.  There is  murmur, rub, or gallops.   BACK: No kyphosis or scoliosis; no CVA tenderness ABDOMEN: soft and non-tender; no masses, no organomegaly, normal abdominal bowel sounds; no pannus; no intertriginous candida. There is no rebound and no distention. Rectal Exam: Not done EXTREMITIES: No bone or joint deformity; age-appropriate arthropathy of the hands and knees; no edema; no ulcerations.  There is no calf tenderness. Genitalia: not examined PULSES: 2+ and symmetric SKIN: Normal hydration no rash or ulceration CNS: Cranial nerves 2-12 grossly intact no focal lateralizing neurologic deficit.  Speech is fluent; uvula elevated with phonation, facial symmetry and tongue midline. DTR are normal bilaterally, cerebella exam is intact, barbinski is negative and strengths are equaled  bilaterally.  No sensory loss.   Labs on Admission:  Basic Metabolic Panel:  Recent Labs Lab 11/21/13 1905  NA 134*  K 3.8  CL 97  CO2 22  GLUCOSE 112*  BUN 24*  CREATININE 1.22  CALCIUM 10.2   Liver Function Tests:  Recent Labs Lab 11/21/13 1905  AST 30  ALT 40  ALKPHOS 71  BILITOT 0.3  PROT 7.4  ALBUMIN 4.4   No results found for this basename: LIPASE, AMYLASE,  in the last 168 hours No results found for this basename: AMMONIA,  in the last 168 hours CBC:  Recent Labs Lab 11/21/13 1905  WBC 10.4  NEUTROABS 5.8  HGB 15.6  HCT 44.8  MCV 86.2  PLT 256   Cardiac Enzymes:  Recent Labs Lab 11/21/13 2331  TROPONINI <0.30    CBG: No results found for this basename: GLUCAP,  in the last 168 hours   Radiological Exams on Admission: Ct Head Wo Contrast  11/21/2013   CLINICAL DATA:  Head trauma.  On anticoagulation.  EXAM: CT HEAD WITHOUT CONTRAST  TECHNIQUE: Contiguous axial images were obtained from the base of the skull through the vertex without intravenous contrast.  COMPARISON:  CT head 06/05/2013 and MR brain 06/06/2013  FINDINGS: There is focal encephalomalacia in the left parietal lobe consistent with prior infarction. Negative for hemorrhage, hydrocephalus, mass effect, mass lesion, or evidence of acute infarction.  Unchanged polypoid lesion in the left maxillary sinus. Mucosal thickening in the posterior right maxillary sinus and multiple ethmoid air cells. Skull is intact. Soft tissues of the scalp are are unremarkable. Tiny calcification of the posterior right globe at the optic nerve insertion is unchanged.  IMPRESSION: 1. No acute intracranial abnormality. 2. Encephalomalacia in the left parietal lobe at site of previously described infarction (see MRI June 2014).   Electronically Signed   By: Britta Mccreedy M.D.   On: 11/21/2013 19:36    EKG: Independently reviewed. Afib with no ischemic changes.   Assessment/Plan Present on Admission:  . Syncope  and collapse . Atrial fibrillation . CVA (cerebral infarction) . Hyperlipidemia . Mitral regurgitation . Laceration  PLAN:  WIll admit him for cardiac monitoring.  Will cycle his troponin.  He had a TEE in Sep 14, so I have not ordered another echo.  Lac was repaired and he is up to date with dT.  May not find cause for his syncope, though I am suspicious of arrythmia.  I am also concern about the future anticoagulation in this gentleman with syncope, so please speak with cardiology  ( Dr Jens Som), neurology (Dr Anne Hahn) and his PCP (Dr Abner Greenspan) concerning future anticoagulation.  For now, I have continued his coumadin dosing per pharmacy.  This is also  pending on whether the cause of his syncope is found as well.   I would be concerned about his ability to drive as well.   He is stable at the present, and will [facility].  Thank you for asking me to participate in his care.  Other plans as per orders.  Code Status: FULL Unk Lightning, MD. [facility] Pager [phone] 7pm to 7am.  11/22/2013, 4:26 AM

## 2013-11-22 NOTE — Telephone Encounter (Signed)
Follow Up  Pt called admitted into the hospital// He states that they took his blood and checked coumadin levels in the hospital// Will these test be admitted because he does not wish to restart his coumadin levels// please assist.

## 2013-11-22 NOTE — ED Provider Notes (Signed)
Medical screening examination/treatment/procedure(s) were performed by non-physician practitioner and as supervising physician I was immediately available for consultation/collaboration.  EKG Interpretation    Date/Time:  Sunday November 21 2013 18:56:39 EST Ventricular Rate:  98 PR Interval:    QRS Duration: 80 QT Interval:  358 QTC Calculation: 457 R Axis:   103 Text Interpretation:  Atrial fibrillation Right axis deviation Borderline low voltage, extremity leads Atrial fibrillation Abnormal ekg Confirmed by Gerhard Munch  MD ([id number]) on 11/21/2013 7:32:50 PM             Shon Baton, MD 11/22/13 [phone]

## 2013-11-22 NOTE — Consult Note (Signed)
Cardiology Consultation Note  Patient ID: Marc Hansen, MRN: [id number], DOB/AGE: 1960/12/31 52 y.o. Admit date: 11/21/2013   Date of Consult: 11/22/2013 Primary Physician: Danise Edge, MD Primary Cardiologist: Dr. Jens Som  Chief Complaint: passed out Reason for Consult: syncope  HPI: Marc Hansen is a 52 y/o M with history of HTN, HL, COPD, ongoing EtOH abuse, CVA, afib who presented to [facility] yesterday with an episode of syncope. He was admitted 05/2013 for left parietal lobe infarct with hemorrhagic transformation, at which time he was also found to have newly recognized atrial fibrillation. Carotid dopplers showed 0-39% BICA, <40% LCCA. 2D Echo had shown an EF 45-50%, mod-severe MR, mod dilated LA. TSH was normal. He was initially started on Pradaxa in the hospital by neurology but this was later changed to Coumadin by Dr. Jens Som given his valvular disease. Holter monitor in July of 2014 showed atrial fibrillation with elevated heart rates during the day and some bradycardia at night. Atenolol was initially added but later felt to be causing dyspnea thus was changed to Cardizem. The patient underwent TEE in 08/30/13 to better evaluate his MR which showed EF 40-45%, diffuse HK, mod MR, mod dilated LA, no thrombus, no PFO, and ?pulmonic vegetation. The plan was to repeat echo in 6 months to follow valve, EF and consider ischemic evaluation in the future. More recently the patient was considering DCCV.  His wife has had a URI this past week. Yesterday afternoon, he felt general malaise and sluggishness like he was coming down with something. He got up to go to the refrigerator and while standing in front of it, he felt his legs and arms begin to feel shaky. He felt very dizzy so went and sat down. Symptoms resolved. Later that afternoon he went out to a restaurant with his wife. He went outside to talk with some friends then came back inside. Shortly after sitting down he developed  profuse diaphoresis but no other symptoms. He excused himself to go back outside to get some air and apparently passed out just a few steps out the door. Preceding this event he felt "fuzzy" but denies CP, palpitations or SOB. His wife ran to his side and he would not respond right away but then came to after about 30 seconds. He apparently was coherent and able to talk, but the first thing he recalled were the ambulance lights who arrived about 2 minutes later. No witnessed seizure activity, b/b incontinence. This is the only episode of syncope he's ever had besides an episode in his 20's due to low blood sugar from not eating property. With the Thanksgiving holiday last week, he's been eating/drinking normally if not moreso than usual. Workup thus far reveals troponin neg x 3, CBC WNL, CMET grossly WNL, except BUN 24 (Cr 1.22, generally has run 1.1-1.3 over this yr). CT head nonacute. EtOh level not drawn. INR today pending.  INR therapeutic at 2.04. He states the ER would not give him his Coumadin so he secretly took one of his 5mg  tablets last night. I made pharmacy aware of this but he had already been given his dose here in the hospital for the day. He also has been inadvertantly been taking Cozaar 75mg  daily instead of 50mg  daily, but this is not a recent change. Over the summer, his diltiazem was changed from 120mg  to 180mg  daily - he was initially told just to take 1.5 tablets daily but realized this was a capsule thus a new rx was  issued. He then mistook the previous instructions to be applied to his Cozaar. Orthostatics were negative this AM for BP but pulse did go up 20bpm - he was asx. Tele reveals afib with max pause 2.4sec overnight.  Orthostatics this AM:   BP  Pulse Lying  139/91  79 Sitting  144/88  85 Standing 137/85  102  INRs: 11/30 - 2.04 11/24 - 2.7 11/17 - 2.5 11/10 - 2.6 111/3 - 1.9  Past Medical History  Diagnosis Date  . Seasonal allergies   . Hypertension   .  Overweight(278.02)   . Arthritis   . GERD (gastroesophageal reflux disease)   . Vitamin D deficiency 04/17/2011  . Hand crush injury 07/05/2011  . Umbilical hernia 07/05/2011  . Hyperlipidemia   . Atrial fibrillation 06/05/13    a. Dx 05/2013 in setting of CVA. Initially on Pradaxa, changed to Coumadin due to MR.  Marland Kitchen Abnormal liver function 08/20/2012  . Medial epicondylitis of right elbow 11/09/2012  . CVA (cerebral infarction) 06/05/13    a. 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 Also new afib. Carotid dopplers 0-39% bilat IC stenosis.   . Mitral regurgitation     a. Mod by TEE 08/30/13.  Marland Kitchen COPD (chronic obstructive pulmonary disease)   . Alcohol abuse   . Cardiomyopathy     a. EF 45-50% in 05/2013, 40-45% by TEE 08/2013.      Most Recent Cardiac Studies: Carotid Dopplers 05/2013 Summary: Bilateral - Less than 40% stenosis of the left common carotid artery. There is 0 to 39% ICA stenosis bilaterally. Vertebral artery flow is antegrade bilaterally.  TEE 08/30/13 - Left ventricle: Systolic function was mildly to moderately reduced. The estimated ejection fraction was in the range of 40% to 45%. Diffuse hypokinesis.  - Aortic valve: No evidence of vegetation. - Mitral valve: No evidence of vegetation. Moderate regurgitation. - Left atrium: The atrium was moderately dilated. No evidence of thrombus in the atrial cavity or appendage. - Atrial septum: No defect or patent foramen ovale was identified. - Tricuspid valve: No evidence of vegetation. No evidence of vegetation. - Pulmonic valve: There was a vegetation. There was a vegetation.  2D Echo 06/07/13 - Left ventricle: Systolic function was mildly reduced. The estimated ejection fraction was in the range of 45% to 50%. - Mitral valve: Moderate to severe regurgitation. - Left atrium: The atrium was moderately dilated. - Pulmonary arteries: PA peak pressure: 32mm Hg (S).   Surgical History:  Past Surgical  History  Procedure Laterality Date  . Vasectomy    . Carpal tunnel release      right, needed some nerve repair as well  . Right knee arthroscopy    . Tee without cardioversion N/A 08/30/2013    Procedure: TRANSESOPHAGEAL ECHOCARDIOGRAM (TEE);  Surgeon: Lewayne Bunting, MD;  Location: [facility] ENDOSCOPY;  Service: Cardiovascular;  Laterality: N/A;     Home Meds: Prior to Admission medications   Medication Sig Start Date End Date Taking? Authorizing Provider  albuterol (PROVENTIL HFA;VENTOLIN HFA) 108 (90 BASE) MCG/ACT inhaler Inhale 1-2 puffs into the lungs every 6 (six) hours as needed for wheezing or shortness of breath.   Yes Historical Provider, MD  albuterol (PROVENTIL) (2.5 MG/3ML) 0.083% nebulizer solution Take 2.5 mg by nebulization every 6 (six) hours as needed for wheezing or shortness of breath.   Yes Historical Provider, MD  diltiazem (CARDIZEM CD) 180 MG 24 hr capsule Take 180 mg by mouth daily.   Yes Historical  Provider, MD  Fluticasone-Salmeterol (ADVAIR) 250-50 MCG/DOSE AEPB Inhale 1 puff into the lungs 2 (two) times daily.   Yes Historical Provider, MD  folic acid (FOLVITE) 1 MG tablet Take 1 mg by mouth daily.   Yes Historical Provider, MD  HYDROcodone-acetaminophen (NORCO/VICODIN) 5-325 MG per tablet Take 1 tablet by mouth every 6 (six) hours as needed for moderate pain.   Yes Historical Provider, MD  losartan (COZAAR) 50 MG tablet Take 50 mg by mouth daily.   Yes Historical Provider, MD  MILK THISTLE PO Take 1 tablet by mouth daily.   Yes Historical Provider, MD  naproxen sodium (ANAPROX) 220 MG tablet Take 440 mg by mouth 2 (two) times daily as needed (for pain/swelling).   Yes Historical Provider, MD  omeprazole (PRILOSEC) 40 MG capsule Take 40 mg by mouth every 3 (three) days.   Yes Historical Provider, MD  pravastatin (PRAVACHOL) 40 MG tablet Take 40 mg by mouth every evening.   Yes Historical Provider, MD  thiamine (VITAMIN B-1) 100 MG tablet Take 100 mg by mouth daily.    Yes Historical Provider, MD  traMADol (ULTRAM) 50 MG tablet Take 50-100 mg by mouth 2 (two) times daily as needed for moderate pain.   Yes Historical Provider, MD  warfarin (COUMADIN) 5 MG tablet Take 7.5-10 mg by mouth daily. Takes 10mg  on Monday and Wednesday. Takes 7.5mg  on all other days   Yes Historical Provider, MD    Inpatient Medications:  . albuterol  2.5 mg Nebulization Q6H  . diltiazem  180 mg Oral Daily  . docusate sodium  100 mg Oral BID  . folic acid  1 mg Oral Daily  . losartan  50 mg Oral Daily  . mometasone-formoterol  2 puff Inhalation BID  . multivitamin with minerals  1 tablet Oral Daily  . pantoprazole  40 mg Oral Daily  . simvastatin  20 mg Oral q1800  . sodium chloride  3 mL Intravenous Q12H  . thiamine  100 mg Oral Daily   Or  . thiamine  100 mg Intravenous Daily  . Warfarin - Pharmacist Dosing Inpatient   Does not apply q1800   . sodium chloride 50 mL/hr at 11/21/13 2156    Allergies:  Allergies  Allergen Reactions  . Atenolol     Felt contributing to dyspnea per Dr. Ludwig Clarks office note    History   Social History  . Marital Status: Married    Spouse Name: N/A    Number of Children: 3  . Years of Education: N/A   Occupational History  .     Social History Main Topics  . Smoking status: Former Smoker -- 0.25 packs/day for 26 years    Types: Cigarettes  . Smokeless tobacco: Never Used     Comment: uses a vap every now and then  . Alcohol Use: 29.4 oz/week    49 Cans of beer per week     Comment: 6-7 beers/daily, plus added cocktails on the weekend  . Drug Use: Yes     Comment: marijuana occasionally  . Sexual Activity: Yes    Partners: Female   Other Topics Concern  . Not on file   Social History Narrative   Does work with Secondary school teacher. Frequent travel.      Family History  Problem Relation Age of Onset  . Cancer Mother 2    breast- remission  . Fibromyalgia Mother   . Other Father     CHF- stent   . Heart  attack  Father   . Heart disease Maternal Uncle   . Heart disease Maternal Grandmother   . Heart disease Maternal Grandfather      Review of Systems: General: negative for chills, fever, night sweats Cardiovascular: negative for orthopnea, shortness of breath or dyspnea on exertion Dermatological: negative for rash Respiratory: occ cough, wheeze Urologic: negative for hematuria Abdominal: negative for nausea, vomiting, diarrhea, bright red blood per rectum, melena, or hematemesis Neurologic: see above All other systems reviewed and are otherwise negative except as noted above.  Labs:  Recent Labs  11/21/13 2331 11/22/13 0524  TROPONINI <0.30 <0.30   Lab Results  Component Value Date   WBC 10.4 11/21/2013   HGB 15.6 11/21/2013   HCT 44.8 11/21/2013   MCV 86.2 11/21/2013   PLT 256 11/21/2013     Recent Labs Lab 11/21/13 1905  NA 134*  K 3.8  CL 97  CO2 22  BUN 24*  CREATININE 1.22  CALCIUM 10.2  PROT 7.4  BILITOT 0.3  ALKPHOS 71  ALT 40  AST 30  GLUCOSE 112*   Lab Results  Component Value Date   CHOL 187 06/06/2013   HDL 31* 06/06/2013   LDLCALC 132* 06/06/2013   TRIG 119 06/06/2013   Radiology/Studies:  Ct Head Wo Contrast  11/21/2013   CLINICAL DATA:  Head trauma.  On anticoagulation.  EXAM: CT HEAD WITHOUT CONTRAST  TECHNIQUE: Contiguous axial images were obtained from the base of the skull through the vertex without intravenous contrast.  COMPARISON:  CT head 06/05/2013 and MR brain 06/06/2013  FINDINGS: There is focal encephalomalacia in the left parietal lobe consistent with prior infarction. Negative for hemorrhage, hydrocephalus, mass effect, mass lesion, or evidence of acute infarction.  Unchanged polypoid lesion in the left maxillary sinus. Mucosal thickening in the posterior right maxillary sinus and multiple ethmoid air cells. Skull is intact. Soft tissues of the scalp are are unremarkable. Tiny calcification of the posterior right globe at the optic nerve  insertion is unchanged.  IMPRESSION: 1. No acute intracranial abnormality. 2. Encephalomalacia in the left parietal lobe at site of previously described infarction (see MRI June 2014).   Electronically Signed   By: Britta Mccreedy M.D.   On: 11/21/2013 19:36    EKG: Atrial fib 98bpm, right axis devation, nonspecific ST-T changes, QTc 457  Physical Exam: Blood pressure 150/89, pulse 87, temperature 98.2 F (36.8 C), temperature source Oral, resp. rate 20, height 6\' 4"  (1.93 m), weight 237 lb (107.502 kg), SpO2 97.00%. General: Well developed, well nourished WM, in no acute distress. Head: Normocephalic, atraumatic, sclera non-icteric, no xanthomas, nares are without discharge. Lac repaired above L eye and right forehead Neck: Negative for carotid bruits. JVD not elevated. Lungs: Clear bilaterally to auscultation without wheezes, rales, or rhonchi. Breathing is unlabored. Heart: RRR with S1 S2. No murmurs, rubs, or gallops appreciated. Abdomen: Soft, non-tender, non-distended with normoactive bowel sounds. No hepatomegaly. No rebound/guarding. No obvious abdominal masses. Msk:  Strength and tone appear normal for age. Extremities: No clubbing or cyanosis. No edema.  Distal pedal pulses are 2+ and equal bilaterally. UE pulses equal bilat. Neuro: Alert and oriented X 3. No facial asymmetry. No focal deficit. Moves all extremities spontaneously. Psych:  Responds to questions appropriately with a normal affect.    Assessment and Plan:  1. Syncope 2. Persistent atrial fibrillation 3. 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 Moderate MR by TEE 08/2013 5. LV dysfunction EF 40-45% 6. Alcohol abuse,  marijuana abuse, educated on importance of cessation 7. HTN 8. Abnormal TEE with pulmonic vegetation, question error?  We suspect orthostatic event vs vasovagal at this time. Elevated BUN and elevated pulse with standing suggest more of a dehydrated state, perhaps  worsened by his alcoholism and recent viral illness. He was advised to discontinue EtOH and marijuana. He was also accidentally taking 1.5 tablets of Cozaar instead of 1 tablet daily as prescribed, although this has been for quite some time. Monitor on telemetry for now. INR remains therapeutic today. I made pharmacy aware that patient secretly self-administered 5mg  of Coumadin last night. Will make NPO after midnight. Dr. Jens Som to round on patient in AM to decide regarding DCCV this admission. Will also ask that Dr. Jens Som review TEE report from 08/2013 to make comment on pulmonic abnormality, which may just reflect dictation error given no clinical signs of endocarditis.    Signed, Dayna Dunn PA-C 11/22/2013, 11:34 AM   History and all data above reviewed.  Patient examined.  I agree with the findings as above.  The patient had a syncopal episode that was preceded by sweating and presyncope.  He is in atrial fib but has had no bradycardia with this and no associated symptoms.  He does drink a significant amount of alcohol.  However, he was actually drinking a little less yesterday.  He had been feeling somewhat poorly and thought that he had a cold.  The patient exam reveals [id number]  ,  Lungs: Clear  ,  Abd: Positive bowel sounds, no rebound no guarding, Ext No edeam  .  All available labs, radiology testing, previous records reviewed. Agree with documented assessment and plan. Syncope:  I suspect a vasovagal event or possibly orthostasis.  I do not think that this was related to the atrial fib although I could not absolutely exclude a bradyarrhythmia.  Unfortunately he did not have orthostatic BPs recorded in the ER.  I will suggest DCCV while he is here (his INR has been greater than 2 for 3 weeks.).  Also, of note I will ask Dr. Jens Som to review the previous TEE to exclude a pulmonary valve vegetation that was mentioned.  I think that this was an error.   Fayrene Fearing Deriana Vanderhoef  11:51 AM   11/22/2013

## 2013-11-22 NOTE — Progress Notes (Signed)
[facility] PROGRESS NOTE    Marc Hansen DGL:[id number] DOB: [birth year] DOA: 11/21/2013 PCP: Marc Edge, MD Primary Cardiologist: Dr. Olga Hansen.  HPI/Brief narrative 52 y.o. male suffered a partial parietal hemorrhagic stroke, felt to be cardioembolic with hemorrhagic conversion, and afib with moderate to severe MR found at that time, now on coumadin, presents with syncope after mild lightheadedness prodrome. He has a deep laceration on his left front and another smaller on the contralateral side. Head CT was negative for acute intracranial bleed, and EKG showed afib with no ischemic changes. He denied chest pain or shortness of breath. He had UTD with his tetanus shots. In the ER, he was sutured by Marc Hansen, PA_C, and hospitalist was asked to admit him for syncope work up. Marc Hansen, he was started on Pradaxa, but now on therapeutic coumadin. His lab work were unremarkable. He drinks substantial amounts of beer. He was feeling poorly for last 2-3 days and felt that he had picked some cold from his wife.   Assessment/Plan:  Syncope - DD: Vasovagal Vs Orthostatic hypotension. Less likely arrythmia, although possible. - No orthostatic BP changes today. - Cardiology consultation appreciate.  Afib - Controlled rate and anticoagulated - Cardiology considering DCCV  Alcohol & THC abuse - cessation counseled - Ativan protocol. No current withdrawal features.  Recent CVA 05/2013 with hemorrhagic transformation - On chronic Coumadin  Moderate MR by TEE 08/2013  Abnormal TEE with pulmonic vegetation - ? Error. Cardiology will reassess in am.   DVT prophylaxis: Coumadin anticoagulated Code Status: Full Family Communication: None at bedside Disposition Plan: Home when stable   Consultants:  Cardiology  Procedures:  None  Antibiotics:  None  Subjective: Denies complaints. Eager to go home.  Objective: Filed Vitals:   11/21/13 2115 11/21/13 2130  11/21/13 2158 11/22/13 0513  BP: 128/93 119/78 130/85 115/72  Pulse: 56 104 98 82  Temp:   98.2 F (36.8 C) 98.2 F (36.8 C)  TempSrc:   Oral Oral  Resp: 18 25 20 18   Height:   6\' 4"  (1.93 m)   Weight:   107.502 kg (237 lb)   SpO2: 98% 90% 96% 96%   No intake or output data in the 24 hours ending 11/22/13 0742 Filed Weights   11/21/13 2158  Weight: 107.502 kg (237 lb)     Exam:  General exam: Patient lying comfortably in bed. Sutured lacerations right forehead & left eyebrow. Respiratory system: Clear. No increased work of breathing. Cardiovascular system: S1 & S2 heard, irregularly irregular. No JVD, murmurs, gallops, clicks or pedal edema. Tele: Afib with CVR. No pauses >/= 3 secs Gastrointestinal system: Abdomen is nondistended, soft and nontender. Normal bowel sounds heard. Central nervous system: Alert and oriented. No focal neurological deficits. Extremities: Symmetric 5 x 5 power.   Data Reviewed: Basic Metabolic Panel:  Recent Labs Lab 11/21/13 1905  NA 134*  K 3.8  CL 97  CO2 22  GLUCOSE 112*  BUN 24*  CREATININE 1.22  CALCIUM 10.2   Liver Function Tests:  Recent Labs Lab 11/21/13 1905  AST 30  ALT 40  ALKPHOS 71  BILITOT 0.3  PROT 7.4  ALBUMIN 4.4   No results found for this basename: LIPASE, AMYLASE,  in the last 168 hours No results found for this basename: AMMONIA,  in the last 168 hours CBC:  Recent Labs Lab 11/21/13 1905  WBC 10.4  NEUTROABS 5.8  HGB 15.6  HCT 44.8  MCV 86.2  PLT 256  Cardiac Enzymes:  Recent Labs Lab 11/21/13 2331 11/22/13 0524  TROPONINI <0.30 <0.30   BNP (last 3 results) No results found for this basename: PROBNP,  in the last 8760 hours CBG: No results found for this basename: GLUCAP,  in the last 168 hours  No results found for this or any previous visit (from the past 240 hour(s)).    Additional labs: 1. INR: 2.27 on 12/1 2. TSH: 1.147     Studies: Ct Head Wo Contrast  11/21/2013    CLINICAL DATA:  Head trauma.  On anticoagulation.  EXAM: CT HEAD WITHOUT CONTRAST  TECHNIQUE: Contiguous axial images were obtained from the base of the skull through the vertex without intravenous contrast.  COMPARISON:  CT head 06/05/2013 and MR brain 06/06/2013  FINDINGS: There is focal encephalomalacia in the left parietal lobe consistent with prior infarction. Negative for hemorrhage, hydrocephalus, mass effect, mass lesion, or evidence of acute infarction.  Unchanged polypoid lesion in the left maxillary sinus. Mucosal thickening in the posterior right maxillary sinus and multiple ethmoid air cells. Skull is intact. Soft tissues of the scalp are are unremarkable. Tiny calcification of the posterior right globe at the optic nerve insertion is unchanged.  IMPRESSION: 1. No acute intracranial abnormality. 2. Encephalomalacia in the left parietal lobe at site of previously described infarction (see MRI June 2014).   Electronically Signed   By: Marc Hansen M.D.   On: 11/21/2013 19:36        Scheduled Meds: . albuterol  2.5 mg Nebulization Q6H  . diltiazem  180 mg Oral Daily  . docusate sodium  100 mg Oral BID  . folic acid  1 mg Oral Daily  . losartan  50 mg Oral Daily  . mometasone-formoterol  2 puff Inhalation BID  . pantoprazole  40 mg Oral Daily  . simvastatin  20 mg Oral q1800  . sodium chloride  3 mL Intravenous Q12H  . thiamine  100 mg Oral Daily   Continuous Infusions: . sodium chloride 50 mL/hr at 11/21/13 2156    Principal Problem:   Syncope and collapse Active Problems:   Tobacco abuse, in remission   Hyperlipidemia   CVA (cerebral infarction)   Atrial fibrillation   Mitral regurgitation   Laceration    Time spent: 35 minutes    Marc Lwin, MD, FACP, FHM. [facility] Pager [phone]  If 7PM-7AM, please contact night-coverage [email or web address] Password [email or web address] 11/22/2013, 7:42 AM    LOS: 1 day

## 2013-11-22 NOTE — Progress Notes (Signed)
UR completed 

## 2013-11-22 NOTE — Telephone Encounter (Signed)
Called spoke with pt's wife advised INR 2.04 still ok for DCCV. Advised to discuss possibility of getting DCCV done while currently admitted since today is 21 days therapeutic.  Pt's wife verbalizes understanding. Will forward msg to Dr Jens Som to make aware pt currently admitted s/p syncopal episode with laceration and stiches.

## 2013-11-22 NOTE — Progress Notes (Addendum)
ANTICOAGULATION CONSULT NOTE - Initial Consult  Pharmacy Consult for Warfarin Indication: atrial fibrillation  Allergies  Allergen Reactions  . Atenolol     Felt contributing to dyspnea per Dr. Ludwig Clarks office note    Patient Measurements: Height: 6\' 4"  (193 cm) Weight: 237 lb (107.502 kg) IBW/kg (Calculated) : 86.8  Vital Signs: Temp: 98.2 F (36.8 C) (12/01 0513) Temp src: Oral (12/01 0513) BP: 150/89 mmHg (12/01 0935) Pulse Rate: 87 (12/01 0935)  Labs:  Recent Labs  11/21/13 1905 11/21/13 2331 11/22/13 0524  HGB 15.6  --   --   HCT 44.8  --   --   PLT 256  --   --   LABPROT 22.4*  --   --   INR 2.04*  --   --   CREATININE 1.22  --   --   TROPONINI  --  <0.30 <0.30    Estimated Creatinine Clearance: 97.4 ml/min (by C-G formula based on Cr of 1.22).   Medical History: Past Medical History  Diagnosis Date  . Allergy     seasonal  . Hypertension   . Overweight(278.02)   . Arthritis   . GERD (gastroesophageal reflux disease)   . Vitamin D deficiency 04/17/2011  . Hand crush injury 07/05/2011  . Umbilical hernia 07/05/2011  . Hyperlipidemia   . Atrial fibrillation 06/05/13    a. Dx 05/2013 in setting of CVA. Initially on Pradaxa, changed to Coumadin due to MR.  Marland Kitchen Abnormal liver function 08/20/2012  . Medial epicondylitis of right elbow 11/09/2012  . CVA (cerebral infarction) 06/05/13    a. 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 Also new afib. Carotid dopplers 0-39% bilat IC stenosis.   . Mitral regurgitation     a. Mod by TEE 08/30/13.  Marland Kitchen COPD (chronic obstructive pulmonary disease)   . Alcohol abuse     a. 05/2013: 6pk daily EtOH, marijuana.  . Cardiomyopathy     a. EF 45-50% in 05/2013, 40-45% by TEE 08/2013.    Medications:  Prescriptions prior to admission  Medication Sig Dispense Refill  . albuterol (PROVENTIL HFA;VENTOLIN HFA) 108 (90 BASE) MCG/ACT inhaler Inhale 1-2 puffs into the lungs every 6 (six) hours as needed  for wheezing or shortness of breath.      Marland Kitchen albuterol (PROVENTIL) (2.5 MG/3ML) 0.083% nebulizer solution Take 2.5 mg by nebulization every 6 (six) hours as needed for wheezing or shortness of breath.      . diltiazem (CARDIZEM CD) 180 MG 24 hr capsule Take 180 mg by mouth daily.      . Fluticasone-Salmeterol (ADVAIR) 250-50 MCG/DOSE AEPB Inhale 1 puff into the lungs 2 (two) times daily.      . folic acid (FOLVITE) 1 MG tablet Take 1 mg by mouth daily.      Marland Kitchen HYDROcodone-acetaminophen (NORCO/VICODIN) 5-325 MG per tablet Take 1 tablet by mouth every 6 (six) hours as needed for moderate pain.      Marland Kitchen losartan (COZAAR) 50 MG tablet Take 50 mg by mouth daily.      Marland Kitchen MILK THISTLE PO Take 1 tablet by mouth daily.      . naproxen sodium (ANAPROX) 220 MG tablet Take 440 mg by mouth 2 (two) times daily as needed (for pain/swelling).      Marland Kitchen omeprazole (PRILOSEC) 40 MG capsule Take 40 mg by mouth every 3 (three) days.      . pravastatin (PRAVACHOL) 40 MG tablet Take 40 mg by mouth every evening.      Marland Kitchen  thiamine (VITAMIN B-1) 100 MG tablet Take 100 mg by mouth daily.      . traMADol (ULTRAM) 50 MG tablet Take 50-100 mg by mouth 2 (two) times daily as needed for moderate pain.      Marland Kitchen warfarin (COUMADIN) 5 MG tablet Take 7.5-10 mg by mouth daily. Takes 10mg  on Monday and Wednesday. Takes 7.5mg  on all other days        Assessment: 52 yo M on Coumadin PTA for afib, presented to ED after syncopal episode resulting in fall and facial lacerations.  CT was negative for acute intracranial bleed.  Pt has a recent hx of partial parietal hemorrhagic stroke, felt to be cardioembolic with hemorrhagic conversion, and afib now on coumadin with plans for outpt DCCV after 4 weeks of therapeutic anticoagulation.    INR at low end of therapeutic goal last PM.  Coumadin 7.5mg  daily except 10mg  on Mon and Wed.  Last dose 11/29.  With missed dose last PM, I am  concerned that INR may decrease further.  Will give increased Coumadin  dose now.  Goal of Therapy:  INR 2-3 Monitor platelets by anticoagulation protocol: Yes   Plan:  Coumadin 12.5 mg PO x 1 now. Daily INR and CBC.  [employer], Pharm.D., BCPS Clinical Pharmacist Pager [phone] 11/22/2013 10:35 AM   Addendum: Pt confessed to Cards PA Bernette Mayers) that he was so concerned that he was not ordered Coumadin 11/30 in ER that he took a 5mg  tablet from home.  This is still less than his PTA dose and INR may still fall to <2 and potentially delay DCCV.  Will continue with increased Coumadin dose as ordered.  [employer], Pharm.D., BCPS Clinical Pharmacist Pager [phone] 11/22/2013 11:31 AM

## 2013-11-23 ENCOUNTER — Encounter (HOSPITAL_COMMUNITY): Payer: BC Managed Care – PPO | Admitting: Anesthesiology

## 2013-11-23 ENCOUNTER — Observation Stay (HOSPITAL_COMMUNITY): Payer: BC Managed Care – PPO | Admitting: Anesthesiology

## 2013-11-23 ENCOUNTER — Encounter (HOSPITAL_COMMUNITY): Payer: Self-pay | Admitting: Anesthesiology

## 2013-11-23 LAB — BASIC METABOLIC PANEL
BUN: 17 mg/dL (ref 6–23)
Chloride: 103 mEq/L (ref 96–112)
Glucose, Bld: 113 mg/dL — ABNORMAL HIGH (ref 70–99)
Potassium: 4.7 mEq/L (ref 3.5–5.1)

## 2013-11-23 LAB — CBC
MCH: 31 pg (ref 26.0–34.0)
MCHC: 34.6 g/dL (ref 30.0–36.0)
MCV: 89.7 fL (ref 78.0–100.0)
Platelets: 243 10*3/uL (ref 150–400)
RBC: 5.03 MIL/uL (ref 4.22–5.81)
RDW: 13 % (ref 11.5–15.5)

## 2013-11-23 LAB — PROTIME-INR
INR: 2.37 — ABNORMAL HIGH (ref 0.00–1.49)
Prothrombin Time: 25.1 seconds — ABNORMAL HIGH (ref 11.6–15.2)

## 2013-11-23 MED ORDER — ADULT MULTIVITAMIN W/MINERALS CH: 1.0000 | ORAL_TABLET | Freq: Every day | ORAL | Status: AC

## 2013-11-23 MED ORDER — LOSARTAN POTASSIUM 50 MG PO TABS: 50.0000 mg | ORAL_TABLET | Freq: Every day | ORAL | Status: DC

## 2013-11-23 MED ORDER — WARFARIN SODIUM 7.5 MG PO TABS
7.5000 mg | ORAL_TABLET | Freq: Once | ORAL | Status: DC
  Filled 2013-11-23: qty 1

## 2013-11-23 MED ORDER — LIDOCAINE HCL (CARDIAC) 20 MG/ML IV SOLN
INTRAVENOUS | Status: DC | PRN
  Administered 2013-11-23: 60 mg via INTRAVENOUS

## 2013-11-23 MED ORDER — DILTIAZEM HCL ER COATED BEADS 120 MG PO CP24: 120.0000 mg | ORAL_CAPSULE | Freq: Every day | ORAL | Status: DC

## 2013-11-23 MED ORDER — PROPOFOL 10 MG/ML IV BOLUS
INTRAVENOUS | Status: DC | PRN
  Administered 2013-11-23: 140 mg via INTRAVENOUS

## 2013-11-23 NOTE — Anesthesia Preprocedure Evaluation (Addendum)
Anesthesia Evaluation  Patient identified by MRN, date of birth, ID band Patient awake    Reviewed: Allergy & Precautions, H&P , NPO status , Patient's Chart, lab work & pertinent test results  History of Anesthesia Complications Negative for: history of anesthetic complications  Airway Mallampati: II TM Distance: >3 FB Neck ROM: Full    Dental  (+) Teeth Intact, Partial Lower and Dental Advisory Given   Pulmonary COPDCurrent Smoker, former smoker,  breath sounds clear to auscultation        Cardiovascular hypertension, Pt. on medications + dysrhythmias Atrial Fibrillation     Neuro/Psych June 2014 CVA, No Residual Symptoms    GI/Hepatic Neg liver ROS, GERD-  Medicated and Controlled,  Endo/Other  negative endocrine ROS  Renal/GU negative Renal ROS     Musculoskeletal   Abdominal   Peds  Hematology negative hematology ROS (+)   Anesthesia Other Findings   Reproductive/Obstetrics negative OB ROS                          Anesthesia Physical Anesthesia Plan  ASA: III  Anesthesia Plan: General   Post-op Pain Management:    Induction: Intravenous  Airway Management Planned: Mask  Additional Equipment:   Intra-op Plan:   Post-operative Plan:   Informed Consent: I have reviewed the patients History and Physical, chart, labs and discussed the procedure including the risks, benefits and alternatives for the proposed anesthesia with the patient or authorized representative who has indicated his/her understanding and acceptance.     Plan Discussed with:   Anesthesia Plan Comments:         Anesthesia Quick Evaluation

## 2013-11-23 NOTE — Progress Notes (Signed)
Pt was made aware that he would not be able to drive for 6 months based on card recommendations. Pt became irate as well as wife. States he is leaving now. I told him to please wait few minutes dr was discharging him, he began crying stating because he could drive he lost his job and had to leave now. Encouraged pt to follow up with cards and to resume home meds. Pt stated he would rather die. He let me remove IV, he gathered his belongings abruptly and him and his wife stormed out. Pt did not sign AMA form. Made Dr. Waymon Amato and Dr. Jens Som aware. Emelda Brothers RN

## 2013-11-23 NOTE — Telephone Encounter (Signed)
Follow up     Pt needs a call from the DR. About his light duties for a couple week, but was told by RN at the hospital 6 mo will be when he can drive.

## 2013-11-23 NOTE — Discharge Summary (Addendum)
Physician Discharge Summary  Marc Hansen UYQ:[id number] DOB: Feb 21, 1961 DOA: 11/21/2013  PCP: Danise Edge, MD  Admit date: 11/21/2013 Discharge date: 11/23/2013  Time spent: Greater than 30 minutes  Recommendations for Outpatient Follow-up:  1. Dr. Danise Edge, PCP in 1 week. 2. Dr. Olga Millers, Cardiology in 2 weeks.  Discharge Diagnoses:  Principal Problem:   Syncope and collapse Active Problems:   Tobacco abuse, in remission   Hyperlipidemia   CVA (cerebral infarction)   Atrial fibrillation   Mitral regurgitation   Laceration   Alcohol abuse   Discharge Condition: Improved & Stable  Diet recommendation: Heart Healthy diet.  Filed Weights   11/21/13 2158  Weight: 107.502 kg (237 lb)    History of present illness:  52 y.o. male suffered a partial parietal hemorrhagic stroke, felt to be cardioembolic with hemorrhagic conversion, and afib with moderate to severe MR found at that time, now on coumadin, presents with syncope after mild lightheadedness prodrome. He has a deep laceration on his left front and another smaller on the contralateral side. Head CT was negative for acute intracranial bleed, and EKG showed afib with no ischemic changes. He denied chest pain or shortness of breath. He had UTD with his tetanus shots. In the ER, he was sutured by Ivonne Andrew, PA_C, and hospitalist was asked to admit him for syncope work up. [name], he was started on Pradaxa, but now on therapeutic coumadin. His lab work were unremarkable. He drinks substantial amounts of beer. He was feeling poorly for last 2-3 days and felt that he had picked some cold from his wife  Hospital Course:   Syncope  - Possibly vasovagal with complement of orthostasis. - No orthostatic BP changes. - Cardiology consultation and followup appreciated - Cardiology recommended no driving for 6 months. This was explained to patient and the dangers of serious and life-threatening injury to self and  others if he drives and has syncopal event leading to an MVA. He verbalized understanding but was upset and apparently left AMA without waiting for discharge paperwork. Patient's primary cardiologist was alerted by nursing.  Persistent Afib  - Controlled rate and anticoagulated  - Cardiology attempted but cardioversion was unsuccessful. As per cardiology, continue rate control and anticoagulation. Cardiology has cleared him for discharge with outpatient followup in 2 weeks.  Alcohol & THC abuse  - cessation counseled  - Ativan protocol. No current withdrawal features.   Recent CVA 05/2013 with hemorrhagic transformation  - On chronic Coumadin. Therapeutically anticoagulated  Moderate MR by TEE 08/2013   Abnormal TEE with pulmonic vegetation  - ? Error. As per cardiology   Consultations:  Cardiology  Procedures:  Attempted cardioversion    Discharge Exam:  Complaints:  Patient denied complaints this morning and was eager to go home.  Filed Vitals:   11/22/13 0935 11/22/13 1417 11/22/13 2100 11/23/13 0500  BP: 150/89 137/76 140/90 150/97  Pulse: 87 78 82 65  Temp:  98.7 F (37.1 C) 97.9 F (36.6 C) 98 F (36.7 C)  TempSrc:  Oral Oral   Resp: 20 20 18 18   Height:      Weight:      SpO2: 97% 97% 97% 100%    General exam: Patient lying comfortably in bed. Sutured lacerations right forehead & left eyebrow. Left periorbital mild ecchymosis and swelling. No subconjunctival hemorrhage. Respiratory system: Clear. No increased work of breathing.  Cardiovascular system: S1 & S2 heard, irregularly irregular. No JVD, murmurs, gallops, clicks or pedal edema. Tele: Afib  with CVR. No pauses >/= 3 secs  Gastrointestinal system: Abdomen is nondistended, soft and nontender. Normal bowel sounds heard.  Central nervous system: Alert and oriented. No focal neurological deficits.  Extremities: Symmetric 5 x 5 power.   Discharge Instructions      Discharge Orders   Future  Appointments Provider Department Dept Phone   11/30/2013 2:00 PM Lewayne Bunting, MD [facility] [place] Office [phone]   Future Orders Complete By Expires   Call MD for:  extreme fatigue  As directed    Call MD for:  persistant dizziness or light-headedness  As directed    Call MD for:  redness, tenderness, or signs of infection (pain, swelling, redness, odor or green/yellow discharge around incision site)  As directed    Call MD for:  severe uncontrolled pain  As directed    Call MD for:  As directed    Comments:     Feeling like passing out.   Diet - low sodium heart healthy  As directed    Driving Restrictions  As directed    Comments:     No driving for 6 months or until cleared by M.D.   Increase activity slowly  As directed        Medication List    STOP taking these medications       naproxen sodium 220 MG tablet  Commonly known as:  ANAPROX     traMADol 50 MG tablet  Commonly known as:  ULTRAM      TAKE these medications       albuterol (2.5 MG/3ML) 0.083% nebulizer solution  Commonly known as:  PROVENTIL  Take 2.5 mg by nebulization every 6 (six) hours as needed for wheezing or shortness of breath.     albuterol 108 (90 BASE) MCG/ACT inhaler  Commonly known as:  PROVENTIL HFA;VENTOLIN HFA  Inhale 1-2 puffs into the lungs every 6 (six) hours as needed for wheezing or shortness of breath.     diltiazem 120 MG 24 hr capsule  Commonly known as:  CARDIZEM CD  Take 1 capsule (120 mg total) by mouth daily.     Fluticasone-Salmeterol 250-50 MCG/DOSE Aepb  Commonly known as:  ADVAIR  Inhale 1 puff into the lungs 2 (two) times daily.     folic acid 1 MG tablet  Commonly known as:  FOLVITE  Take 1 mg by mouth daily.     HYDROcodone-acetaminophen 5-325 MG per tablet  Commonly known as:  NORCO/VICODIN  Take 1 tablet by mouth every 6 (six) hours as needed for moderate pain.     losartan 50 MG tablet  Commonly known as:  COZAAR  Take 1 tablet (50 mg  total) by mouth daily.     MILK THISTLE PO  Take 1 tablet by mouth daily.     multivitamin with minerals Tabs tablet  Take 1 tablet by mouth daily.     omeprazole 40 MG capsule  Commonly known as:  PRILOSEC  Take 40 mg by mouth every 3 (three) days.     pravastatin 40 MG tablet  Commonly known as:  PRAVACHOL  Take 40 mg by mouth every evening.     thiamine 100 MG tablet  Commonly known as:  VITAMIN B-1  Take 100 mg by mouth daily.     warfarin 5 MG tablet  Commonly known as:  COUMADIN  Take 7.5-10 mg by mouth daily. Takes 10mg  on Monday and Wednesday. Takes 7.5mg  on all other days  Follow-up Information   Follow up with Danise Edge, MD. Schedule an appointment as soon as possible for a visit in 1 week.   Specialty:  Family Medicine   Contact information:   [address] [place] [address] [phone]       Follow up with Olga Millers, MD. Schedule an appointment as soon as possible for a visit in 2 weeks.   Specialty:  Cardiology   Contact information:   [address] [place] [address] [phone]        The results of significant diagnostics from this hospitalization (including imaging, microbiology, ancillary and laboratory) are listed below for reference.    Significant Diagnostic Studies: Ct Head Wo Contrast  11/21/2013   CLINICAL DATA:  Head trauma.  On anticoagulation.  EXAM: CT HEAD WITHOUT CONTRAST  TECHNIQUE: Contiguous axial images were obtained from the base of the skull through the vertex without intravenous contrast.  COMPARISON:  CT head 06/05/2013 and MR brain 06/06/2013  FINDINGS: There is focal encephalomalacia in the left parietal lobe consistent with prior infarction. Negative for hemorrhage, hydrocephalus, mass effect, mass lesion, or evidence of acute infarction.  Unchanged polypoid lesion in the left maxillary sinus. Mucosal thickening in the posterior right maxillary sinus and multiple ethmoid  air cells. Skull is intact. Soft tissues of the scalp are are unremarkable. Tiny calcification of the posterior right globe at the optic nerve insertion is unchanged.  IMPRESSION: 1. No acute intracranial abnormality. 2. Encephalomalacia in the left parietal lobe at site of previously described infarction (see MRI June 2014).   Electronically Signed   By: Britta Mccreedy M.D.   On: 11/21/2013 19:36    Microbiology: No results found for this or any previous visit (from the past 240 hour(s)).   Labs: Basic Metabolic Panel:  Recent Labs Lab 11/21/13 1905 11/23/13 0447  NA 134* 140  K 3.8 4.7  CL 97 103  CO2 22 26  GLUCOSE 112* 113*  BUN 24* 17  CREATININE 1.22 1.04  CALCIUM 10.2 9.6   Liver Function Tests:  Recent Labs Lab 11/21/13 1905  AST 30  ALT 40  ALKPHOS 71  BILITOT 0.3  PROT 7.4  ALBUMIN 4.4   No results found for this basename: LIPASE, AMYLASE,  in the last 168 hours No results found for this basename: AMMONIA,  in the last 168 hours CBC:  Recent Labs Lab 11/21/13 1905 11/23/13 0447  WBC 10.4 9.6  NEUTROABS 5.8  --   HGB 15.6 15.6  HCT 44.8 45.1  MCV 86.2 89.7  PLT 256 243   Cardiac Enzymes:  Recent Labs Lab 11/21/13 2331 11/22/13 0524 11/22/13 1105  TROPONINI <0.30 <0.30 <0.30   BNP: BNP (last 3 results) No results found for this basename: PROBNP,  in the last 8760 hours CBG: No results found for this basename: GLUCAP,  in the last 168 hours  Additional labs: 1. INR 11/23/13:2.37 2. TSH: 1.147 3. Carotid Dopplers 06/07/13: Summary: Bilateral - Less than 40% stenosis of the left common carotid artery. There is 0 to 39% ICA stenosis bilaterally. Vertebral artery flow is antegrade bilaterally.    Signed:  Marcellus Scott, MD, FACP, FHM. [facility] Pager [phone]  If 7PM-7AM, please contact night-coverage [email or web address] Password [facility] 11/23/2013, 1:57 PM

## 2013-11-23 NOTE — Preoperative (Signed)
Beta Blockers   Reason not to administer Beta Blockers:Not Applicable 

## 2013-11-23 NOTE — CV Procedure (Addendum)
EP procedure Note   Pre procedure Diagnosis:  Persistent Atrial fibrillation Post procedure Diagnosis:  Same  Procedures:  Electrical cardioversion  Description:  Informed, written consent was obtained for cardioversion.  Adequate IV acces and airway support were assured.  The patient was adequately sedated with intravenous propofol as outlined in the anesthesia report.  The patient presented today in atrial fibrillation.  He was unsuccessfully cardioverted three separate synchronized biphasic 200J shock delivered with cardioversion electrodes placed in the anterior/posterior configuration.  There were no early apparent complications.  Conclusions:  1.  Unsuccessful cardioversion  2.  No early apparent complications.   I have spoken with Dr Jens Som who recommends rate control and anticoagulation. OK to discharge today with follow-up with Dr Jens Som in 2 weeks No driving x 6 months given syncope this admission  Fayrene Fearing Ronette Hank,MD 11:31 AM 11/23/2013

## 2013-11-23 NOTE — Transfer of Care (Signed)
Immediate Anesthesia Transfer of Care Note  Patient: Marc Hansen  Procedure(s) Performed: * No procedures listed *  Patient Location: Nursing Unit  Anesthesia Type:General  Level of Consciousness: awake, alert , oriented and patient cooperative  Airway & Oxygen Therapy: Patient Spontanous Breathing and Patient connected to nasal cannula oxygen  Post-op Assessment: Report given to PACU RN and Post -op Vital signs reviewed and stable  Post vital signs: Reviewed  Complications: No apparent anesthesia complications

## 2013-11-23 NOTE — Progress Notes (Signed)
ANTICOAGULATION CONSULT NOTE - Follow-up Consult  Pharmacy Consult for Warfarin Indication: atrial fibrillation  Allergies  Allergen Reactions  . Atenolol     Felt contributing to dyspnea per Dr. Ludwig Clarks office note    Patient Measurements: Height: 6\' 4"  (193 cm) Weight: 237 lb (107.502 kg) IBW/kg (Calculated) : 86.8  Vital Signs: Temp: 98 F (36.7 C) (12/02 0500) BP: 150/97 mmHg (12/02 0500) Pulse Rate: 65 (12/02 0500)  Labs:  Recent Labs  11/21/13 1905 11/21/13 2331 11/22/13 0524 11/22/13 1100 11/22/13 1105 11/23/13 0447  HGB 15.6  --   --   --   --  15.6  HCT 44.8  --   --   --   --  45.1  PLT 256  --   --   --   --  243  LABPROT 22.4*  --   --  24.3*  --  25.1*  INR 2.04*  --   --  2.27*  --  2.37*  CREATININE 1.22  --   --   --   --  1.04  TROPONINI  --  <0.30 <0.30  --  <0.30  --     Estimated Creatinine Clearance: 114.3 ml/min (by C-G formula based on Cr of 1.04).   Home Coumadin dose 7.5mg  daily except 10mg  Mon/Wed   Assessment: 52 yo M on Coumadin PTA for afib, presented to ED after syncopal episode resulting in fall and facial lacerations.  CT was negative for acute intracranial bleed.  Pt has a recent hx of partial parietal hemorrhagic stroke, felt to be cardioembolic with hemorrhagic conversion, and afib now on coumadin.  Attempted DCCV x 3 today - unsuccessful cardioversion.  Plan rate control and anticoagulation.  INR therapeutic - restart home dose.  Goal of Therapy:  INR 2-3 Monitor platelets by anticoagulation protocol: Yes   Plan:  Coumadin 7.5mg  PO x 1 tonight.   Daily INR and CBC.  [employer], Pharm.D., BCPS Clinical Pharmacist Pager [phone] 11/23/2013 1:38 PM

## 2013-11-23 NOTE — Progress Notes (Signed)
    Subjective:  Denies CP or dyspnea   Objective:  Filed Vitals:   11/22/13 0935 11/22/13 1417 11/22/13 2100 11/23/13 0500  BP: 150/89 137/76 140/90 150/97  Pulse: 87 78 82 65  Temp:  98.7 F (37.1 C) 97.9 F (36.6 C) 98 F (36.7 C)  TempSrc:  Oral Oral   Resp: 20 20 18 18   Height:      Weight:      SpO2: 97% 97% 97% 100%    Intake/Output from previous day:  Intake/Output Summary (Last 24 hours) at 11/23/13 0657 Last data filed at 11/23/13 0600  Gross per 24 hour  Intake 2323.33 ml  Output      0 ml  Net 2323.33 ml    Physical Exam: Physical exam: Well-developed well-nourished in no acute distress.  Skin is warm and dry.  HEENT is normal.  Neck is supple. No thyromegaly.  Chest is clear to auscultation with normal expansion.  Cardiovascular exam is regular rate and rhythm.  Abdominal exam nontender or distended. No masses palpated. Extremities show no edema. neuro grossly intact    Lab Results: Basic Metabolic Panel:  Recent Labs  40/98/11 1905 11/23/13 0447  NA 134* 140  K 3.8 4.7  CL 97 103  CO2 22 26  GLUCOSE 112* 113*  BUN 24* 17  CREATININE 1.22 1.04  CALCIUM 10.2 9.6   CBC:  Recent Labs  11/21/13 1905 11/23/13 0447  WBC 10.4 9.6  NEUTROABS 5.8  --   HGB 15.6 15.6  HCT 44.8 45.1  MCV 86.2 89.7  PLT 256 243   Cardiac Enzymes:  Recent Labs  11/21/13 2331 11/22/13 0524 11/22/13 1105  TROPONINI <0.30 <0.30 <0.30     Assessment/Plan:  1. Syncope - possible vasovagal with component of orthostasis; continue hydration. 2. Persistent atrial fibrillation -INR has been therapeutic for 21 days; proceed with DCCV today; hopefully he will hold sinus. Patient bradycardic in early AM hours; will hold cardizem prior to DCCV and then decrease dose to 120 mg daily. 3. Parietal CVA 05/2013 with hemorrhagic transformation, on chronic Coumadin  4. Moderate MR by TEE 08/2013 - will most likely repeat echo in March; may need MV surgery in the  future. 5. LV dysfunction EF 40-45%  6. Alcohol abuse, marijuana abuse, educated on importance of cessation  7. HTN     Marc Hansen 11/23/2013, 6:57 AM

## 2013-11-23 NOTE — Telephone Encounter (Signed)
Returned phone call to patient.  He is requesting clarification re: driving restrictions. Informed him dc instructions state no driving for 6 months or until cleared by md. Also informed him that if there is unexplained syncopal episode, in [place] a person is not permitted to drive for 6 months. Pt made appointment already with Dr. Jens Som for 12/9. Advised him to discuss this issue with Dr. Jens Som at that time.

## 2013-11-23 NOTE — Anesthesia Postprocedure Evaluation (Signed)
  Anesthesia Post-op Note  Patient: Marc Hansen  Procedure(s) Performed: * No procedures listed *  Patient Location: Nursing Unit  Anesthesia Type:General  Level of Consciousness: awake, alert , oriented and patient cooperative  Airway and Oxygen Therapy: Patient Spontanous Breathing and Patient connected to nasal cannula oxygen  Post-op Pain: none  Post-op Assessment: Post-op Vital signs reviewed, Patient's Cardiovascular Status Stable, Respiratory Function Stable, Patent Airway and No signs of Nausea or vomiting  Post-op Vital Signs: Reviewed  Complications: No apparent anesthesia complications

## 2013-11-29 ENCOUNTER — Encounter: Payer: Self-pay | Admitting: Family

## 2013-11-29 ENCOUNTER — Ambulatory Visit (INDEPENDENT_AMBULATORY_CARE_PROVIDER_SITE_OTHER): Payer: BC Managed Care – PPO | Admitting: Family

## 2013-11-29 VITALS — BP 130/80 | HR 64 | Temp 98.0°F | Resp 16 | Ht 76.0 in | Wt 235.1 lb

## 2013-11-29 DIAGNOSIS — R55 Syncope and collapse: Secondary | ICD-10-CM

## 2013-11-29 DIAGNOSIS — T148XXA Other injury of unspecified body region, initial encounter: Secondary | ICD-10-CM

## 2013-11-29 DIAGNOSIS — IMO0002 Reserved for concepts with insufficient information to code with codable children: Secondary | ICD-10-CM

## 2013-11-29 NOTE — Patient Instructions (Signed)
Please keep your upcoming appointment with Cardiology. Follow up with Dr. Abner Greenspan in 3 months.

## 2013-11-29 NOTE — Assessment & Plan Note (Signed)
Advised pt that I can clear him to return to work but that I cannot clear him to drive. He will need to discuss this further with cardiology.  He has an appointment tomorrow with cardiology.

## 2013-11-29 NOTE — Assessment & Plan Note (Signed)
Healing well. Sutures removed without difficulty. Pt tolerated procedure well.

## 2013-11-29 NOTE — Progress Notes (Signed)
Subjective:    Patient ID: Marc Hansen, male    DOB: April 12, 1961, 52 y.o.   MRN: [id number]  HPI  Marc Hansen is a 52 yr old male who presents today for hospital follow up. Hospital records are reviewed. Pt was admitted 11/21/13 following a syncopal episode.  He was discharged on 12/2.  He suffered a deep laceration on the left front and another smaller laceration non the contralateral side.  Had a CT of his head which was negative for acute intracranial bleed.  Cardiology was consulted and recommended no driving x 6 months.  He has hx of alcohol abuse and apparently had no withdrawal symptoms. A cardioversion was attempted during his hospitalization, but was unsuccessful. He is continued on coumadin. INR was therapeutic at time of discharge.    He reports that he has had no further dizziness of syncopal episodes since his discharge. He has follow up with cardiology and coumadin clinic tomorrow.  He would like to have his sutures removed.   He is requesting clearance note to return to work. 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 Seasonal allergies   . Hypertension   . Overweight(278.02)   . Arthritis   . GERD (gastroesophageal reflux disease)   . Vitamin D deficiency 04/17/2011  . Hand crush injury 07/05/2011  . Umbilical hernia 07/05/2011  . Hyperlipidemia   . Atrial fibrillation 06/05/13    a. Dx 05/2013 in setting of CVA. Initially on Pradaxa, changed to Coumadin due to MR.  Marland Kitchen Abnormal liver function 08/20/2012  . Medial epicondylitis of right elbow 11/09/2012  . CVA (cerebral infarction) 06/05/13    a. 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 Also new afib. Carotid dopplers 0-39% bilat IC stenosis.   . Mitral regurgitation     a. Mod by TEE 08/30/13.  Marland Kitchen COPD (chronic obstructive pulmonary disease)   . Alcohol abuse   . Cardiomyopathy     a. EF 45-50% in 05/2013,  40-45% by TEE 08/2013.    History   Social History  . Marital Status: Married    Spouse Name: N/A    Number of Children: 3  . Years of Education: N/A   Occupational History  .     Social History Main Topics  . Smoking status: Former Smoker -- 0.25 packs/day for 26 years    Types: Cigarettes  . Smokeless tobacco: Never Used     Comment: uses a vap every now and then  . Alcohol Use: 29.4 oz/week    49 Cans of beer per week     Comment: 6-7 beers/daily, plus added cocktails on the weekend  . Drug Use: Yes     Comment: marijuana occasionally  . Sexual Activity: Yes    Partners: Female   Other Topics Concern  . Not on file   Social History Narrative   Does work with Secondary school teacher. Frequent travel.     Past Surgical History  Procedure Laterality Date  . Vasectomy    . Carpal tunnel release      right, needed some nerve repair as well  . Right knee arthroscopy    . Tee without cardioversion N/A 08/30/2013    Procedure: TRANSESOPHAGEAL ECHOCARDIOGRAM (TEE);  Surgeon: Lewayne Bunting, MD;  Location: [facility] ENDOSCOPY;  Service: Cardiovascular;  Laterality: N/A;    Family History  Problem Relation Age  of Onset  . Cancer Mother 11    breast- remission  . Fibromyalgia Mother   . Other Father     CHF- stent   . Heart attack Father   . Heart disease Maternal Uncle   . Heart disease Maternal Grandmother   . Heart disease Maternal Grandfather     Allergies  Allergen Reactions  . Atenolol     Felt contributing to dyspnea per Dr. Ludwig Clarks office note    Current Outpatient Prescriptions on File Prior to Visit  Medication Sig Dispense Refill  . albuterol (PROVENTIL HFA;VENTOLIN HFA) 108 (90 BASE) MCG/ACT inhaler Inhale 1-2 puffs into the lungs every 6 (six) hours as needed for wheezing or shortness of breath.      Marland Kitchen albuterol (PROVENTIL) (2.5 MG/3ML) 0.083% nebulizer solution Take 2.5 mg by nebulization every 6 (six) hours as needed for wheezing or shortness of  breath.      . diltiazem (CARDIZEM CD) 120 MG 24 hr capsule Take 1 capsule (120 mg total) by mouth daily.  30 capsule  0  . Fluticasone-Salmeterol (ADVAIR) 250-50 MCG/DOSE AEPB Inhale 1 puff into the lungs 2 (two) times daily.      . folic acid (FOLVITE) 1 MG tablet Take 1 mg by mouth daily.      Marland Kitchen HYDROcodone-acetaminophen (NORCO/VICODIN) 5-325 MG per tablet Take 1 tablet by mouth every 6 (six) hours as needed for moderate pain.      Marland Kitchen losartan (COZAAR) 50 MG tablet Take 1 tablet (50 mg total) by mouth daily.  30 tablet  0  . MILK THISTLE PO Take 1 tablet by mouth daily.      . Multiple Vitamin (MULTIVITAMIN WITH MINERALS) TABS tablet Take 1 tablet by mouth daily.      Marland Kitchen omeprazole (PRILOSEC) 40 MG capsule Take 40 mg by mouth every 3 (three) days.      . pravastatin (PRAVACHOL) 40 MG tablet Take 40 mg by mouth every evening.      . thiamine (VITAMIN B-1) 100 MG tablet Take 100 mg by mouth daily.      Marland Kitchen warfarin (COUMADIN) 5 MG tablet Take 7.5-10 mg by mouth daily. Takes 10mg  on Monday and Wednesday. Takes 7.5mg  on all other days      . [DISCONTINUED] esomeprazole (NEXIUM) 40 MG capsule Take 1 capsule (40 mg total) by mouth daily.  30 capsule  5  . [DISCONTINUED] Fluticasone Propionate, Inhal, (FLOVENT DISKUS) 100 MCG/BLIST AEPB Prn   60 each  1  . [DISCONTINUED] loratadine (CLARITIN) 10 MG tablet Take 1 tablet (10 mg total) by mouth daily.  30 tablet  2   No current facility-administered medications on file prior to visit.    BP 130/80  Pulse 64  Temp(Src) 98 F (36.7 C) (Oral)  Resp 16  Ht 6\' 4"  (1.93 m)  Wt 235 lb 1.9 oz (106.65 kg)  BMI 28.63 kg/m2    Objective:   Physical Exam  Constitutional: He appears well-developed and well-nourished. No distress.  HENT:  Head: Normocephalic and atraumatic.  Cardiovascular: Normal rate and regular rhythm.   No murmur heard. Pulmonary/Chest: Effort normal and breath sounds normal. No respiratory distress. He has no wheezes. He has no  rales. He exhibits no tenderness.  Skin: Skin is warm and dry.     Sutures are intact, well healed lacerations are noted  Psychiatric: He has a normal mood and affect. His behavior is normal. Thought content normal.          Assessment &  Plan:

## 2013-11-30 ENCOUNTER — Ambulatory Visit (INDEPENDENT_AMBULATORY_CARE_PROVIDER_SITE_OTHER): Payer: BC Managed Care – PPO | Admitting: Cardiology

## 2013-11-30 ENCOUNTER — Encounter: Payer: Self-pay | Admitting: Cardiology

## 2013-11-30 ENCOUNTER — Ambulatory Visit (INDEPENDENT_AMBULATORY_CARE_PROVIDER_SITE_OTHER): Payer: BC Managed Care – PPO | Admitting: *Deleted

## 2013-11-30 ENCOUNTER — Encounter: Payer: Self-pay | Admitting: *Deleted

## 2013-11-30 VITALS — BP 128/66 | HR 78 | Ht 76.0 in | Wt 240.0 lb

## 2013-11-30 DIAGNOSIS — I340 Nonrheumatic mitral (valve) insufficiency: Secondary | ICD-10-CM

## 2013-11-30 DIAGNOSIS — R55 Syncope and collapse: Secondary | ICD-10-CM

## 2013-11-30 DIAGNOSIS — I10 Essential (primary) hypertension: Secondary | ICD-10-CM

## 2013-11-30 DIAGNOSIS — I420 Dilated cardiomyopathy: Secondary | ICD-10-CM | POA: Insufficient documentation

## 2013-11-30 DIAGNOSIS — I4891 Unspecified atrial fibrillation: Secondary | ICD-10-CM

## 2013-11-30 DIAGNOSIS — Z7901 Long term (current) use of anticoagulants: Secondary | ICD-10-CM

## 2013-11-30 DIAGNOSIS — I428 Other cardiomyopathies: Secondary | ICD-10-CM

## 2013-11-30 DIAGNOSIS — I429 Cardiomyopathy, unspecified: Secondary | ICD-10-CM

## 2013-11-30 DIAGNOSIS — Z87891 Personal history of nicotine dependence: Secondary | ICD-10-CM

## 2013-11-30 DIAGNOSIS — I059 Rheumatic mitral valve disease, unspecified: Secondary | ICD-10-CM

## 2013-11-30 DIAGNOSIS — F17201 Nicotine dependence, unspecified, in remission: Secondary | ICD-10-CM

## 2013-11-30 NOTE — Assessment & Plan Note (Signed)
Patient counseled on discontinuing. 

## 2013-11-30 NOTE — Progress Notes (Signed)
HPI: FU atrial fibrillation. Patient admitted to [facility] in June of 2014 with a CVA. Echocardiogram in June 2014 showed an ejection fraction of 45-50%, moderate left atrial enlargement and moderate to severe mitral regurgitation. Carotid Dopplers in June 2014 showed less than 40% stenosis on the left and 0-39% stenosis on the right. 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 TSH 2.08. Holter monitor in July of 2014 showed atrial fibrillation with elevated heart rates during the day and some bradycardia at night. Atenolol added. Transesophageal echocardiogram in September of 2014 showed an ejection fraction of 40-45%, moderate left atrial enlargement, no thrombus in the atrial appendage or cavity and moderate mitral regurgitation. Patient was admitted with syncope in November of 2014. Patient had consumed alcohol and had recent viral illness. His syncope was felt secondary to vasovagal episode with possible contribution from orthostasis. The patient had been therapeutic on his Coumadin and therefore he underwent cardioversion. He did not hold sinus rhythm. It was recommended that he not drive and he signed out AGAINST MEDICAL ADVICE. Since that time he denies dyspnea, chest pain, palpitations, recurrent syncope or pedal edema.   Current Outpatient Prescriptions  Medication Sig Dispense Refill  . albuterol (PROVENTIL HFA;VENTOLIN HFA) 108 (90 BASE) MCG/ACT inhaler Inhale 1-2 puffs into the lungs every 6 (six) hours as needed for wheezing or shortness of breath.      Marland Kitchen albuterol (PROVENTIL) (2.5 MG/3ML) 0.083% nebulizer solution Take 2.5 mg by nebulization every 6 (six) hours as needed for wheezing or shortness of breath.      . diltiazem (DILACOR XR) 180 MG 24 hr capsule Take 180 mg by mouth daily.      . Fluticasone-Salmeterol (ADVAIR) 250-50 MCG/DOSE AEPB Inhale 1 puff into the lungs 2 (two) times daily.      . folic acid (FOLVITE) 1 MG tablet  Take 1 mg by mouth daily.      Marland Kitchen HYDROcodone-acetaminophen (NORCO/VICODIN) 5-325 MG per tablet Take 1 tablet by mouth every 6 (six) hours as needed for moderate pain.      Marland Kitchen losartan (COZAAR) 50 MG tablet Take 1 tablet (50 mg total) by mouth daily.  30 tablet  0  . MILK THISTLE PO Take 1 tablet by mouth daily.      . Multiple Vitamin (MULTIVITAMIN WITH MINERALS) TABS tablet Take 1 tablet by mouth daily.      Marland Kitchen omeprazole (PRILOSEC) 40 MG capsule Take 40 mg by mouth every 3 (three) days.      . pravastatin (PRAVACHOL) 40 MG tablet Take 40 mg by mouth every evening.      . thiamine (VITAMIN B-1) 100 MG tablet Take 100 mg by mouth daily.      Marland Kitchen warfarin (COUMADIN) 5 MG tablet Take 7.5-10 mg by mouth daily. Takes 10mg  on Monday and Wednesday. Takes 7.5mg  on all other days      . [DISCONTINUED] esomeprazole (NEXIUM) 40 MG capsule Take 1 capsule (40 mg total) by mouth daily.  30 capsule  5  . [DISCONTINUED] Fluticasone Propionate, Inhal, (FLOVENT DISKUS) 100 MCG/BLIST AEPB Prn   60 each  1  . [DISCONTINUED] loratadine (CLARITIN) 10 MG tablet Take 1 tablet (10 mg total) by mouth daily.  30 tablet  2   No current facility-administered medications for this visit.     Past Medical History  Diagnosis Date  . Seasonal allergies   . Hypertension   . Overweight(278.02)   . Arthritis   .  GERD (gastroesophageal reflux disease)   . Vitamin D deficiency 04/17/2011  . Hand crush injury 07/05/2011  . Umbilical hernia 07/05/2011  . Hyperlipidemia   . Atrial fibrillation 06/05/13    a. Dx 05/2013 in setting of CVA. Initially on Pradaxa, changed to Coumadin due to MR.  Marland Kitchen Abnormal liver function 08/20/2012  . Medial epicondylitis of right elbow 11/09/2012  . CVA (cerebral infarction) 06/05/13    a. 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 Also new afib. Carotid dopplers 0-39% bilat IC stenosis.   . Mitral regurgitation     a. Mod by TEE 08/30/13.  Marland Kitchen COPD (chronic obstructive  pulmonary disease)   . Alcohol abuse   . Cardiomyopathy     a. EF 45-50% in 05/2013, 40-45% by TEE 08/2013.    Past Surgical History  Procedure Laterality Date  . Vasectomy    . Carpal tunnel release      right, needed some nerve repair as well  . Right knee arthroscopy    . Tee without cardioversion N/A 08/30/2013    Procedure: TRANSESOPHAGEAL ECHOCARDIOGRAM (TEE);  Surgeon: Lewayne Bunting, MD;  Location: [facility] ENDOSCOPY;  Service: Cardiovascular;  Laterality: N/A;    History   Social History  . Marital Status: Married    Spouse Name: N/A    Number of Children: 3  . Years of Education: N/A   Occupational History  .     Social History Main Topics  . Smoking status: Former Smoker -- 0.25 packs/day for 26 years    Types: Cigarettes  . Smokeless tobacco: Never Used     Comment: uses a vap every now and then  . Alcohol Use: 29.4 oz/week    49 Cans of beer per week     Comment: 6-7 beers/daily, plus added cocktails on the weekend  . Drug Use: Yes     Comment: marijuana occasionally  . Sexual Activity: Yes    Partners: Female   Other Topics Concern  . Not on file   Social History Narrative   Does work with Secondary school teacher. Frequent travel.     ROS: no fevers or chills, productive cough, hemoptysis, dysphasia, odynophagia, melena, hematochezia, dysuria, hematuria, rash, seizure activity, orthopnea, PND, pedal edema, claudication. Remaining systems are negative.  Physical Exam: Well-developed well-nourished in no acute distress.  Skin is warm and dry.  HEENT is normal. Well healed lacerations forehead left eye Neck is supple.  Chest is clear to auscultation with normal expansion.  Cardiovascular exam is irregular Abdominal exam nontender or distended. No masses palpated. Extremities show no edema. neuro grossly intact

## 2013-11-30 NOTE — Assessment & Plan Note (Signed)
Blood pressure controlled. Continue present medications. 

## 2013-11-30 NOTE — Assessment & Plan Note (Signed)
Appears to be moderate on transesophageal echocardiogram. He will need followup echoes in the future; as outlined plan nuclear study to screen for coronary disease. If he is found to have coronary disease and best option would most likely be coronary artery bypass graft, mitral valve repair and maze procedure. He has now recovered from his previous CVA which occurred in June 2014.

## 2013-11-30 NOTE — Assessment & Plan Note (Signed)
LV function mildly reduced on previous echo. Plan nuclear study to screen for coronary disease as a cause. If abnormal he would most likely require cardiac catheterization with consideration of coronary artery bypass and graft and mitral valve repair and coronary disease demonstrated.

## 2013-11-30 NOTE — Patient Instructions (Signed)
Your physician recommends that you schedule a follow-up appointment in: 3 MONTHS WITH DR Jens Som  Your physician has requested that you have en exercise stress myoview. For further information please visit [email or web address] Please follow instruction sheet, as given.

## 2013-11-30 NOTE — Assessment & Plan Note (Signed)
No recurrent episodes. Previous episode felt possibly secondary to vagal etiology with contribution from orthostasis. Patient has been instructed not to drive for 6 months.

## 2013-11-30 NOTE — Assessment & Plan Note (Signed)
Patient remains in atrial fibrillation. Cardioversion was unsuccessful. Plan rate control and anticoagulation as he is relatively asymptomatic. Continue Cardizem. Continue Coumadin.

## 2013-12-02 ENCOUNTER — Telehealth: Payer: Self-pay | Admitting: Cardiology

## 2013-12-02 ENCOUNTER — Other Ambulatory Visit: Payer: Self-pay

## 2013-12-02 MED ORDER — DILTIAZEM HCL ER 180 MG PO CP24: 180.0000 mg | ORAL_CAPSULE | Freq: Every day | ORAL | Status: DC

## 2013-12-02 NOTE — Telephone Encounter (Signed)
Pt left a message stating she needs his Diltiazem refilled.  Pt is requesting 90 day

## 2013-12-02 NOTE — Telephone Encounter (Signed)
Scheduled nuclear for 12/13/2013,  called pt and he advised appointment day and time - pt advised he does not know if he is going to have the test or not, he will call if he decided to have the test, he cant drive so he cant work pt stated he was getting ready to lose his job and he want have insurance, and he doesn't have the money to pay for the test.  He will call us if he wants to have the test.

## 2013-12-10 ENCOUNTER — Ambulatory Visit (INDEPENDENT_AMBULATORY_CARE_PROVIDER_SITE_OTHER): Payer: BC Managed Care – PPO | Admitting: *Deleted

## 2013-12-10 DIAGNOSIS — Z7901 Long term (current) use of anticoagulants: Secondary | ICD-10-CM

## 2013-12-10 DIAGNOSIS — I4891 Unspecified atrial fibrillation: Secondary | ICD-10-CM

## 2013-12-10 LAB — POCT INR: INR: 2.2

## 2013-12-13 ENCOUNTER — Encounter (HOSPITAL_COMMUNITY): Payer: BC Managed Care – PPO

## 2013-12-24 ENCOUNTER — Telehealth: Payer: Self-pay | Admitting: Family Medicine

## 2013-12-24 NOTE — Telephone Encounter (Signed)
Patient called in requesting a new rx of hydrocodone and tramadel. He states that he needs rx by this afternoon as he will be out of town next week. I explained to him that Dr. Charlett Blake is out of the office until next week and it will be up to [facility] if he will fill these. I told him that we would call him when rx is ready for pickup.

## 2013-12-24 NOTE — Telephone Encounter (Signed)
Rx[s] requested have unclear D/C in EMR; patient informed that we will not be able to fill his Rx request, understood & will attempt to get short term supply from MD at Cardiology office until his PCP returns/SLS

## 2013-12-29 ENCOUNTER — Telehealth: Payer: Self-pay | Admitting: Family Medicine

## 2013-12-29 NOTE — Telephone Encounter (Signed)
PATIENT IS REQUESTING RX FOR HIS PAIN MEDS

## 2013-12-30 NOTE — Telephone Encounter (Signed)
Please advise refill? It looks like this was given at the hospital?

## 2013-12-30 NOTE — Telephone Encounter (Signed)
Have filled in past can have refill on Norco, same sig, #60, 1 refill but warn him has to be seen at least every 6 months to maintain this med

## 2013-12-31 ENCOUNTER — Telehealth: Payer: Self-pay

## 2013-12-31 MED ORDER — HYDROCODONE-ACETAMINOPHEN 5-325 MG PO TABS
1.0000 | ORAL_TABLET | Freq: Four times a day (QID) | ORAL | Status: DC | PRN
Start: 2013-12-31 — End: 2014-02-25

## 2013-12-31 NOTE — Telephone Encounter (Signed)
Patient came in to pick up his hydrocodone RX and told Marj he was supposed to get his Tramadol RX also?  I don't see any note were he requested this and on the med list it states to stop Tramadol on 11-23-13 when discharged?  Please advise refill request?

## 2013-12-31 NOTE — Telephone Encounter (Signed)
Informed patient of what Dr. Charlett Blake states and he will pick up sometime this afternoon

## 2013-12-31 NOTE — Telephone Encounter (Signed)
RX printed but we can't do refills due to being a controlled substance

## 2014-01-02 NOTE — Telephone Encounter (Signed)
Reviewed his chart due to his recent syncope and hopsitalization do not feel comfortable letting him have both pain meds without seeing him, checking his vitals and discussing risk and benefit

## 2014-01-03 ENCOUNTER — Ambulatory Visit (INDEPENDENT_AMBULATORY_CARE_PROVIDER_SITE_OTHER): Payer: BC Managed Care – PPO | Admitting: *Deleted

## 2014-01-03 DIAGNOSIS — Z7901 Long term (current) use of anticoagulants: Secondary | ICD-10-CM

## 2014-01-03 DIAGNOSIS — I4891 Unspecified atrial fibrillation: Secondary | ICD-10-CM

## 2014-01-03 LAB — POCT INR: INR: 2.3

## 2014-01-03 MED ORDER — WARFARIN SODIUM 5 MG PO TABS: ORAL_TABLET | ORAL | Status: DC

## 2014-01-04 NOTE — Telephone Encounter (Signed)
I spoke to patients wife and she states that pt wanted the Tramadol to begin with. She stated that pt doesn't like taking the Hydrocodone due to it making him feel "funny headed" and he climbs ladders all day. I informed pts spouse that he could bring [place] the Vicodin in to dispose and we could right the RX for the Tramadol. Pts spouse stated that pt is out of town.  Pts spouse stated that pt told her that he walked back in with his Vicodin RX and told Marj that he didn't want this RX he wanted the Tramadol?  I apologized several times for the confusion

## 2014-01-11 ENCOUNTER — Telehealth: Payer: Self-pay | Admitting: Family Medicine

## 2014-01-11 MED ORDER — ALBUTEROL SULFATE HFA 108 (90 BASE) MCG/ACT IN AERS: 1.0000 | INHALATION_SPRAY | Freq: Four times a day (QID) | RESPIRATORY_TRACT | Status: DC | PRN

## 2014-01-11 NOTE — Telephone Encounter (Signed)
Refill- ventolin hfa 20mcg/inh aer gm #18. Inhale two puffs into the lungs as needed. Qty 18 last fill 11.14.14

## 2014-01-11 NOTE — Telephone Encounter (Signed)
Refill sent.  Pt was due for follow up this month.  Please call pt to arrange appt in the 1-2 months.

## 2014-01-18 ENCOUNTER — Telehealth: Payer: Self-pay | Admitting: Family Medicine

## 2014-01-18 MED ORDER — PRAVASTATIN SODIUM 40 MG PO TABS: 40.0000 mg | ORAL_TABLET | Freq: Every evening | ORAL | Status: DC

## 2014-01-18 NOTE — Telephone Encounter (Signed)
new rx for pravastatin  Pravastatin 40mg 

## 2014-01-18 NOTE — Telephone Encounter (Signed)
RX sent

## 2014-01-31 ENCOUNTER — Ambulatory Visit (INDEPENDENT_AMBULATORY_CARE_PROVIDER_SITE_OTHER): Payer: BC Managed Care – PPO | Admitting: *Deleted

## 2014-01-31 DIAGNOSIS — Z7901 Long term (current) use of anticoagulants: Secondary | ICD-10-CM

## 2014-01-31 DIAGNOSIS — Z5181 Encounter for therapeutic drug level monitoring: Secondary | ICD-10-CM | POA: Insufficient documentation

## 2014-01-31 DIAGNOSIS — I4891 Unspecified atrial fibrillation: Secondary | ICD-10-CM

## 2014-01-31 LAB — POCT INR: INR: 3

## 2014-02-21 ENCOUNTER — Telehealth: Payer: Self-pay | Admitting: Family Medicine

## 2014-02-21 ENCOUNTER — Telehealth: Payer: Self-pay

## 2014-02-21 ENCOUNTER — Other Ambulatory Visit: Payer: Self-pay | Admitting: Family Medicine

## 2014-02-21 MED ORDER — PRAVASTATIN SODIUM 40 MG PO TABS: 40.0000 mg | ORAL_TABLET | Freq: Every evening | ORAL | Status: DC

## 2014-02-21 NOTE — Telephone Encounter (Signed)
Refill- pravastatin °

## 2014-02-21 NOTE — Telephone Encounter (Signed)
Appointment scheduled for 02/25/14 °

## 2014-02-21 NOTE — Telephone Encounter (Signed)
Patient left a message stating that he needs a refill on his pain medication. Pt stated that MD had said he needs to come in for additional refills.   Pt does need to come in to get this refilled. Please call and set up an appt for pt

## 2014-02-25 ENCOUNTER — Telehealth: Payer: Self-pay

## 2014-02-25 ENCOUNTER — Ambulatory Visit (INDEPENDENT_AMBULATORY_CARE_PROVIDER_SITE_OTHER): Payer: BC Managed Care – PPO | Admitting: Family Medicine

## 2014-02-25 ENCOUNTER — Encounter: Payer: Self-pay | Admitting: Family Medicine

## 2014-02-25 VITALS — BP 140/110 | HR 76 | Temp 97.5°F | Ht 76.0 in | Wt 234.0 lb

## 2014-02-25 DIAGNOSIS — M25569 Pain in unspecified knee: Secondary | ICD-10-CM

## 2014-02-25 DIAGNOSIS — I4891 Unspecified atrial fibrillation: Secondary | ICD-10-CM

## 2014-02-25 DIAGNOSIS — J449 Chronic obstructive pulmonary disease, unspecified: Secondary | ICD-10-CM

## 2014-02-25 DIAGNOSIS — Z0000 Encounter for general adult medical examination without abnormal findings: Secondary | ICD-10-CM

## 2014-02-25 DIAGNOSIS — Z7901 Long term (current) use of anticoagulants: Secondary | ICD-10-CM

## 2014-02-25 DIAGNOSIS — M25562 Pain in left knee: Secondary | ICD-10-CM

## 2014-02-25 DIAGNOSIS — I10 Essential (primary) hypertension: Secondary | ICD-10-CM

## 2014-02-25 DIAGNOSIS — Z87891 Personal history of nicotine dependence: Secondary | ICD-10-CM

## 2014-02-25 DIAGNOSIS — F17201 Nicotine dependence, unspecified, in remission: Secondary | ICD-10-CM

## 2014-02-25 DIAGNOSIS — E785 Hyperlipidemia, unspecified: Secondary | ICD-10-CM

## 2014-02-25 LAB — RENAL FUNCTION PANEL
Albumin: 5.2 g/dL (ref 3.5–5.2)
BUN: 19 mg/dL (ref 6–23)
CALCIUM: 9.9 mg/dL (ref 8.4–10.5)
CO2: 28 mEq/L (ref 19–32)
CREATININE: 1.01 mg/dL (ref 0.50–1.35)
Chloride: 102 mEq/L (ref 96–112)
Glucose, Bld: 115 mg/dL — ABNORMAL HIGH (ref 70–99)
Phosphorus: 3.4 mg/dL (ref 2.3–4.6)
Potassium: 5 mEq/L (ref 3.5–5.3)
SODIUM: 140 meq/L (ref 135–145)

## 2014-02-25 LAB — HEPATIC FUNCTION PANEL
ALT: 49 U/L (ref 0–53)
AST: 32 U/L (ref 0–37)
Albumin: 5.2 g/dL (ref 3.5–5.2)
Alkaline Phosphatase: 67 U/L (ref 39–117)
BILIRUBIN DIRECT: 0.1 mg/dL (ref 0.0–0.3)
BILIRUBIN TOTAL: 0.7 mg/dL (ref 0.2–1.2)
Indirect Bilirubin: 0.6 mg/dL (ref 0.2–1.2)
Total Protein: 7.5 g/dL (ref 6.0–8.3)

## 2014-02-25 LAB — CBC
HEMATOCRIT: 47.2 % (ref 39.0–52.0)
HEMOGLOBIN: 16.3 g/dL (ref 13.0–17.0)
MCH: 29.7 pg (ref 26.0–34.0)
MCHC: 34.5 g/dL (ref 30.0–36.0)
MCV: 86 fL (ref 78.0–100.0)
Platelets: 332 10*3/uL (ref 150–400)
RBC: 5.49 MIL/uL (ref 4.22–5.81)
RDW: 13.7 % (ref 11.5–15.5)
WBC: 8.5 10*3/uL (ref 4.0–10.5)

## 2014-02-25 LAB — LIPID PANEL
CHOL/HDL RATIO: 5.4 ratio
Cholesterol: 236 mg/dL — ABNORMAL HIGH (ref 0–200)
HDL: 44 mg/dL (ref >39)
LDL Cholesterol: 168 mg/dL — ABNORMAL HIGH (ref 0–99)
TRIGLYCERIDES: 120 mg/dL (ref <150)
VLDL: 24 mg/dL (ref 0–40)

## 2014-02-25 MED ORDER — TRAMADOL HCL 50 MG PO TABS: 50.0000 mg | ORAL_TABLET | Freq: Three times a day (TID) | ORAL | Status: DC | PRN

## 2014-02-25 MED ORDER — DILTIAZEM HCL ER 240 MG PO CP24
240.0000 mg | ORAL_CAPSULE | Freq: Every day | ORAL | Status: DC
Start: 2014-02-25 — End: 2014-02-25

## 2014-02-25 MED ORDER — DILTIAZEM HCL ER 240 MG PO CP24: 240.0000 mg | ORAL_CAPSULE | Freq: Every day | ORAL | Status: DC

## 2014-02-25 MED ORDER — HYDROCODONE-ACETAMINOPHEN 5-325 MG PO TABS: 1.0000 | ORAL_TABLET | Freq: Four times a day (QID) | ORAL | Status: DC | PRN

## 2014-02-25 NOTE — Telephone Encounter (Signed)
No he cannot not take an NSAID or a steroid at this time due to his recent events

## 2014-02-25 NOTE — Telephone Encounter (Signed)
Pharmacy in [place]- [facility]  Pt also stated in vm that he wasn't sure if his new heart medication was sent in?  Pt informed that RX was sent

## 2014-02-25 NOTE — Telephone Encounter (Signed)
Pt called stating that him and Dr Charlett Blake discussed taking a non steroid anti inflammatory. Pt states Dr Charlett Blake stated he couldn't take that with his heart medication and to take Tylenol, etc..? Pt states his wife is having him ask about a steroidal anti-inflammatory? Is there any other kind of steroid to take for his pain and swelling?  Please advise?

## 2014-02-25 NOTE — Patient Instructions (Addendum)
Tylenol/Acetaminophen/APAP max in 24 hours is 3000 mg Tylenol 500 mg is ES, 6 a day but if take hydrocodone  Tylenol 2 in am and 2 pm Can alternate use of Tramadol and Hydrocodone depending on intensity of pain just do not take together.  Ask if it costs less to have colonoscopy at [facility] in [place] stands for "Dietary Approaches to Stop Hypertension." It is a healthy eating plan that has been shown to reduce high blood pressure (hypertension) in as little as 14 days, while also possibly providing other significant health benefits. These other health benefits include reducing the risk of breast cancer after menopause and reducing the risk of type 2 diabetes, heart disease, colon cancer, and stroke. Health benefits also include weight loss and slowing kidney failure in patients with chronic kidney disease.  DIET GUIDELINES  Limit salt (sodium). Your diet should contain less than 1500 mg of sodium daily.  Limit refined or processed carbohydrates. Your diet should include mostly whole grains. Desserts and added sugars should be used sparingly.  Include small amounts of heart-healthy fats. These types of fats include nuts, oils, and tub margarine. Limit saturated and trans fats. These fats have been shown to be harmful in the body. CHOOSING FOODS  The following food groups are based on a 2000 calorie diet. See your Registered Dietitian for individual calorie needs. Grains and Grain Products (6 to 8 servings daily)  Eat More Often: Whole-wheat bread, brown rice, whole-grain or wheat pasta, quinoa, popcorn without added fat or salt (air popped).  Eat Less Often: White bread, white pasta, white rice, cornbread. Vegetables (4 to 5 servings daily)  Eat More Often: Fresh, frozen, and canned vegetables. Vegetables may be raw, steamed, roasted, or grilled with a minimal amount of fat.  Eat Less Often/Avoid: Creamed or fried vegetables. Vegetables in a cheese  sauce. Fruit (4 to 5 servings daily)  Eat More Often: All fresh, canned (in natural juice), or frozen fruits. Dried fruits without added sugar. One hundred percent fruit juice ( cup [237 mL] daily).  Eat Less Often: Dried fruits with added sugar. Canned fruit in light or heavy syrup. [employer], Fish, and Poultry (2 servings or less daily. One serving is 3 to 4 oz [85-114 g]).  Eat More Often: Ninety percent or leaner ground beef, tenderloin, sirloin. Round cuts of beef, chicken breast, [place] breast. All fish. Grill, bake, or broil your meat. Nothing should be fried.  Eat Less Often/Avoid: Fatty cuts of meat, [place], or chicken leg, thigh, or wing. Fried cuts of meat or fish. Dairy (2 to 3 servings)  Eat More Often: Low-fat or fat-free milk, low-fat plain or light yogurt, reduced-fat or part-skim cheese.  Eat Less Often/Avoid: Milk (whole, 2%).Whole milk yogurt. Full-fat cheeses. Nuts, Seeds, and Legumes (4 to 5 servings per week)  Eat More Often: All without added salt.  Eat Less Often/Avoid: Salted nuts and seeds, canned beans with added salt. Fats and Sweets (limited)  Eat More Often: Vegetable oils, tub margarines without trans fats, sugar-free gelatin. Mayonnaise and salad dressings.  Eat Less Often/Avoid: Coconut oils, palm oils, butter, stick margarine, cream, half and half, cookies, candy, pie. FOR MORE INFORMATION The Dash Diet Eating Plan: www.dashdiet.org Document Released: 11/28/2011 Document Revised: 03/02/2012 Document Reviewed: 11/28/2011 [facility] Patient Information 2014 [place], [place].

## 2014-02-25 NOTE — Progress Notes (Signed)
Pre visit review using our clinic review tool, if applicable. No additional management support is needed unless otherwise documented below in the visit note. 

## 2014-02-25 NOTE — Progress Notes (Signed)
Patient ID: Marc Hansen, male   DOB: 1961-01-08, 53 y.o.   MRN: [id number] Marc Hansen [id number] [birth year] 02/25/2014      Progress Note-Follow Up  Subjective  Chief Complaint  Chief Complaint  Patient presents with  . Follow-up    med refills    HPI  Patient is a 53 year old Caucasian male who is in today for followup. Overall he reports doing well. He complains of some pain and stiffness in his left knee and left thumb but otherwise feels well. Denies any chest pain, palpitations, shortness of breath, fatigue, GI or GU concerns. Is taking medications as prescribed  Past Medical History  Diagnosis Date  . Seasonal allergies   . Hypertension   . Overweight   . Arthritis   . GERD (gastroesophageal reflux disease)   . Vitamin D deficiency 04/17/2011  . Hand crush injury 07/05/2011  . Umbilical hernia 01/24/5426  . Hyperlipidemia   . Atrial fibrillation 06/05/13    a. Dx 05/2013 in setting of CVA. Initially on Pradaxa, changed to Coumadin due to MR.  Marland Kitchen Abnormal liver function 08/20/2012  . Medial epicondylitis of right elbow 11/09/2012  . CVA (cerebral infarction) 06/05/13    a. 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 Also new afib. Carotid dopplers 0-39% bilat IC stenosis.   . Mitral regurgitation     a. Mod by TEE 08/30/13.  Marland Kitchen COPD (chronic obstructive pulmonary disease)   . Alcohol abuse   . Cardiomyopathy     a. EF 45-50% in 05/2013, 40-45% by TEE 08/2013.    Past Surgical History  Procedure Laterality Date  . Vasectomy    . Carpal tunnel release      right, needed some nerve repair as well  . Right knee arthroscopy    . Tee without cardioversion N/A 08/30/2013    Procedure: TRANSESOPHAGEAL ECHOCARDIOGRAM (TEE);  Surgeon: Lelon Perla, MD;  Location: [facility] ENDOSCOPY;  Service: Cardiovascular;  Laterality: N/A;    Family History  Problem Relation Age of Onset  . Cancer Mother 35    breast- remission  . Fibromyalgia Mother   . Other  Father     CHF- stent   . Heart attack Father   . Heart disease Maternal Uncle   . Heart disease Maternal Grandmother   . Heart disease Maternal Grandfather     History   Social History  . Marital Status: Married    Spouse Name: N/A    Number of Children: 3  . Years of Education: N/A   Occupational History  .     Social History Main Topics  . Smoking status: Former Smoker -- 0.25 packs/day for 26 years    Types: Cigarettes  . Smokeless tobacco: Never Used     Comment: uses a vap every now and then  . Alcohol Use: 29.4 oz/week    49 Cans of beer per week     Comment: 6-7 beers/daily, plus added cocktails on the weekend  . Drug Use: Yes     Comment: marijuana occasionally  . Sexual Activity: Yes    Partners: Female   Other Topics Concern  . Not on file   Social History Narrative   Does work with Probation officer. Frequent travel.     Current Outpatient Prescriptions on File Prior to Visit  Medication Sig Dispense Refill  . albuterol (PROVENTIL HFA;VENTOLIN HFA) 108 (90 BASE) MCG/ACT inhaler Inhale 1-2 puffs into the lungs every 6 (six) hours as needed for  wheezing or shortness of breath.  18 g  2  . albuterol (PROVENTIL) (2.5 MG/3ML) 0.083% nebulizer solution Take 2.5 mg by nebulization every 6 (six) hours as needed for wheezing or shortness of breath.      . diltiazem (DILACOR XR) 180 MG 24 hr capsule Take 1 capsule (180 mg total) by mouth daily.  90 capsule  1  . Fluticasone-Salmeterol (ADVAIR) 250-50 MCG/DOSE AEPB Inhale 1 puff into the lungs 2 (two) times daily.      . folic acid (FOLVITE) 1 MG tablet Take 1 mg by mouth daily.      Marland Kitchen HYDROcodone-acetaminophen (NORCO/VICODIN) 5-325 MG per tablet Take 1 tablet by mouth every 6 (six) hours as needed for moderate pain.  60 tablet  0  . losartan (COZAAR) 50 MG tablet Take 1 tablet (50 mg total) by mouth daily.  30 tablet  0  . MILK THISTLE PO Take 1 tablet by mouth daily.      . Multiple Vitamin (MULTIVITAMIN WITH  MINERALS) TABS tablet Take 1 tablet by mouth daily.      Marland Kitchen omeprazole (PRILOSEC) 40 MG capsule TAKE ONE CAPSULE BY MOUTH EVERY 3 DAYS  15 capsule  1  . pravastatin (PRAVACHOL) 40 MG tablet Take 1 tablet (40 mg total) by mouth every evening.  30 tablet  2  . thiamine (VITAMIN B-1) 100 MG tablet Take 100 mg by mouth daily.      Marland Kitchen warfarin (COUMADIN) 5 MG tablet Take as directed by coumadin clinic  180 tablet  1  . [DISCONTINUED] esomeprazole (NEXIUM) 40 MG capsule Take 1 capsule (40 mg total) by mouth daily.  30 capsule  5  . [DISCONTINUED] Fluticasone Propionate, Inhal, (FLOVENT DISKUS) 100 MCG/BLIST AEPB Prn   60 each  1  . [DISCONTINUED] loratadine (CLARITIN) 10 MG tablet Take 1 tablet (10 mg total) by mouth daily.  30 tablet  2   No current facility-administered medications on file prior to visit.    Allergies  Allergen Reactions  . Atenolol     Felt contributing to dyspnea per Dr. Jacalyn Lefevre office note    Review of Systems  Review of Systems  Constitutional: Negative for fever and malaise/fatigue.  HENT: Negative for congestion.   Eyes: Negative for discharge.  Respiratory: Negative for shortness of breath.   Cardiovascular: Negative for chest pain, palpitations and leg swelling.  Gastrointestinal: Negative for nausea, abdominal pain and diarrhea.  Genitourinary: Negative for dysuria.  Musculoskeletal: Positive for joint pain. Negative for falls.  Skin: Negative for rash.  Neurological: Negative for loss of consciousness and headaches.  Endo/Heme/Allergies: Negative for polydipsia.  Psychiatric/Behavioral: Negative for depression and suicidal ideas. The patient is not nervous/anxious and does not have insomnia.     Objective  BP 140/110  Pulse 76  Temp(Src) 97.5 F (36.4 C) (Oral)  Ht 6\' 4"  (1.93 m)  Wt 234 lb (106.142 kg)  BMI 28.50 kg/m2  SpO2 94%  Physical Exam  Physical Exam  Constitutional: He is oriented to person, place, and time and well-developed,  well-nourished, and in no distress. No distress.  HENT:  Head: Normocephalic and atraumatic.  Eyes: Conjunctivae are normal.  Neck: Neck supple. No thyromegaly present.  Cardiovascular: Normal rate and regular rhythm.   Murmur heard. 1/6 M  Pulmonary/Chest: Effort normal and breath sounds normal. No respiratory distress.  Abdominal: He exhibits no distension and no mass. There is no tenderness.  Musculoskeletal: He exhibits no edema.  Neurological: He is alert and oriented to person,  place, and time.  Skin: Skin is warm.  Psychiatric: Memory, affect and judgment normal.    Lab Results  Component Value Date   TSH 1.147 11/21/2013   Lab Results  Component Value Date   WBC 9.6 11/23/2013   HGB 15.6 11/23/2013   HCT 45.1 11/23/2013   MCV 89.7 11/23/2013   PLT 243 11/23/2013   Lab Results  Component Value Date   CREATININE 1.04 11/23/2013   BUN 17 11/23/2013   NA 140 11/23/2013   K 4.7 11/23/2013   CL 103 11/23/2013   CO2 26 11/23/2013   Lab Results  Component Value Date   ALT 40 11/21/2013   AST 30 11/21/2013   ALKPHOS 71 11/21/2013   BILITOT 0.3 11/21/2013   Lab Results  Component Value Date   CHOL 187 06/06/2013   Lab Results  Component Value Date   HDL 31* 06/06/2013   Lab Results  Component Value Date   LDLCALC 132* 06/06/2013   Lab Results  Component Value Date   TRIG 119 06/06/2013   Lab Results  Component Value Date   CHOLHDL 6.0 06/06/2013     Assessment & Plan  Tobacco abuse, in remission Continues to abstain  HTN (hypertension) Increased Diltiazem to 240 mg, minimize sodium and caffeine  Hyperlipidemia Tolerating Pravastatin but incomplete control will switch to Atorvastatin if he agrees. Avoid trans fats  Long term (current) use of anticoagulants Following with coumadin clinic  COPD (chronic obstructive pulmonary disease) Improving since quitting smoking  Atrial fibrillation Rate controlled but irregular today

## 2014-02-26 ENCOUNTER — Telehealth: Payer: Self-pay | Admitting: Family Medicine

## 2014-02-26 LAB — PSA: PSA: 1.52 ng/mL (ref <=4.00)

## 2014-02-26 LAB — TSH: TSH: 1.973 u[IU]/mL (ref 0.350–4.500)

## 2014-02-26 NOTE — Telephone Encounter (Signed)
Relevant patient education assigned to patient using Emmi. ° °

## 2014-02-27 ENCOUNTER — Encounter: Payer: Self-pay | Admitting: Family Medicine

## 2014-02-27 NOTE — Assessment & Plan Note (Signed)
Continues to abstain 

## 2014-02-27 NOTE — Assessment & Plan Note (Signed)
Following with coumadin clinic

## 2014-02-27 NOTE — Assessment & Plan Note (Signed)
Increased Diltiazem to 240 mg, minimize sodium and caffeine

## 2014-02-27 NOTE — Assessment & Plan Note (Signed)
Tolerating Pravastatin but incomplete control will switch to Atorvastatin if he agrees. Avoid trans fats

## 2014-02-27 NOTE — Assessment & Plan Note (Signed)
Improving since quitting smoking

## 2014-02-27 NOTE — Assessment & Plan Note (Signed)
Rate controlled but irregular today

## 2014-02-28 ENCOUNTER — Telehealth: Payer: Self-pay | Admitting: Family Medicine

## 2014-02-28 ENCOUNTER — Telehealth: Payer: Self-pay

## 2014-02-28 DIAGNOSIS — E785 Hyperlipidemia, unspecified: Secondary | ICD-10-CM

## 2014-02-28 MED ORDER — ATORVASTATIN CALCIUM 40 MG PO TABS: 40.0000 mg | ORAL_TABLET | Freq: Every day | ORAL | Status: DC

## 2014-02-28 NOTE — Telephone Encounter (Signed)
Relevant patient education assigned to patient using Emmi. ° °

## 2014-02-28 NOTE — Addendum Note (Signed)
Addended by: Varney Daily on: 02/28/2014 12:25 PM   Modules accepted: Orders

## 2014-02-28 NOTE — Telephone Encounter (Signed)
Lab order placed per md

## 2014-03-07 ENCOUNTER — Ambulatory Visit (INDEPENDENT_AMBULATORY_CARE_PROVIDER_SITE_OTHER): Payer: BC Managed Care – PPO

## 2014-03-07 DIAGNOSIS — Z7901 Long term (current) use of anticoagulants: Secondary | ICD-10-CM

## 2014-03-07 DIAGNOSIS — Z5181 Encounter for therapeutic drug level monitoring: Secondary | ICD-10-CM

## 2014-03-07 DIAGNOSIS — I4891 Unspecified atrial fibrillation: Secondary | ICD-10-CM

## 2014-03-07 LAB — POCT INR: INR: 2.2

## 2014-03-30 ENCOUNTER — Telehealth: Payer: Self-pay | Admitting: Family Medicine

## 2014-03-30 DIAGNOSIS — M25562 Pain in left knee: Secondary | ICD-10-CM

## 2014-03-30 MED ORDER — TRAMADOL HCL 50 MG PO TABS: 50.0000 mg | ORAL_TABLET | Freq: Three times a day (TID) | ORAL | Status: DC | PRN

## 2014-03-30 NOTE — Telephone Encounter (Signed)
Rx signed. Notified pt and faxed it to [facility].

## 2014-03-30 NOTE — Telephone Encounter (Signed)
Last rx printed 02/25/14, #30 x no refills. Pt last seen in 02/2014 and has follow up in July. Rx printed and forwarded to Provider for signature.

## 2014-03-30 NOTE — Telephone Encounter (Signed)
Patient left message requesting refill on Tramadol, he does have a few left, call patient when refill is done

## 2014-04-18 ENCOUNTER — Ambulatory Visit (INDEPENDENT_AMBULATORY_CARE_PROVIDER_SITE_OTHER): Payer: BC Managed Care – PPO

## 2014-04-18 DIAGNOSIS — Z5181 Encounter for therapeutic drug level monitoring: Secondary | ICD-10-CM

## 2014-04-18 DIAGNOSIS — I4891 Unspecified atrial fibrillation: Secondary | ICD-10-CM

## 2014-04-18 DIAGNOSIS — Z7901 Long term (current) use of anticoagulants: Secondary | ICD-10-CM

## 2014-04-18 LAB — POCT INR: INR: 2.8

## 2014-04-28 ENCOUNTER — Other Ambulatory Visit: Payer: Self-pay

## 2014-04-28 DIAGNOSIS — M25562 Pain in left knee: Secondary | ICD-10-CM

## 2014-04-28 MED ORDER — HYDROCODONE-ACETAMINOPHEN 5-325 MG PO TABS: 1.0000 | ORAL_TABLET | Freq: Four times a day (QID) | ORAL | Status: DC | PRN

## 2014-04-28 NOTE — Telephone Encounter (Signed)
Pt called in requesting refills on his Hydrocodone.  rx printed for md to sign. Last RX was done on3-6-15 quantity 60 with 0 refills.  Pt informed and spouse will come pick up

## 2014-05-06 ENCOUNTER — Telehealth: Payer: Self-pay | Admitting: Cardiology

## 2014-05-06 ENCOUNTER — Telehealth: Payer: Self-pay

## 2014-05-06 NOTE — Telephone Encounter (Signed)
I would advise him that with the trouble he has had so much trouble with his heart this year. He should hold off and discuss this with cardiology next month when he sees them before we restart this.

## 2014-05-06 NOTE — Telephone Encounter (Signed)
Will forward for dr crenshaw review  

## 2014-05-06 NOTE — Telephone Encounter (Signed)
New message    CVA a year ago . On coumadin. PCP wants to know can patient take viragra.

## 2014-05-06 NOTE — Telephone Encounter (Signed)
Ok from my standpoint; never mix with NTG. Marc Hansen

## 2014-05-06 NOTE — Telephone Encounter (Signed)
Patient called asking for a refill on his viagra? Pt stated that he had some left but the bottle is expired. Please advise?

## 2014-05-09 ENCOUNTER — Other Ambulatory Visit: Payer: Self-pay | Admitting: Family Medicine

## 2014-05-09 ENCOUNTER — Telehealth: Payer: Self-pay

## 2014-05-09 MED ORDER — SILDENAFIL CITRATE 50 MG PO TABS: 50.0000 mg | ORAL_TABLET | Freq: Every day | ORAL | Status: AC | PRN

## 2014-05-09 NOTE — Telephone Encounter (Signed)
Pt states he has taken this in the past and had another RX for this but the pharmacy wouldn't take it because it was so old.  RX sent and pt informed of below note

## 2014-05-09 NOTE — Telephone Encounter (Signed)
Patient left a message stating that Dr Lonia Skinner office just called him back and stated that it was ok for him to take the Viagra and left him with some instructions and is going to send a note to Dr Charlett Blake?  Pt would like Dr Charlett Blake to send this medication in for him   Please advise?

## 2014-05-09 NOTE — Telephone Encounter (Signed)
Spoke with pt, aware he can not use NTG 24 hours after taking viragra. Patient voiced understanding Forwarded to dr blyth per pt request

## 2014-05-09 NOTE — Telephone Encounter (Signed)
I do not see where we have prescribed this previously. Has he ever taken it? If not make sure he knows he could have Headache or chest pain with use. If he does he cannot take ntg with the viagra or it will drop hi bp dangerously low. Disp Viagra 50 mg tabs 1 tab po daily prn Ed. Disp #6 for now

## 2014-05-17 ENCOUNTER — Other Ambulatory Visit: Payer: Self-pay | Admitting: Family Medicine

## 2014-05-27 ENCOUNTER — Other Ambulatory Visit: Payer: Self-pay | Admitting: Family Medicine

## 2014-05-27 NOTE — Telephone Encounter (Signed)
Informed patient of this.  °

## 2014-05-27 NOTE — Telephone Encounter (Signed)
Patient called in on this stating that he has been out of this medication for the past two days. Not sure why it had been sent to Dr[facility] Anit[facility] Lauth. [facility]

## 2014-05-30 ENCOUNTER — Ambulatory Visit (INDEPENDENT_AMBULATORY_CARE_PROVIDER_SITE_OTHER): Payer: BC Managed Care – PPO | Admitting: *Deleted

## 2014-05-30 DIAGNOSIS — Z7901 Long term (current) use of anticoagulants: Secondary | ICD-10-CM

## 2014-05-30 DIAGNOSIS — I4891 Unspecified atrial fibrillation: Secondary | ICD-10-CM

## 2014-05-30 DIAGNOSIS — Z5181 Encounter for therapeutic drug level monitoring: Secondary | ICD-10-CM

## 2014-05-30 LAB — POCT INR: INR: 3.9

## 2014-06-10 ENCOUNTER — Ambulatory Visit (INDEPENDENT_AMBULATORY_CARE_PROVIDER_SITE_OTHER): Payer: BC Managed Care – PPO | Admitting: *Deleted

## 2014-06-10 DIAGNOSIS — I4891 Unspecified atrial fibrillation: Secondary | ICD-10-CM

## 2014-06-10 DIAGNOSIS — Z5181 Encounter for therapeutic drug level monitoring: Secondary | ICD-10-CM

## 2014-06-10 DIAGNOSIS — Z7901 Long term (current) use of anticoagulants: Secondary | ICD-10-CM

## 2014-06-10 LAB — POCT INR: INR: 2.7

## 2014-06-15 ENCOUNTER — Other Ambulatory Visit: Payer: Self-pay | Admitting: Family Medicine

## 2014-06-15 ENCOUNTER — Telehealth: Payer: Self-pay | Admitting: Family Medicine

## 2014-06-15 DIAGNOSIS — M25562 Pain in left knee: Secondary | ICD-10-CM

## 2014-06-15 MED ORDER — HYDROCODONE-ACETAMINOPHEN 5-325 MG PO TABS: 1.0000 | ORAL_TABLET | Freq: Four times a day (QID) | ORAL | Status: DC | PRN

## 2014-06-15 NOTE — Telephone Encounter (Signed)
Patient is requesting refill on Hydrocodone. He is working out of town for the next couple of weeks so it is OK for his wife to pick up

## 2014-06-15 NOTE — Telephone Encounter (Signed)
I have signed his rx [facility] is in [facility]

## 2014-06-16 NOTE — Telephone Encounter (Signed)
Patient informed ready for pick up.

## 2014-07-01 ENCOUNTER — Ambulatory Visit (INDEPENDENT_AMBULATORY_CARE_PROVIDER_SITE_OTHER): Payer: BC Managed Care – PPO | Admitting: Family Medicine

## 2014-07-01 ENCOUNTER — Encounter: Payer: Self-pay | Admitting: Family Medicine

## 2014-07-01 VITALS — BP 102/80 | HR 86 | Temp 98.1°F | Ht 76.0 in | Wt 236.0 lb

## 2014-07-01 DIAGNOSIS — E785 Hyperlipidemia, unspecified: Secondary | ICD-10-CM

## 2014-07-01 DIAGNOSIS — E663 Overweight: Secondary | ICD-10-CM

## 2014-07-01 DIAGNOSIS — I4891 Unspecified atrial fibrillation: Secondary | ICD-10-CM

## 2014-07-01 DIAGNOSIS — I10 Essential (primary) hypertension: Secondary | ICD-10-CM

## 2014-07-01 DIAGNOSIS — Z0000 Encounter for general adult medical examination without abnormal findings: Secondary | ICD-10-CM

## 2014-07-01 DIAGNOSIS — M79645 Pain in left finger(s): Secondary | ICD-10-CM

## 2014-07-01 DIAGNOSIS — I639 Cerebral infarction, unspecified: Secondary | ICD-10-CM

## 2014-07-01 DIAGNOSIS — J449 Chronic obstructive pulmonary disease, unspecified: Secondary | ICD-10-CM

## 2014-07-01 DIAGNOSIS — M79609 Pain in unspecified limb: Secondary | ICD-10-CM

## 2014-07-01 DIAGNOSIS — J45909 Unspecified asthma, uncomplicated: Secondary | ICD-10-CM

## 2014-07-01 DIAGNOSIS — M25562 Pain in left knee: Secondary | ICD-10-CM

## 2014-07-01 DIAGNOSIS — F17201 Nicotine dependence, unspecified, in remission: Secondary | ICD-10-CM

## 2014-07-01 DIAGNOSIS — I482 Chronic atrial fibrillation, unspecified: Secondary | ICD-10-CM

## 2014-07-01 DIAGNOSIS — J3089 Other allergic rhinitis: Secondary | ICD-10-CM

## 2014-07-01 DIAGNOSIS — M25569 Pain in unspecified knee: Secondary | ICD-10-CM

## 2014-07-01 DIAGNOSIS — K219 Gastro-esophageal reflux disease without esophagitis: Secondary | ICD-10-CM

## 2014-07-01 DIAGNOSIS — F1010 Alcohol abuse, uncomplicated: Secondary | ICD-10-CM

## 2014-07-01 DIAGNOSIS — I6350 Cerebral infarction due to unspecified occlusion or stenosis of unspecified cerebral artery: Secondary | ICD-10-CM

## 2014-07-01 DIAGNOSIS — J302 Other seasonal allergic rhinitis: Secondary | ICD-10-CM | POA: Insufficient documentation

## 2014-07-01 DIAGNOSIS — Z1211 Encounter for screening for malignant neoplasm of colon: Secondary | ICD-10-CM

## 2014-07-01 HISTORY — DX: Encounter for general adult medical examination without abnormal findings: Z00.00

## 2014-07-01 HISTORY — DX: Pain in left finger(s): M79.645

## 2014-07-01 LAB — HEPATIC FUNCTION PANEL
ALT: 34 U/L (ref 0–53)
AST: 24 U/L (ref 0–37)
Albumin: 4.9 g/dL (ref 3.5–5.2)
Alkaline Phosphatase: 85 U/L (ref 39–117)
Bilirubin, Direct: 0.1 mg/dL (ref 0.0–0.3)
Indirect Bilirubin: 0.6 mg/dL (ref 0.2–1.2)
Total Bilirubin: 0.7 mg/dL (ref 0.2–1.2)
Total Protein: 7.3 g/dL (ref 6.0–8.3)

## 2014-07-01 LAB — RENAL FUNCTION PANEL
Albumin: 4.9 g/dL (ref 3.5–5.2)
BUN: 21 mg/dL (ref 6–23)
CO2: 26 mEq/L (ref 19–32)
Calcium: 9.9 mg/dL (ref 8.4–10.5)
Chloride: 103 mEq/L (ref 96–112)
Creat: 1.09 mg/dL (ref 0.50–1.35)
Glucose, Bld: 93 mg/dL (ref 70–99)
Phosphorus: 3.1 mg/dL (ref 2.3–4.6)
Potassium: 4.4 mEq/L (ref 3.5–5.3)
Sodium: 138 mEq/L (ref 135–145)

## 2014-07-01 LAB — CBC
HCT: 40.9 % (ref 39.0–52.0)
Hemoglobin: 14.3 g/dL (ref 13.0–17.0)
MCH: 29.9 pg (ref 26.0–34.0)
MCHC: 35 g/dL (ref 30.0–36.0)
MCV: 85.6 fL (ref 78.0–100.0)
Platelets: 344 10*3/uL (ref 150–400)
RBC: 4.78 MIL/uL (ref 4.22–5.81)
RDW: 13.7 % (ref 11.5–15.5)
WBC: 8.9 10*3/uL (ref 4.0–10.5)

## 2014-07-01 LAB — LIPID PANEL
Cholesterol: 166 mg/dL (ref 0–200)
HDL: 40 mg/dL (ref >39)
LDL Cholesterol: 104 mg/dL — ABNORMAL HIGH (ref 0–99)
Total CHOL/HDL Ratio: 4.2 Ratio
Triglycerides: 109 mg/dL (ref <150)
VLDL: 22 mg/dL (ref 0–40)

## 2014-07-01 LAB — URIC ACID: Uric Acid, Serum: 7.7 mg/dL (ref 4.0–7.8)

## 2014-07-01 MED ORDER — ALBUTEROL SULFATE HFA 108 (90 BASE) MCG/ACT IN AERS: 1.0000 | INHALATION_SPRAY | Freq: Four times a day (QID) | RESPIRATORY_TRACT | Status: DC | PRN

## 2014-07-01 MED ORDER — TRAMADOL HCL 50 MG PO TABS: 50.0000 mg | ORAL_TABLET | Freq: Three times a day (TID) | ORAL | Status: DC | PRN

## 2014-07-01 MED ORDER — LOSARTAN POTASSIUM 50 MG PO TABS: 50.0000 mg | ORAL_TABLET | Freq: Every day | ORAL | Status: DC

## 2014-07-01 MED ORDER — MONTELUKAST SODIUM 10 MG PO TABS: 10.0000 mg | ORAL_TABLET | Freq: Every evening | ORAL | Status: AC | PRN

## 2014-07-01 MED ORDER — ATORVASTATIN CALCIUM 40 MG PO TABS: 40.0000 mg | ORAL_TABLET | Freq: Every day | ORAL | Status: DC

## 2014-07-01 MED ORDER — OMEPRAZOLE 40 MG PO CPDR: 40.0000 mg | DELAYED_RELEASE_CAPSULE | Freq: Every day | ORAL | Status: AC

## 2014-07-01 MED ORDER — DILTIAZEM HCL ER 240 MG PO CP24: 240.0000 mg | ORAL_CAPSULE | Freq: Every day | ORAL | Status: DC

## 2014-07-01 MED ORDER — HYDROCODONE-ACETAMINOPHEN 5-325 MG PO TABS: 1.0000 | ORAL_TABLET | Freq: Four times a day (QID) | ORAL | Status: DC | PRN

## 2014-07-01 NOTE — Assessment & Plan Note (Signed)
No residual deficits and no recurrece on current meds

## 2014-07-01 NOTE — Assessment & Plan Note (Signed)
Encouraged DASH diet, decrease po intake and increase exercise as tolerated. Needs 7-8 hours of sleep nightly. Avoid trans fats, eat small, frequent meals every 4-5 hours with lean proteins, complex carbs and healthy fats. Minimize simple carbs 

## 2014-07-01 NOTE — Assessment & Plan Note (Signed)
Rate controlled, tolerating Coumadin 

## 2014-07-01 NOTE — Assessment & Plan Note (Signed)
Not using Advair due to cost. Flares with allergic symptoms start Singulair and can continue Albuterol prn

## 2014-07-01 NOTE — Progress Notes (Signed)
Patient ID: Marc Hansen, male   DOB: 1961-01-08, 53 y.o.   MRN: [id number] Marc Hansen [id number] [birth year] 07/01/2014      Progress Note-Follow Up  Subjective  Chief Complaint  Chief Complaint  Patient presents with  . Annual Exam    physical    HPI  Patient is a 53 year old male in today for routine medical care. He is doing well but he is complaining of persistent left thumb pain. Over 2 years ago he, flank pain and swelling ever since. He is questioning whether or not he has gout his father did have gout. He is agreeing to referral for colonoscopy finding. No bloody or tarry stool. Is not using his Advair and distant his breathing is pretty good until recently when his allergies flare up in the developed some chest tightness. He's had no recurrent neurologic complaints. He denies any other acute concerns. Denies CP/palp/SOB/HA/congestion/fevers/GI or GU c/o.   Past Medical History  Diagnosis Date  . Seasonal allergies   . Hypertension   . Overweight(278.02)   . Arthritis   . GERD (gastroesophageal reflux disease)   . Vitamin D deficiency 04/17/2011  . Hand crush injury 07/05/2011  . Umbilical hernia 07/07/9677  . Hyperlipidemia   . Atrial fibrillation 06/05/13    a. Dx 05/2013 in setting of CVA. Initially on Pradaxa, changed to Coumadin due to MR.  Marland Kitchen Abnormal liver function 08/20/2012  . Medial epicondylitis of right elbow 11/09/2012  . CVA (cerebral infarction) 06/05/13    a. 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 Also new afib. Carotid dopplers 0-39% bilat IC stenosis.   . Mitral regurgitation     a. Mod by TEE 08/30/13.  Marland Kitchen COPD (chronic obstructive pulmonary disease)   . Alcohol abuse   . Cardiomyopathy     a. EF 45-50% in 05/2013, 40-45% by TEE 08/2013.  Marland Kitchen Preventative health care 07/01/2014    Past Surgical History  Procedure Laterality Date  . Vasectomy    . Carpal tunnel release      right, needed some nerve repair as well  .  Right knee arthroscopy    . Tee without cardioversion N/A 08/30/2013    Procedure: TRANSESOPHAGEAL ECHOCARDIOGRAM (TEE);  Surgeon: Lelon Perla, MD;  Location: [facility] ENDOSCOPY;  Service: Cardiovascular;  Laterality: N/A;    Family History  Problem Relation Age of Onset  . Cancer Mother 59    breast- remission  . Fibromyalgia Mother   . Other Father     CHF- stent   . Heart attack Father   . Heart disease Maternal Uncle   . Heart disease Maternal Grandmother   . Heart disease Maternal Grandfather     History   Social History  . Marital Status: Married    Spouse Name: N/A    Number of Children: 3  . Years of Education: N/A   Occupational History  .     Social History Main Topics  . Smoking status: Former Smoker -- 0.25 packs/day for 26 years    Types: Cigarettes  . Smokeless tobacco: Never Used     Comment: uses a vap every now and then  . Alcohol Use: 29.4 oz/week    49 Cans of beer per week     Comment: 6-7 beers/daily, plus added cocktails on the weekend  . Drug Use: Yes     Comment: marijuana occasionally  . Sexual Activity: Yes    Partners: Female   Other Topics Concern  .  Not on file   Social History Narrative   Does work with Probation officer. Frequent travel.     Current Outpatient Prescriptions on File Prior to Visit  Medication Sig Dispense Refill  . albuterol (PROVENTIL) (2.5 MG/3ML) 0.083% nebulizer solution Take 2.5 mg by nebulization every 6 (six) hours as needed for wheezing or shortness of breath.      . folic acid (FOLVITE) 1 MG tablet Take 1 mg by mouth daily.      Marland Kitchen MILK THISTLE PO Take 1 tablet by mouth daily.      . Multiple Vitamin (MULTIVITAMIN WITH MINERALS) TABS tablet Take 1 tablet by mouth daily.      . sildenafil (VIAGRA) 50 MG tablet Take 1 tablet (50 mg total) by mouth daily as needed for erectile dysfunction.  6 tablet  0  . thiamine (VITAMIN B-1) 100 MG tablet Take 100 mg by mouth daily.      Marland Kitchen warfarin (COUMADIN) 5 MG tablet  Take as directed by coumadin clinic  180 tablet  1  . [DISCONTINUED] esomeprazole (NEXIUM) 40 MG capsule Take 1 capsule (40 mg total) by mouth daily.  30 capsule  5  . [DISCONTINUED] Fluticasone Propionate, Inhal, (FLOVENT DISKUS) 100 MCG/BLIST AEPB Prn   60 each  1  . [DISCONTINUED] loratadine (CLARITIN) 10 MG tablet Take 1 tablet (10 mg total) by mouth daily.  30 tablet  2   No current facility-administered medications on file prior to visit.    Allergies  Allergen Reactions  . Atenolol     Felt contributing to dyspnea per Dr. Jacalyn Lefevre office note    Review of Systems  Review of Systems  Constitutional: Negative for fever and malaise/fatigue.  HENT: Negative for congestion.   Eyes: Negative for discharge.  Respiratory: Negative for shortness of breath.   Cardiovascular: Negative for chest pain, palpitations and leg swelling.  Gastrointestinal: Negative for nausea, abdominal pain and diarrhea.  Genitourinary: Negative for dysuria.  Musculoskeletal: Negative for falls.  Skin: Negative for rash.  Neurological: Negative for loss of consciousness and headaches.  Endo/Heme/Allergies: Negative for polydipsia.  Psychiatric/Behavioral: Negative for depression and suicidal ideas. The patient is not nervous/anxious and does not have insomnia.     Objective  BP 102/80  Pulse 86  Temp(Src) 98.1 F (36.7 C) (Oral)  Ht 6\' 4"  (1.93 m)  Wt 236 lb (107.049 kg)  BMI 28.74 kg/m2  SpO2 94%  Physical Exam  Physical Exam  Constitutional: He is oriented to person, place, and time and well-developed, well-nourished, and in no distress. No distress.  HENT:  Head: Normocephalic and atraumatic.  Eyes: Conjunctivae are normal.  Neck: Neck supple. No thyromegaly present.  Cardiovascular: Normal rate and regular rhythm.   Murmur heard. 1/6 sys M  Pulmonary/Chest: Effort normal and breath sounds normal. No respiratory distress.  Abdominal: Soft. Bowel sounds are normal. He exhibits no  distension and no mass. There is no tenderness.  Genitourinary: Guaiac negative stool.  Musculoskeletal: He exhibits no edema.  Neurological: He is alert and oriented to person, place, and time.  Skin: Skin is warm.  Psychiatric: Memory, affect and judgment normal.    Lab Results  Component Value Date   TSH 1.973 02/25/2014   Lab Results  Component Value Date   WBC 8.5 02/25/2014   HGB 16.3 02/25/2014   HCT 47.2 02/25/2014   MCV 86.0 02/25/2014   PLT 332 02/25/2014   Lab Results  Component Value Date   CREATININE 1.01 02/25/2014   BUN  19 02/25/2014   NA 140 02/25/2014   K 5.0 02/25/2014   CL 102 02/25/2014   CO2 28 02/25/2014   Lab Results  Component Value Date   ALT 49 02/25/2014   AST 32 02/25/2014   ALKPHOS 67 02/25/2014   BILITOT 0.7 02/25/2014   Lab Results  Component Value Date   CHOL 236* 02/25/2014   Lab Results  Component Value Date   HDL 44 02/25/2014   Lab Results  Component Value Date   LDLCALC 168* 02/25/2014   Lab Results  Component Value Date   TRIG 120 02/25/2014   Lab Results  Component Value Date   CHOLHDL 5.4 02/25/2014     Assessment & Plan  HTN (hypertension) Well controlled, no changes to meds. Encouraged heart healthy diet such as the DASH diet and exercise as tolerated.   Atrial fibrillation Rate controlled, tolerating Coumadin  Tobacco abuse, in remission No cigarettes for several months.  Overweight Encouraged DASH diet, decrease po intake and increase exercise as tolerated. Needs 7-8 hours of sleep nightly. Avoid trans fats, eat small, frequent meals every 4-5 hours with lean proteins, complex carbs and healthy fats. Minimize simple carbs  CVA (cerebral infarction) No residual deficits and no recurrece on current meds  Alcohol abuse Has cut down considerably, is encouraged to stick to no more than 2-3 beverages daily  Preventative health care Patient encouraged to maintain heart healthy diet, regular exercise, adequate sleep. Consider daily probiotics.  Take medications as prescribed. Referred for screening colonoscopy  Pain of left thumb Check uric acid level and xray, try topical treatments such as Aspercreme  COPD (chronic obstructive pulmonary disease) Not using Advair due to cost. Flares with allergic symptoms start Singulair and can continue Albuterol prn

## 2014-07-01 NOTE — Patient Instructions (Signed)
Cholesterol  Cholesterol is a white, waxy, fat-like protein needed by your body in small amounts. The liver makes all the cholesterol you need. Cholesterol is carried from the liver by the blood through the blood vessels. Deposits of cholesterol (plaque) may build up on blood vessel walls. These make the arteries narrower and stiffer. Cholesterol plaques increase the risk for heart attack and stroke.   You cannot feel your cholesterol level even if it is very high. The only way to know it is high is with a blood test. Once you know your cholesterol levels, you should keep a record of the test results. Work with your health care provider to keep your levels in the desired range.   WHAT DO THE RESULTS MEAN?  · Total cholesterol is a rough measure of all the cholesterol in your blood.    · LDL is the so-called bad cholesterol. This is the type that deposits cholesterol in the walls of the arteries. You want this level to be low.    · HDL is the good cholesterol because it cleans the arteries and carries the LDL away. You want this level to be high.  · Triglycerides are fat that the body can either burn for energy or store. High levels are closely linked to heart disease.    WHAT ARE THE DESIRED LEVELS OF CHOLESTEROL?  · Total cholesterol below 200.    · LDL below 100 for people at risk, below 70 for those at very high risk.    · HDL above 50 is good, above 60 is best.    · Triglycerides below 150.    HOW CAN I LOWER MY CHOLESTEROL?  · Diet. Follow your diet programs as directed by your health care provider.    ¨ Choose fish or white meat chicken and turkey, roasted or baked. Limit fatty cuts of red meat, fried foods, and processed meats, such as sausage and lunch meats.    ¨ Eat lots of fresh fruits and vegetables.  ¨ Choose whole grains, beans, pasta, potatoes, and cereals.    ¨ Use only small amounts of olive, corn, or canola oils.    ¨ Avoid butter, mayonnaise, shortening, or palm kernel oils.  ¨ Avoid foods with  trans fats.    ¨ Drink skim or nonfat milk and eat low-fat or nonfat yogurt and cheeses. Avoid whole milk, cream, ice cream, egg yolks, and full-fat cheeses.    ¨ Healthy desserts include angel food cake, ginger snaps, animal crackers, hard candy, popsicles, and low-fat or nonfat frozen yogurt. Avoid pastries, cakes, pies, and cookies.    · Exercise. Follow your exercise programs as directed by your health care provider.    ¨ A regular program helps decrease LDL and raise HDL.    ¨ A regular program helps with weight control.    ¨ Do things that increase your activity level like gardening, walking, or taking the stairs. Ask your health care provider about how you can be more active in your daily life.    · Medicine. Take medicine as directed by your health care provider.    ¨ Medicine may be prescribed by your health care provider to help lower cholesterol and decrease the risk for heart disease.    ¨ If you have several risk factors, you may need medicine even if your levels are normal.  Document Released: 09/03/2001 Document Revised: 12/14/2013 Document Reviewed: 09/22/2013  [employer]® Patient Information ©2015 [employer]. This information is not intended to replace advice given to you by your health care provider. Make sure you discuss any questions you have with your health   care provider.

## 2014-07-01 NOTE — Assessment & Plan Note (Signed)
Check uric acid level and xray, try topical treatments such as Aspercreme

## 2014-07-01 NOTE — Assessment & Plan Note (Signed)
Patient encouraged to maintain heart healthy diet, regular exercise, adequate sleep. Consider daily probiotics. Take medications as prescribed. Referred for screening colonoscopy

## 2014-07-01 NOTE — Assessment & Plan Note (Signed)
Well controlled, no changes to meds. Encouraged heart healthy diet such as the DASH diet and exercise as tolerated.  °

## 2014-07-01 NOTE — Assessment & Plan Note (Signed)
Has cut down considerably, is encouraged to stick to no more than 2-3 beverages daily

## 2014-07-01 NOTE — Assessment & Plan Note (Signed)
No cigarettes for several months.

## 2014-07-01 NOTE — Progress Notes (Signed)
Pre visit review using our clinic review tool, if applicable. No additional management support is needed unless otherwise documented below in the visit note. 

## 2014-07-02 LAB — TSH: TSH: 1.996 u[IU]/mL (ref 0.350–4.500)

## 2014-07-06 ENCOUNTER — Encounter: Payer: Self-pay | Admitting: Gastroenterology

## 2014-07-07 ENCOUNTER — Other Ambulatory Visit: Payer: Self-pay | Admitting: Family Medicine

## 2014-07-07 DIAGNOSIS — E785 Hyperlipidemia, unspecified: Secondary | ICD-10-CM

## 2014-07-18 ENCOUNTER — Ambulatory Visit (HOSPITAL_BASED_OUTPATIENT_CLINIC_OR_DEPARTMENT_OTHER)
Admission: RE | Admit: 2014-07-18 | Discharge: 2014-07-18 | Disposition: A | Discharge status: Home / Self Care | Payer: BC Managed Care – PPO | Source: Ambulatory Visit | Attending: Family Medicine | Admitting: Family Medicine

## 2014-07-18 ENCOUNTER — Encounter: Payer: Self-pay | Admitting: Gastroenterology

## 2014-07-18 ENCOUNTER — Telehealth: Payer: Self-pay | Admitting: *Deleted

## 2014-07-18 ENCOUNTER — Telehealth: Payer: Self-pay | Admitting: Pharmacist Clinician (PhC)/ Clinical Pharmacy Specialist

## 2014-07-18 ENCOUNTER — Other Ambulatory Visit: Payer: Self-pay | Admitting: Family Medicine

## 2014-07-18 ENCOUNTER — Ambulatory Visit (INDEPENDENT_AMBULATORY_CARE_PROVIDER_SITE_OTHER): Payer: BC Managed Care – PPO | Admitting: Gastroenterology

## 2014-07-18 ENCOUNTER — Telehealth: Payer: Self-pay | Admitting: Family Medicine

## 2014-07-18 VITALS — BP 134/78 | HR 92 | Ht 76.0 in | Wt 239.0 lb

## 2014-07-18 DIAGNOSIS — Z1211 Encounter for screening for malignant neoplasm of colon: Secondary | ICD-10-CM | POA: Insufficient documentation

## 2014-07-18 DIAGNOSIS — R52 Pain, unspecified: Secondary | ICD-10-CM | POA: Insufficient documentation

## 2014-07-18 DIAGNOSIS — Z7901 Long term (current) use of anticoagulants: Secondary | ICD-10-CM

## 2014-07-18 DIAGNOSIS — M79645 Pain in left finger(s): Secondary | ICD-10-CM

## 2014-07-18 DIAGNOSIS — M79609 Pain in unspecified limb: Secondary | ICD-10-CM | POA: Insufficient documentation

## 2014-07-18 MED ORDER — MOVIPREP 100 G PO SOLR
1.0000 | ORAL | Status: DC
Start: 2014-07-18 — End: 2014-08-02

## 2014-07-18 NOTE — Telephone Encounter (Signed)
Would need lovenox bridge while off coumadin. Marc Hansen

## 2014-07-18 NOTE — Progress Notes (Signed)
07/18/2014 Marc Hansen [id number] October 19, 1961   HISTORY OF PRESENT ILLNESS:  This is a pleasant 53 year old male who was referred by his PCP, Dr. Randel Pigg, to schedule a screening colonoscopy.  He is on coumadin for a stroke that he had about 14 months ago.  Has moderate to severe mitral regurgitation as well as atrial fibrillation.  TEE Sept 2014 showed EF 40-45%.  Denies any change in bowels, dark or bloody stools, abdominal pain, family history of colon cancer.  He has long-standing history of GERD and is on omeprazole 40 mg for this; says that he only takes it every 3-4 days but gets good control with taking it like this.   Past Medical History  Diagnosis Date  . Seasonal allergies   . Hypertension   . Overweight(278.02)   . Arthritis   . GERD (gastroesophageal reflux disease)   . Vitamin D deficiency 04/17/2011  . Hand crush injury 07/05/2011  . Umbilical hernia 0/25/8527  . Hyperlipidemia   . Atrial fibrillation 06/05/13    a. Dx 05/2013 in setting of CVA. Initially on Pradaxa, changed to Coumadin due to MR.  Marland Kitchen Abnormal liver function 08/20/2012  . Medial epicondylitis of right elbow 11/09/2012  . CVA (cerebral infarction) 06/05/13    a. 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 Also new afib. Carotid dopplers 0-39% bilat IC stenosis.   . Mitral regurgitation     a. Mod by TEE 08/30/13.  Marland Kitchen COPD (chronic obstructive pulmonary disease)   . Alcohol abuse   . Cardiomyopathy     a. EF 45-50% in 05/2013, 40-45% by TEE 08/2013.  Marland Kitchen Preventative health care 07/01/2014  . Pain of left thumb 07/01/2014   Past Surgical History  Procedure Laterality Date  . Vasectomy    . Carpal tunnel release      right, needed some nerve repair as well  . Right knee arthroscopy    . Tee without cardioversion N/A 08/30/2013    Procedure: TRANSESOPHAGEAL ECHOCARDIOGRAM (TEE);  Surgeon: Lelon Perla, MD;  Location: [facility] ENDOSCOPY;  Service: Cardiovascular;  Laterality: N/A;      reports that he has quit smoking. His smoking use included Cigarettes. He has a 6.5 pack-year smoking history. He has never used smokeless tobacco. He reports that he drinks about 29.4 ounces of alcohol per week. He reports that he uses illicit drugs. family history includes Cancer (age of onset: 41) in his mother; Fibromyalgia in his mother; Heart attack in his father; Heart disease in his maternal grandfather, maternal grandmother, and maternal uncle; Other in his father. Allergies  Allergen Reactions  . Atenolol     Felt contributing to dyspnea per Dr. Jacalyn Lefevre office note      Outpatient Encounter Prescriptions as of 07/18/2014  Medication Sig  . albuterol (PROVENTIL HFA;VENTOLIN HFA) 108 (90 BASE) MCG/ACT inhaler Inhale 1-2 puffs into the lungs every 6 (six) hours as needed for wheezing or shortness of breath.  Marland Kitchen albuterol (PROVENTIL) (2.5 MG/3ML) 0.083% nebulizer solution Take 2.5 mg by nebulization every 6 (six) hours as needed for wheezing or shortness of breath.  Marland Kitchen atorvastatin (LIPITOR) 40 MG tablet TAKE 1 TABLET BY MOUTH AT BEDTIME  . diltiazem (DILACOR XR) 240 MG 24 hr capsule Take 1 capsule (240 mg total) by mouth daily.  . folic acid (FOLVITE) 1 MG tablet Take 1 mg by mouth daily.  Marland Kitchen HYDROcodone-acetaminophen (NORCO/VICODIN) 5-325 MG per tablet Take 1 tablet by mouth every 6 (six)  hours as needed for moderate pain.  Marland Kitchen losartan (COZAAR) 50 MG tablet Take 1 tablet (50 mg total) by mouth daily.  Marland Kitchen MILK THISTLE PO Take 1 tablet by mouth daily.  . montelukast (SINGULAIR) 10 MG tablet Take 1 tablet (10 mg total) by mouth at bedtime as needed.  . Multiple Vitamin (MULTIVITAMIN WITH MINERALS) TABS tablet Take 1 tablet by mouth daily.  Marland Kitchen omeprazole (PRILOSEC) 40 MG capsule Take 1 capsule (40 mg total) by mouth daily.  . sildenafil (VIAGRA) 50 MG tablet Take 1 tablet (50 mg total) by mouth daily as needed for erectile dysfunction.  . thiamine (VITAMIN B-1) 100 MG tablet Take 100 mg  by mouth daily.  . traMADol (ULTRAM) 50 MG tablet Take 1 tablet (50 mg total) by mouth every 8 (eight) hours as needed.  . warfarin (COUMADIN) 5 MG tablet Take as directed by coumadin clinic     REVIEW OF SYSTEMS  : All other systems reviewed and negative except where noted in the History of Present Illness.   PHYSICAL EXAM: BP 134/78  Pulse 92  Ht 6\' 4"  (1.93 m)  Wt 239 lb (108.41 kg)  BMI 29.10 kg/m2 General: Well developed white male in no acute distress Head: Normocephalic and atraumatic Eyes:  Sclerae anicteric, conjunctiva pink. Ears: Normal auditory acuity. Lungs: Clear throughout to auscultation Heart: Regular rate and rhythm Abdomen: Soft, non-distended.  Normal bowel sounds.  Non-tender. Rectal:  Deferred. Will be done at the time of colonoscopy. Musculoskeletal: Symmetrical with no gross deformities  Skin: No lesions on visible extremities Extremities: No edema  Neurological: Alert oriented x 4, grossly non-focal Psychological:  Alert and cooperative. Normal mood and affect  ASSESSMENT AND PLAN: -Screening colonoscopy:  Will schedule with Dr. Ardis Hughs.  The risks, benefits, and alternatives were discussed with the patient and he consents to proceed.  The risks benefits and alternatives to a temporary hold of anti-coagulants/anti-platelets for the procedure were discussed with the patient he consents to proceed. Obtain clearance from Dr. Stanford Breed.

## 2014-07-18 NOTE — Telephone Encounter (Signed)
Requesting to have hand xray today, was talked about in last office vist

## 2014-07-18 NOTE — Telephone Encounter (Signed)
07/18/2014   RE: Marc Hansen DOB: [birth year] MRN: [id number]   Dear Dr. Kirk Ruths,    We have scheduled the above patient for an endoscopic procedure. Our records show that he is on anticoagulation therapy.   Please advise as to how long the patient may come off his therapy of Dr. Samella Parr prior to the procedure, which is scheduled for 08-02-2014.  Please fax back/ or route the completed form to [facility] at [phone].   Sincerely,    Amy Esterwood PA-C     Marisue Humble CMA

## 2014-07-18 NOTE — Telephone Encounter (Signed)
I called [facility] at [phone] to speak to [facility] at the Coumadin clinic. I had LM with Zigmund Daniel for Leeper to call me please.

## 2014-07-18 NOTE — Telephone Encounter (Signed)
Did not mean to close order, still needs xray order

## 2014-07-18 NOTE — Progress Notes (Signed)
I agree with the plan above.  Colon cancer screening with colonoscopy off coumadin for 5 days if OK with his cardiologist.

## 2014-07-18 NOTE — Patient Instructions (Signed)
You have been scheduled for a colonoscopy. Please follow written instructions given to you at your visit today.  Please pick up your prep kit at the pharmacy within the next 1-3 days. [facility] [place]  If you use inhalers (even only as needed), please bring them with you on the day of your procedure. Your physician has requested that you go to www.startemmi.com and enter the access code given to you at your visit today. This web site gives a general overview about your procedure. However, you should still follow specific instructions given to you by our office regarding your preparation for the procedure.

## 2014-07-18 NOTE — Telephone Encounter (Signed)
Pam with [facility] called and states that per Dr. Stanford Breed this patient will need Lovenox bridge prior to his colonoscopy on 08/02/14.  Please call her.

## 2014-07-19 ENCOUNTER — Encounter: Payer: Self-pay | Admitting: Gastroenterology

## 2014-07-19 NOTE — Telephone Encounter (Signed)
Spoke with patient, set him up for past due INR check, would prefer to stay at [facility] location.  Appt 8/3 to check INR and set up lovenox bridge for 8/11 colonoscopy.

## 2014-07-25 ENCOUNTER — Ambulatory Visit (INDEPENDENT_AMBULATORY_CARE_PROVIDER_SITE_OTHER): Payer: BC Managed Care – PPO | Admitting: Pharmacist

## 2014-07-25 DIAGNOSIS — Z5181 Encounter for therapeutic drug level monitoring: Secondary | ICD-10-CM

## 2014-07-25 DIAGNOSIS — Z7901 Long term (current) use of anticoagulants: Secondary | ICD-10-CM

## 2014-07-25 DIAGNOSIS — I4891 Unspecified atrial fibrillation: Secondary | ICD-10-CM

## 2014-07-25 LAB — POCT INR: INR: 3.7

## 2014-07-25 MED ORDER — ENOXAPARIN SODIUM 100 MG/ML ~~LOC~~ SOLN: 100.0000 mg | Freq: Two times a day (BID) | SUBCUTANEOUS | Status: DC

## 2014-07-25 NOTE — Patient Instructions (Addendum)
8/3- Hold Coumadin 8/4- Coumadin 1 1/2 tablets  8/5- Coumadin 2 tablets  8/6- Coumadin 1 1/2 tablets  8/7- Hold Coumadin 8/8- Start Lovenox 100mg  in PM 8/9- Lovenox 100mg  in AM and PM 8/10- Lovenox 100mg  in AM and PM 8/11- Day of Procedure.  When okay with Dr. Ardis Hughs, restart Coumadin AND Lovenox.  Take Coumadin 2 tablets in PM 8/12- Lovenox 100mg  in AM and PM AND Coumadin 2 1/2 tablets  8/13- Lovenox 100mg  in AM and PM AND Coumadin 2 tablets 8/14- Recheck INR

## 2014-08-02 ENCOUNTER — Encounter: Payer: Self-pay | Admitting: Gastroenterology

## 2014-08-02 ENCOUNTER — Other Ambulatory Visit: Payer: Self-pay

## 2014-08-02 ENCOUNTER — Ambulatory Visit (AMBULATORY_SURGERY_CENTER): Payer: BC Managed Care – PPO | Admitting: Gastroenterology

## 2014-08-02 VITALS — BP 131/80 | HR 83 | Temp 96.6°F | Resp 15 | Ht 76.0 in | Wt 239.0 lb

## 2014-08-02 DIAGNOSIS — Z1211 Encounter for screening for malignant neoplasm of colon: Secondary | ICD-10-CM

## 2014-08-02 DIAGNOSIS — D126 Benign neoplasm of colon, unspecified: Secondary | ICD-10-CM

## 2014-08-02 DIAGNOSIS — M25562 Pain in left knee: Secondary | ICD-10-CM

## 2014-08-02 MED ORDER — SODIUM CHLORIDE 0.9 % IV SOLN: 500.0000 mL | INTRAVENOUS | Status: DC

## 2014-08-02 MED ORDER — HYDROCODONE-ACETAMINOPHEN 5-325 MG PO TABS: 1.0000 | ORAL_TABLET | Freq: Four times a day (QID) | ORAL | Status: DC | PRN

## 2014-08-02 NOTE — Telephone Encounter (Signed)
Pt left a message stating that he needs a refill on his hydrocodone.  Last RX done on 07-01-14 quantity 60 with 0 refills  rx printed for md to sign and patient informed.   RX will be put in box for the front

## 2014-08-02 NOTE — Progress Notes (Signed)
Called to room to assist during endoscopic procedure.  Patient ID and intended procedure confirmed with present staff. Received instructions for my participation in the procedure from the performing physician.  

## 2014-08-02 NOTE — Patient Instructions (Signed)
YOU HAD AN ENDOSCOPIC PROCEDURE TODAY AT THE [facility]: Refer to the procedure report that was given to you for any specific questions about what was found during the examination.  If the procedure report does not answer your questions, please call your gastroenterologist to clarify.  If you requested that your care partner not be given the details of your procedure findings, then the procedure report has been included in a sealed envelope for you to review at your convenience later.  YOU SHOULD EXPECT: Some feelings of bloating in the abdomen. Passage of more gas than usual.  Walking can help get rid of the air that was put into your GI tract during the procedure and reduce the bloating. If you had a lower endoscopy (such as a colonoscopy or flexible sigmoidoscopy) you may notice spotting of blood in your stool or on the toilet paper. If you underwent a bowel prep for your procedure, then you may not have a normal bowel movement for a few days.  DIET: Your first meal following the procedure should be a light meal and then it is ok to progress to your normal diet.  A half-sandwich or bowl of soup is an example of a good first meal.  Heavy or fried foods are harder to digest and may make you feel nauseous or bloated.  Likewise meals heavy in dairy and vegetables can cause extra gas to form and this can also increase the bloating.  Drink plenty of fluids but you should avoid alcoholic beverages for 24 hours.  ACTIVITY: Your care partner should take you home directly after the procedure.  You should plan to take it easy, moving slowly for the rest of the day.  You can resume normal activity the day after the procedure however you should NOT DRIVE or use heavy machinery for 24 hours (because of the sedation medicines used during the test).    SYMPTOMS TO REPORT IMMEDIATELY: A gastroenterologist can be reached at any hour.  During normal business hours, 8:30 AM to 5:00 PM Monday through Friday,  call [phone].  After hours and on weekends, please call the GI answering service at [phone] who will take a message and have the physician on call contact you.   Following lower endoscopy (colonoscopy or flexible sigmoidoscopy):  Excessive amounts of blood in the stool  Significant tenderness or worsening of abdominal pains  Swelling of the abdomen that is new, acute  Fever of 100F or higher    FOLLOW UP: If any biopsies were taken you will be contacted by phone or by letter within the next 1-3 weeks.  Call your gastroenterologist if you have not heard about the biopsies in 3 weeks.  Our staff will call the home number listed on your records the next business day following your procedure to check on you and address any questions or concerns that you may have at that time regarding the information given to you following your procedure. This is a courtesy call and so if there is no answer at the home number and we have not heard from you through the emergency physician on call, we will assume that you have returned to your regular daily activities without incident.  SIGNATURES/CONFIDENTIALITY: You and/or your care partner have signed paperwork which will be entered into your electronic medical record.  These signatures attest to the fact that that the information above on your After Visit Summary has been reviewed and is understood.  Full responsibility of the confidentiality   of this discharge information lies with you and/or your care-partner.   INFORMATION ON POLYPS GIVEN TO YOU TODAY  OK TO RESTART COUMADIN TODAY PER DR JACOBS

## 2014-08-02 NOTE — Progress Notes (Signed)
Report to PACU, RN, vss, BBS= Clear.  

## 2014-08-02 NOTE — Op Note (Signed)
[place]  [address]. [place], [id number]   COLONOSCOPY PROCEDURE REPORT  PATIENT: Marc Hansen, Marc Hansen  MR#: [id number] BIRTHDATE: June 04, 1961 , 53  yrs. old GENDER: Male ENDOSCOPIST: Milus Banister, MD REFERRED ZD:GLOVF Charlett Blake, M.D. PROCEDURE DATE:  08/02/2014 PROCEDURE:   Colonoscopy with snare polypectomy First Screening Colonoscopy - Avg.  risk and is 50 yrs.  old or older Yes.  Prior Negative Screening - Now for repeat screening. N/A  History of Adenoma - Now for follow-up colonoscopy & has been > or = to 3 yrs.  N/A  Polyps Removed Today? Yes. ASA CLASS:   Class II INDICATIONS:average risk screening. MEDICATIONS: MAC sedation, administered by CRNA and propofol (Diprivan) 400mg  IV  DESCRIPTION OF PROCEDURE:   After the risks benefits and alternatives of the procedure were thoroughly explained, informed consent was obtained.  A digital rectal exam revealed no abnormalities of the rectum.   The [id number]  endoscope was introduced through the anus and advanced to the cecum, which was identified by both the appendix and ileocecal valve. No adverse events experienced.   The quality of the prep was good.  The instrument was then slowly withdrawn as the colon was fully examined.  COLON FINDINGS: Four polyps were found, removed and sent to pathology.  These were all sessile, ranged in size from 4-34mm, located in transverse, descending and rectal segments, all were removed with cold snare.  The examination was othewrise normal. Retroflexed views revealed no abnormalities. The time to cecum=1 minutes 51 seconds.  Withdrawal time=9 minutes 31 seconds.  The scope was withdrawn and the procedure completed. COMPLICATIONS: There were no complications.  ENDOSCOPIC IMPRESSION: Four polyps were found, removed and sent to pathology. The examination was othewrise normal.  RECOMMENDATIONS: 1. If the polyp(s) removed today are proven to be  adenomatous (pre-cancerous) polyps, you will need a colonoscopy in 3 years. Otherwise you should continue to follow colorectal cancer screening guidelines for "routine risk" patients with a colonoscopy in 10 years.  You will receive a letter within 1-2 weeks with the results of your biopsy as well as final recommendations.  Please call my office if you have not received a letter after 3 weeks. 2. Please restart your coumadin today.  eSigned:  Milus Banister, MD 08/02/2014 11:12 AM

## 2014-08-03 ENCOUNTER — Telehealth: Payer: Self-pay | Admitting: *Deleted

## 2014-08-03 ENCOUNTER — Encounter: Payer: BC Managed Care – PPO | Admitting: Gastroenterology

## 2014-08-03 NOTE — Telephone Encounter (Signed)
  Follow up Call-  Call back number 08/02/2014  Post procedure Call Back phone  # [phone]  Permission to leave phone message Yes     Patient questions:  Do you have a fever, pain , or abdominal swelling? No. Pain Score  0 *  Have you tolerated food without any problems? Yes.    Have you been able to return to your normal activities? Yes.    Do you have any questions about your discharge instructions: Diet   No. Medications  No. Follow up visit  No.  Do you have questions or concerns about your Care? No.  Actions: * If pain score is 4 or above: No action needed, pain <4.

## 2014-08-05 ENCOUNTER — Ambulatory Visit (INDEPENDENT_AMBULATORY_CARE_PROVIDER_SITE_OTHER): Payer: BC Managed Care – PPO | Admitting: *Deleted

## 2014-08-05 DIAGNOSIS — Z7901 Long term (current) use of anticoagulants: Secondary | ICD-10-CM

## 2014-08-05 DIAGNOSIS — I4891 Unspecified atrial fibrillation: Secondary | ICD-10-CM

## 2014-08-05 DIAGNOSIS — Z5181 Encounter for therapeutic drug level monitoring: Secondary | ICD-10-CM

## 2014-08-05 LAB — POCT INR: INR: 1.5

## 2014-08-06 IMAGING — CT CT HEAD W/O CM
1 of 2 series · 13 of 30 positions shown, 17 images · non-contrast
Comparison: None.

CLINICAL DATA: Transient right arm weakness and facial weakness.
New onset reading and spelling difficulty.

CT HEAD WITHOUT CONTRAST
TECHNIQUE: Contiguous axial images were obtained from the base of
the skull through the vertex without contrast.

[Series 2: brain · axial · 0.47mm/px · z∈[+122,+290]mm · 13 of 36 slices shown, 17 images]
[im 3/36  brain]
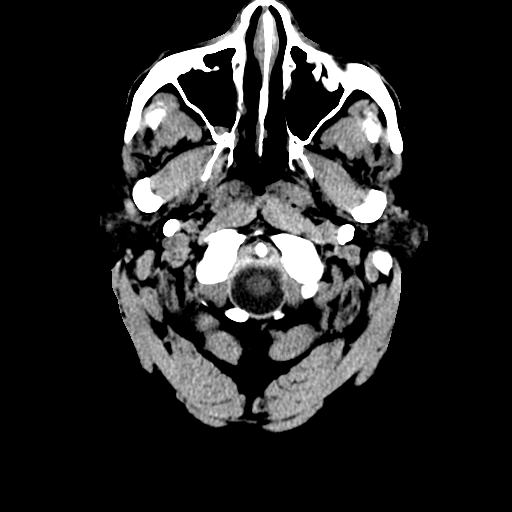
[im 3/36  bone]
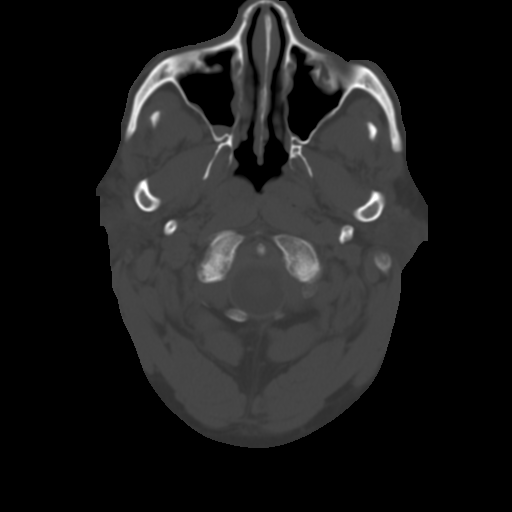
[im 6/36  brain]
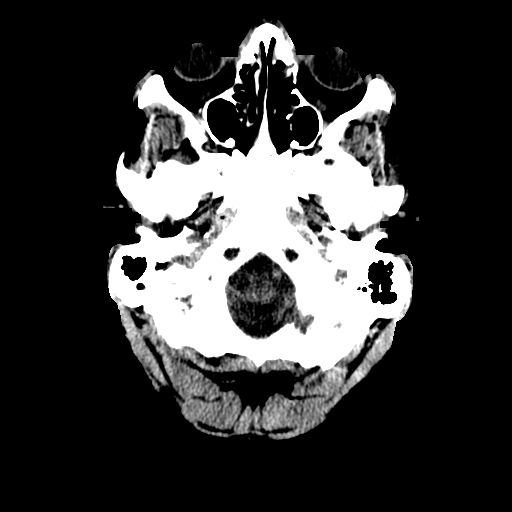
[im 8/36  brain]
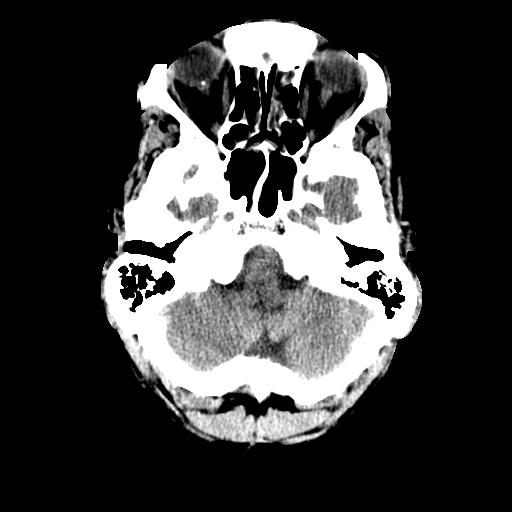
[im 11/36  brain]
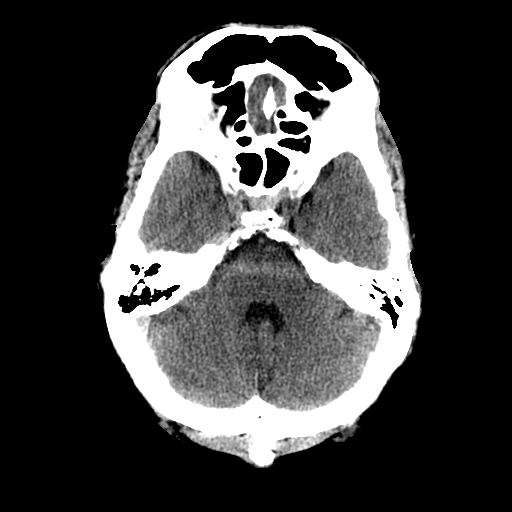
[im 13/36  brain]
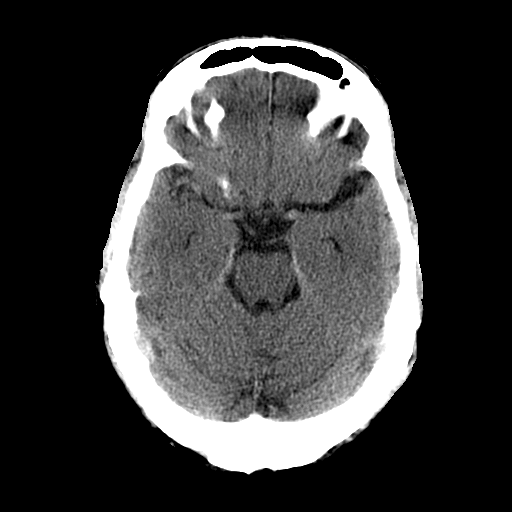
[im 13/36  bone]
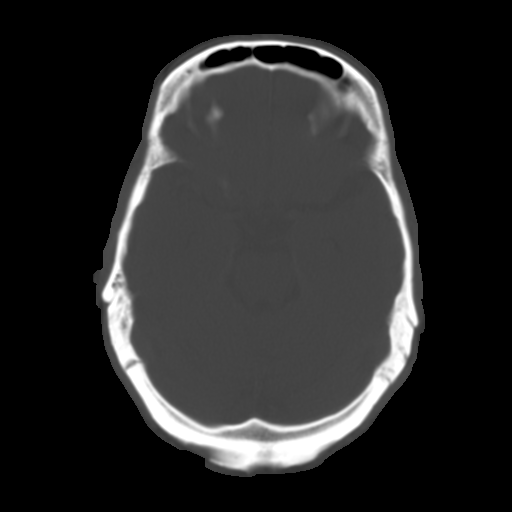
[im 16/36  brain]
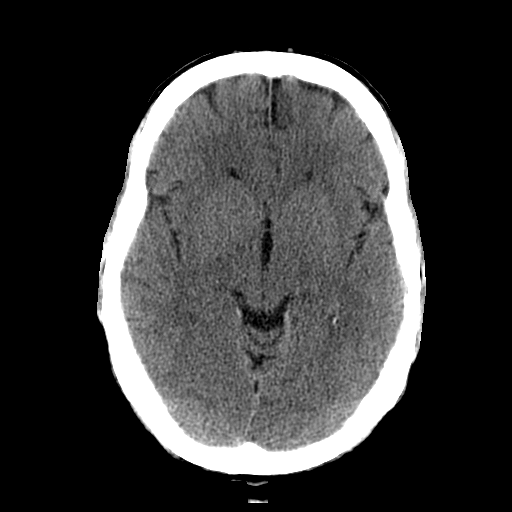
[im 18/36  brain]
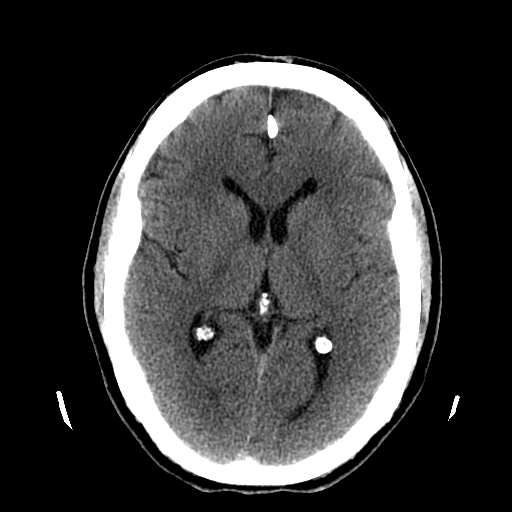
[im 21/36  brain]
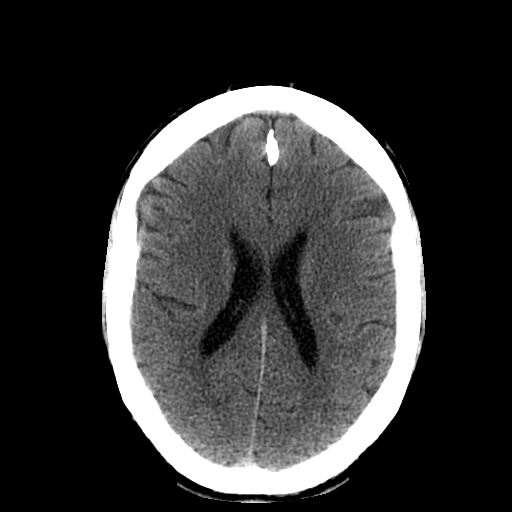
[im 23/36  brain]
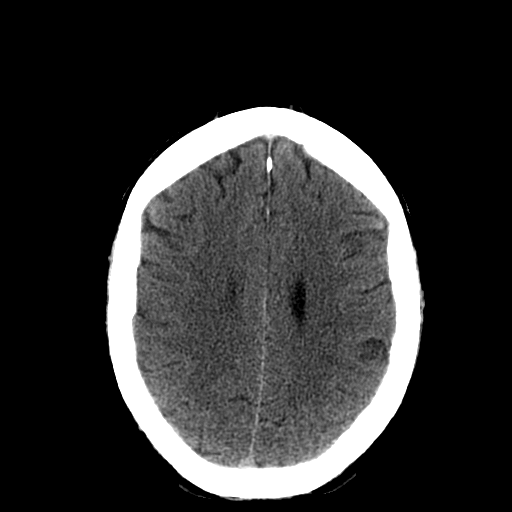
[im 23/36  bone]
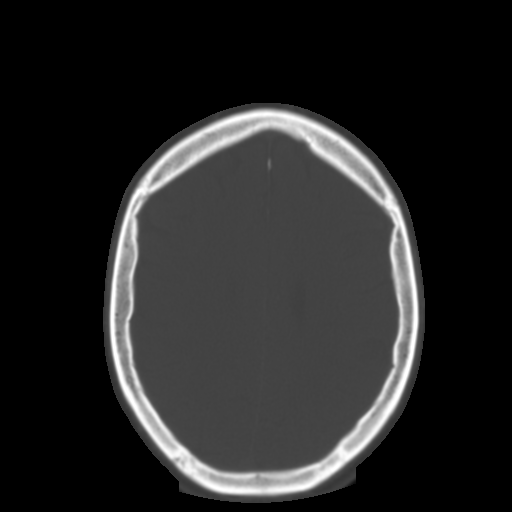
[im 26/36  brain]
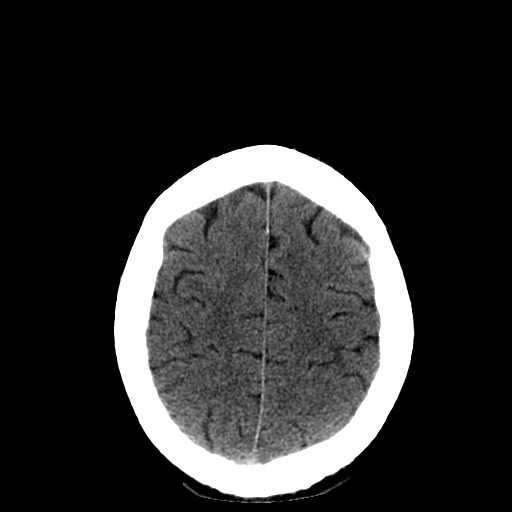
[im 28/36  brain]
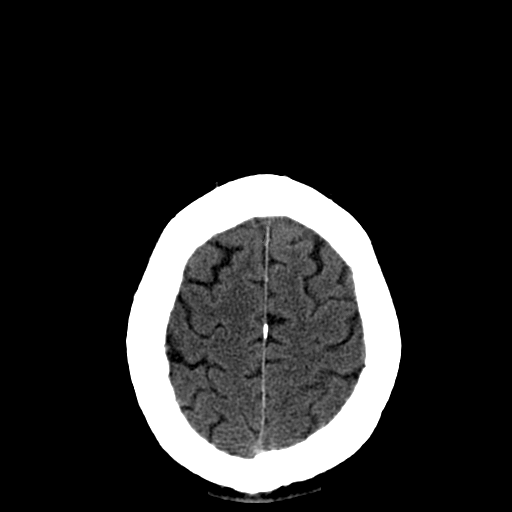
[im 31/36  brain]
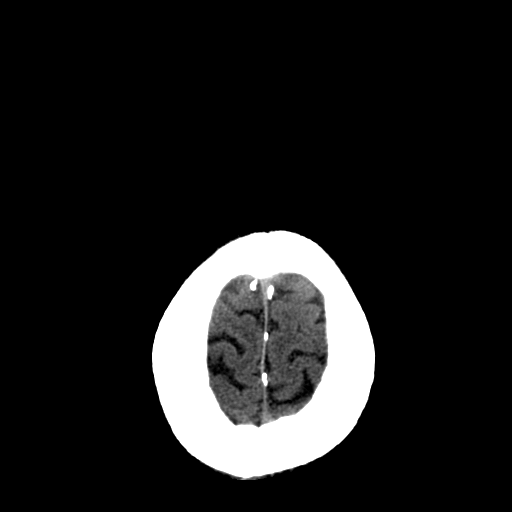
[im 33/36  brain]
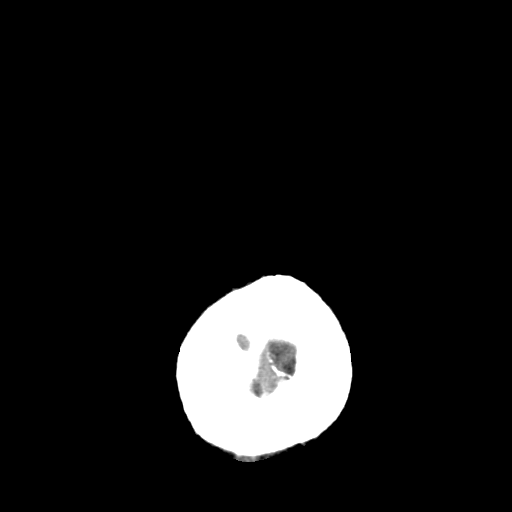
[im 33/36  bone]
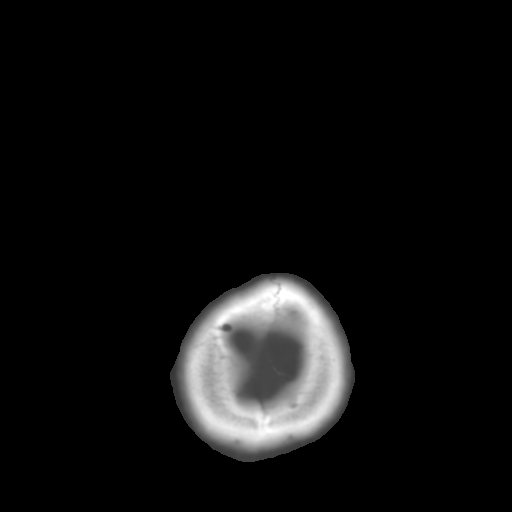

[13 of 30 positions shown; findings below may reference images not displayed]

FINDINGS: Minimally enlarged ventricles and subarachnoid spaces.
Minimal patchy white matter low density in both cerebral
hemispheres.  No intracranial hemorrhage, mass lesion or CT
evidence of acute infarction.  Unremarkable bones.  Mucosal
thickening involving all of the sinuses as well as a large left
maxillary sinus retention cyst.  Small calcification in the
posterior right globe at the optic nerve.
IMPRESSION: 1.  No acute abnormality.
2.  Minimal atrophy and minimal chronic small vessel white matter
ischemic changes in both cerebral hemispheres.
3.  Mild chronic pansinusitis.
4.  Tiny posterior globe calcification on the right at the optic
nerve

These results were called by telephone on 06/05/2013 at 9911 hours
to Dr. Atif, who verbally acknowledged these results.

## 2014-08-08 ENCOUNTER — Ambulatory Visit (INDEPENDENT_AMBULATORY_CARE_PROVIDER_SITE_OTHER): Payer: BC Managed Care – PPO

## 2014-08-08 DIAGNOSIS — Z5181 Encounter for therapeutic drug level monitoring: Secondary | ICD-10-CM

## 2014-08-08 DIAGNOSIS — I4891 Unspecified atrial fibrillation: Secondary | ICD-10-CM

## 2014-08-08 DIAGNOSIS — Z7901 Long term (current) use of anticoagulants: Secondary | ICD-10-CM

## 2014-08-08 LAB — POCT INR: INR: 1.9

## 2014-08-10 ENCOUNTER — Encounter: Payer: Self-pay | Admitting: Gastroenterology

## 2014-08-22 ENCOUNTER — Ambulatory Visit (INDEPENDENT_AMBULATORY_CARE_PROVIDER_SITE_OTHER): Payer: BC Managed Care – PPO | Admitting: Pharmacist

## 2014-08-22 DIAGNOSIS — I4891 Unspecified atrial fibrillation: Secondary | ICD-10-CM

## 2014-08-22 DIAGNOSIS — Z5181 Encounter for therapeutic drug level monitoring: Secondary | ICD-10-CM

## 2014-08-22 DIAGNOSIS — Z7901 Long term (current) use of anticoagulants: Secondary | ICD-10-CM

## 2014-08-22 LAB — POCT INR: INR: 2.8

## 2014-08-22 MED ORDER — WARFARIN SODIUM 5 MG PO TABS: ORAL_TABLET | ORAL | Status: DC

## 2014-08-25 ENCOUNTER — Telehealth: Payer: Self-pay | Admitting: Family Medicine

## 2014-08-25 NOTE — Telephone Encounter (Signed)
Left a detailed message on v/m

## 2014-08-25 NOTE — Telephone Encounter (Signed)
Yes the 60 is considered a 30 day supply but if he is having increased pain it can be adjusted but this requires an appt.

## 2014-08-25 NOTE — Telephone Encounter (Signed)
Caller name: Curt Relation to pt: self Call back number: [phone] Pharmacy:  Reason for call:  Patient is requesting a new hydrocodone rx. He states that it is okay to leave a detailed message.

## 2014-08-25 NOTE — Telephone Encounter (Signed)
Please advise? Last RX was done on 08-02-14 quantity 60 with 0 refills  Is the quantity 60 a 30 day supply?

## 2014-08-31 ENCOUNTER — Telehealth: Payer: Self-pay | Admitting: Family Medicine

## 2014-08-31 DIAGNOSIS — M25562 Pain in left knee: Secondary | ICD-10-CM

## 2014-08-31 MED ORDER — HYDROCODONE-ACETAMINOPHEN 5-325 MG PO TABS: 1.0000 | ORAL_TABLET | Freq: Four times a day (QID) | ORAL | Status: DC | PRN

## 2014-08-31 NOTE — Telephone Encounter (Signed)
Caller name: Jareb Relation to pt: self Call back number: [phone] Pharmacy:  Reason for call:   Patient is requesting a new hydrocodone rx. He states that he will pick up rx tomorrow

## 2014-08-31 NOTE — Telephone Encounter (Signed)
Last RX was done on 08-02-14 quantity 60 with 0 refills  Rx printed for md to sign and put up front  Didn't call patient due to him stating in previous message he would be here tomorrow

## 2014-09-02 ENCOUNTER — Other Ambulatory Visit: Payer: Self-pay

## 2014-09-02 DIAGNOSIS — J45909 Unspecified asthma, uncomplicated: Secondary | ICD-10-CM

## 2014-09-02 MED ORDER — ALBUTEROL SULFATE HFA 108 (90 BASE) MCG/ACT IN AERS: 1.0000 | INHALATION_SPRAY | Freq: Four times a day (QID) | RESPIRATORY_TRACT | Status: AC | PRN

## 2014-09-19 ENCOUNTER — Ambulatory Visit (INDEPENDENT_AMBULATORY_CARE_PROVIDER_SITE_OTHER): Payer: BC Managed Care – PPO | Admitting: *Deleted

## 2014-09-19 DIAGNOSIS — Z5181 Encounter for therapeutic drug level monitoring: Secondary | ICD-10-CM

## 2014-09-19 DIAGNOSIS — I4891 Unspecified atrial fibrillation: Secondary | ICD-10-CM

## 2014-09-19 DIAGNOSIS — Z7901 Long term (current) use of anticoagulants: Secondary | ICD-10-CM

## 2014-09-19 LAB — POCT INR: INR: 3.5

## 2014-09-30 ENCOUNTER — Telehealth: Payer: Self-pay

## 2014-09-30 DIAGNOSIS — M25562 Pain in left knee: Secondary | ICD-10-CM

## 2014-09-30 MED ORDER — HYDROCODONE-ACETAMINOPHEN 5-325 MG PO TABS: 1.0000 | ORAL_TABLET | Freq: Four times a day (QID) | ORAL | Status: DC | PRN

## 2014-09-30 NOTE — Telephone Encounter (Signed)
Marc Hansen [id number]  [name] called to say he needs a refill HYDROcodone-acetaminophen (NORCO/VICODIN) 5-325 MG per tablet. Call when ready to be picked up.

## 2014-09-30 NOTE — Telephone Encounter (Signed)
Last rx was done on 08-31-14 quantity 60 with 0 refills  RX printed for md to sign and pt informed that rx is ready to be picked up

## 2014-10-04 ENCOUNTER — Ambulatory Visit (INDEPENDENT_AMBULATORY_CARE_PROVIDER_SITE_OTHER): Payer: BC Managed Care – PPO

## 2014-10-04 DIAGNOSIS — I4891 Unspecified atrial fibrillation: Secondary | ICD-10-CM

## 2014-10-04 DIAGNOSIS — Z7901 Long term (current) use of anticoagulants: Secondary | ICD-10-CM

## 2014-10-04 DIAGNOSIS — Z5181 Encounter for therapeutic drug level monitoring: Secondary | ICD-10-CM

## 2014-10-04 LAB — POCT INR: INR: 3.3

## 2014-10-24 ENCOUNTER — Ambulatory Visit (INDEPENDENT_AMBULATORY_CARE_PROVIDER_SITE_OTHER): Payer: BC Managed Care – PPO

## 2014-10-24 DIAGNOSIS — I4891 Unspecified atrial fibrillation: Secondary | ICD-10-CM

## 2014-10-24 DIAGNOSIS — Z5181 Encounter for therapeutic drug level monitoring: Secondary | ICD-10-CM

## 2014-10-24 DIAGNOSIS — Z7901 Long term (current) use of anticoagulants: Secondary | ICD-10-CM

## 2014-10-24 LAB — POCT INR: INR: 2.4

## 2014-10-31 ENCOUNTER — Telehealth: Payer: Self-pay | Admitting: Family Medicine

## 2014-10-31 DIAGNOSIS — M25562 Pain in left knee: Secondary | ICD-10-CM

## 2014-10-31 MED ORDER — HYDROCODONE-ACETAMINOPHEN 5-325 MG PO TABS: 1.0000 | ORAL_TABLET | Freq: Four times a day (QID) | ORAL | Status: DC | PRN

## 2014-10-31 NOTE — Telephone Encounter (Signed)
Last RX was done on 09-30-14 quantity 60 with 0 refills  RX printed for md to sign and pt informed

## 2014-10-31 NOTE — Telephone Encounter (Signed)
Written rx for hydrocodone

## 2014-11-25 ENCOUNTER — Ambulatory Visit (INDEPENDENT_AMBULATORY_CARE_PROVIDER_SITE_OTHER): Payer: BC Managed Care – PPO | Admitting: *Deleted

## 2014-11-25 DIAGNOSIS — I4891 Unspecified atrial fibrillation: Secondary | ICD-10-CM

## 2014-11-25 DIAGNOSIS — Z5181 Encounter for therapeutic drug level monitoring: Secondary | ICD-10-CM

## 2014-11-25 DIAGNOSIS — Z7901 Long term (current) use of anticoagulants: Secondary | ICD-10-CM

## 2014-11-25 LAB — POCT INR: INR: 2.7

## 2014-11-29 ENCOUNTER — Telehealth: Payer: Self-pay | Admitting: Family Medicine

## 2014-11-29 DIAGNOSIS — M25562 Pain in left knee: Secondary | ICD-10-CM

## 2014-11-29 MED ORDER — HYDROCODONE-ACETAMINOPHEN 5-325 MG PO TABS: 1.0000 | ORAL_TABLET | Freq: Four times a day (QID) | ORAL | Status: DC | PRN

## 2014-11-29 NOTE — Telephone Encounter (Signed)
Caller name: Lamark Relation to pt: self Call back number: [phone] Pharmacy:  Reason for call:   Patient requesting a new hydrocodone rx

## 2014-11-29 NOTE — Telephone Encounter (Signed)
Please inform pt this will be ready to pick up this afternoon   RX printed for md to sign

## 2014-11-29 NOTE — Telephone Encounter (Signed)
Informed patient of this.  °

## 2014-12-26 ENCOUNTER — Ambulatory Visit (INDEPENDENT_AMBULATORY_CARE_PROVIDER_SITE_OTHER): Payer: BLUE CROSS/BLUE SHIELD | Admitting: *Deleted

## 2014-12-26 DIAGNOSIS — I4891 Unspecified atrial fibrillation: Secondary | ICD-10-CM

## 2014-12-26 DIAGNOSIS — Z5181 Encounter for therapeutic drug level monitoring: Secondary | ICD-10-CM

## 2014-12-26 DIAGNOSIS — Z7901 Long term (current) use of anticoagulants: Secondary | ICD-10-CM

## 2014-12-26 LAB — POCT INR: INR: 3.6

## 2014-12-27 ENCOUNTER — Telehealth: Payer: Self-pay | Admitting: Family Medicine

## 2014-12-27 DIAGNOSIS — M25562 Pain in left knee: Secondary | ICD-10-CM

## 2014-12-27 NOTE — Telephone Encounter (Signed)
Last OV 07-01-14 Hydrocodone last filled 11/29/14 #60 with 0

## 2014-12-27 NOTE — Telephone Encounter (Signed)
Caller name: Donaciano, Range Relation to pt: self  Call back number: [phone]   Reason for call:  pt requesting a HYDROcodone-acetaminophen (NORCO/VICODIN) 5-325 MG per tablet. Pt is aware he is not due until 12/31/13 and would like to pick up RX Thursday or  Friday

## 2014-12-28 NOTE — Telephone Encounter (Signed)
OK to refill Hydrocodone, same strength, same sig, same number

## 2014-12-29 MED ORDER — HYDROCODONE-ACETAMINOPHEN 5-325 MG PO TABS: 1.0000 | ORAL_TABLET | Freq: Four times a day (QID) | ORAL | Status: DC | PRN

## 2014-12-29 NOTE — Telephone Encounter (Signed)
Rx printed and placed in Dr. Blyth's red folder for review and signature.   

## 2014-12-29 NOTE — Telephone Encounter (Signed)
Called and spoke with the pt and informed him that his rx was approved and will be left up front for to him to pick up.  Pt agreed.//AB/CMA

## 2015-01-02 ENCOUNTER — Encounter: Payer: Self-pay | Admitting: Family Medicine

## 2015-01-02 ENCOUNTER — Ambulatory Visit (INDEPENDENT_AMBULATORY_CARE_PROVIDER_SITE_OTHER): Payer: BLUE CROSS/BLUE SHIELD | Admitting: Family Medicine

## 2015-01-02 VITALS — BP 119/90 | HR 92 | Temp 97.5°F | Ht 76.0 in | Wt 241.0 lb

## 2015-01-02 DIAGNOSIS — E785 Hyperlipidemia, unspecified: Secondary | ICD-10-CM

## 2015-01-02 DIAGNOSIS — I4891 Unspecified atrial fibrillation: Secondary | ICD-10-CM

## 2015-01-02 DIAGNOSIS — J302 Other seasonal allergic rhinitis: Secondary | ICD-10-CM

## 2015-01-02 DIAGNOSIS — I10 Essential (primary) hypertension: Secondary | ICD-10-CM

## 2015-01-02 DIAGNOSIS — R0683 Snoring: Secondary | ICD-10-CM

## 2015-01-02 DIAGNOSIS — R6882 Decreased libido: Secondary | ICD-10-CM

## 2015-01-02 DIAGNOSIS — R5382 Chronic fatigue, unspecified: Secondary | ICD-10-CM

## 2015-01-02 DIAGNOSIS — I639 Cerebral infarction, unspecified: Secondary | ICD-10-CM

## 2015-01-02 DIAGNOSIS — N529 Male erectile dysfunction, unspecified: Secondary | ICD-10-CM

## 2015-01-02 DIAGNOSIS — M25562 Pain in left knee: Secondary | ICD-10-CM

## 2015-01-02 DIAGNOSIS — J449 Chronic obstructive pulmonary disease, unspecified: Secondary | ICD-10-CM

## 2015-01-02 LAB — LIPID PANEL
Cholesterol: 171 mg/dL (ref 0–200)
HDL: 31 mg/dL — AB (ref >39.00)
LDL Cholesterol: 102 mg/dL — ABNORMAL HIGH (ref 0–99)
NONHDL: 140
Total CHOL/HDL Ratio: 6
Triglycerides: 189 mg/dL — ABNORMAL HIGH (ref 0.0–149.0)
VLDL: 37.8 mg/dL (ref 0.0–40.0)

## 2015-01-02 LAB — COMPREHENSIVE METABOLIC PANEL
ALT: 45 U/L (ref 0–53)
AST: 28 U/L (ref 0–37)
Albumin: 4.5 g/dL (ref 3.5–5.2)
Alkaline Phosphatase: 63 U/L (ref 39–117)
BUN: 20 mg/dL (ref 6–23)
CO2: 26 mEq/L (ref 19–32)
CREATININE: 1 mg/dL (ref 0.4–1.5)
Calcium: 9.6 mg/dL (ref 8.4–10.5)
Chloride: 103 mEq/L (ref 96–112)
GFR: 79.83 mL/min (ref >60.00)
GLUCOSE: 116 mg/dL — AB (ref 70–99)
Potassium: 4.7 mEq/L (ref 3.5–5.1)
Sodium: 138 mEq/L (ref 135–145)
Total Bilirubin: 0.5 mg/dL (ref 0.2–1.2)
Total Protein: 7.5 g/dL (ref 6.0–8.3)

## 2015-01-02 LAB — CBC
HEMATOCRIT: 44 % (ref 39.0–52.0)
HEMOGLOBIN: 14.7 g/dL (ref 13.0–17.0)
MCHC: 33.3 g/dL (ref 30.0–36.0)
MCV: 88.8 fl (ref 78.0–100.0)
Platelets: 274 10*3/uL (ref 150.0–400.0)
RBC: 4.95 Mil/uL (ref 4.22–5.81)
RDW: 13 % (ref 11.5–15.5)
WBC: 10 10*3/uL (ref 4.0–10.5)

## 2015-01-02 LAB — TSH: TSH: 2.35 u[IU]/mL (ref 0.35–4.50)

## 2015-01-02 LAB — TESTOSTERONE: Testosterone: 204.03 ng/dL — ABNORMAL LOW (ref 300.00–890.00)

## 2015-01-02 MED ORDER — TRAMADOL HCL 50 MG PO TABS: 50.0000 mg | ORAL_TABLET | Freq: Three times a day (TID) | ORAL | Status: DC | PRN

## 2015-01-02 NOTE — Progress Notes (Signed)
Marc Hansen  [id number] 1961-03-17 01/02/2015      Progress Note-Follow Up  Subjective  Chief Complaint  Chief Complaint  Patient presents with  . Follow-up    6 mos    HPI  Patient is a 54 y.o. male in today for routine medical care. Patient is in today for follow-up. Generally doing well. No recent illness or hospitalization. He is pleased with his response to say. Hehad much less congestion and no respiratory concerns since starting this medication. Is tolerating Coumadin and denies any concerns regarding bleeding. Denies CP/palp/SOB/HA/congestion/fevers/GI or GU c/o. Taking meds as prescribed  Past Medical History  Diagnosis Date  . Seasonal allergies   . Hypertension   . Overweight(278.02)   . Arthritis   . GERD (gastroesophageal reflux disease)   . Vitamin D deficiency 04/17/2011  . Hand crush injury 07/05/2011  . Umbilical hernia 01/19/7866  . Hyperlipidemia   . Atrial fibrillation 06/05/13    a. Dx 05/2013 in setting of CVA. Initially on Pradaxa, changed to Coumadin due to MR.  Marland Kitchen Abnormal liver function 08/20/2012  . Medial epicondylitis of right elbow 11/09/2012  . CVA (cerebral infarction) 06/05/13    a. 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 Also new afib. Carotid dopplers 0-39% bilat IC stenosis.   . Mitral regurgitation     a. Mod by TEE 08/30/13.  Marland Kitchen COPD (chronic obstructive pulmonary disease)   . Alcohol abuse   . Cardiomyopathy     a. EF 45-50% in 05/2013, 40-45% by TEE 08/2013.  Marland Kitchen Preventative health care 07/01/2014  . Pain of left thumb 07/01/2014    Past Surgical History  Procedure Laterality Date  . Vasectomy    . Carpal tunnel release      right, needed some nerve repair as well  . Right knee arthroscopy    . Tee without cardioversion N/A 08/30/2013    Procedure: TRANSESOPHAGEAL ECHOCARDIOGRAM (TEE);  Surgeon: Lelon Perla, MD;  Location: [facility] ENDOSCOPY;  Service: Cardiovascular;  Laterality: N/A;    Family History   Problem Relation Age of Onset  . Cancer Mother 38    breast- remission  . Fibromyalgia Mother   . Other Father     CHF- stent   . Heart attack Father   . Heart disease Maternal Uncle   . Heart disease Maternal Grandmother   . Heart disease Maternal Grandfather     History   Social History  . Marital Status: Married    Spouse Name: N/A    Number of Children: 3  . Years of Education: N/A   Occupational History  .     Social History Main Topics  . Smoking status: Former Smoker -- 0.25 packs/day for 26 years    Types: Cigarettes  . Smokeless tobacco: Never Used     Comment: uses a vap every now and then  . Alcohol Use: 29.4 oz/week    49 Cans of beer per week     Comment: 6-7 beers/daily, plus added cocktails on the weekend  . Drug Use: Yes     Comment: marijuana occasionally  . Sexual Activity:    Partners: Female   Other Topics Concern  . Not on file   Social History Narrative   Does work with Probation officer. Frequent travel.     Current Outpatient Prescriptions on File Prior to Visit  Medication Sig Dispense Refill  . albuterol (PROVENTIL HFA;VENTOLIN HFA) 108 (90 BASE) MCG/ACT inhaler Inhale 1-2 puffs into  the lungs every 6 (six) hours as needed for wheezing or shortness of breath. 18 g 2  . albuterol (PROVENTIL) (2.5 MG/3ML) 0.083% nebulizer solution Take 2.5 mg by nebulization every 6 (six) hours as needed for wheezing or shortness of breath.    Marland Kitchen atorvastatin (LIPITOR) 40 MG tablet TAKE 1 TABLET BY MOUTH AT BEDTIME 90 tablet 1  . diltiazem (DILACOR XR) 240 MG 24 hr capsule Take 1 capsule (240 mg total) by mouth daily. 90 capsule 2  . enoxaparin (LOVENOX) 100 MG/ML injection Inject 1 mL (100 mg total) into the skin every 12 (twelve) hours. 20 Syringe 0  . folic acid (FOLVITE) 1 MG tablet Take 1 mg by mouth daily.    Marland Kitchen HYDROcodone-acetaminophen (NORCO/VICODIN) 5-325 MG per tablet Take 1 tablet by mouth every 6 (six) hours as needed for moderate pain. 60  tablet 0  . losartan (COZAAR) 50 MG tablet Take 1 tablet (50 mg total) by mouth daily. 90 tablet 2  . MILK THISTLE PO Take 1 tablet by mouth daily.    . montelukast (SINGULAIR) 10 MG tablet Take 1 tablet (10 mg total) by mouth at bedtime as needed. 90 tablet 3  . Multiple Vitamin (MULTIVITAMIN WITH MINERALS) TABS tablet Take 1 tablet by mouth daily.    Marland Kitchen omeprazole (PRILOSEC) 40 MG capsule Take 1 capsule (40 mg total) by mouth daily. 90 capsule 3  . sildenafil (VIAGRA) 50 MG tablet Take 1 tablet (50 mg total) by mouth daily as needed for erectile dysfunction. 6 tablet 0  . thiamine (VITAMIN B-1) 100 MG tablet Take 100 mg by mouth daily.    . traMADol (ULTRAM) 50 MG tablet Take 1 tablet (50 mg total) by mouth every 8 (eight) hours as needed. 30 tablet 0  . warfarin (COUMADIN) 5 MG tablet Take as directed by coumadin clinic 180 tablet 1  . [DISCONTINUED] esomeprazole (NEXIUM) 40 MG capsule Take 1 capsule (40 mg total) by mouth daily. 30 capsule 5  . [DISCONTINUED] Fluticasone Propionate, Inhal, (FLOVENT DISKUS) 100 MCG/BLIST AEPB Prn  60 each 1  . [DISCONTINUED] loratadine (CLARITIN) 10 MG tablet Take 1 tablet (10 mg total) by mouth daily. 30 tablet 2   No current facility-administered medications on file prior to visit.    Allergies  Allergen Reactions  . Atenolol     Felt contributing to dyspnea per Dr. Jacalyn Lefevre office note    Review of Systems  Review of Systems  Constitutional: Negative for fever and malaise/fatigue.  HENT: Negative for congestion.   Eyes: Negative for discharge.  Respiratory: Negative for shortness of breath.   Cardiovascular: Negative for chest pain, palpitations and leg swelling.  Gastrointestinal: Negative for nausea, abdominal pain and diarrhea.  Genitourinary: Negative for dysuria.  Musculoskeletal: Positive for joint pain. Negative for falls.       Thumb pain left, no surgery just yet.  Skin: Negative for rash.  Neurological: Negative for loss of  consciousness and headaches.  Endo/Heme/Allergies: Negative for polydipsia.  Psychiatric/Behavioral: Negative for depression and suicidal ideas. The patient is not nervous/anxious and does not have insomnia.     Objective  BP 119/90 mmHg  Pulse 92  Temp(Src) 97.5 F (36.4 C) (Oral)  Ht 6\' 4"  (1.93 m)  Wt 241 lb (109.317 kg)  BMI 29.35 kg/m2  SpO2 96%  Physical Exam  Physical Exam  Lab Results  Component Value Date   TSH 1.996 07/01/2014   Lab Results  Component Value Date   WBC 8.9 07/01/2014  HGB 14.3 07/01/2014   HCT 40.9 07/01/2014   MCV 85.6 07/01/2014   PLT 344 07/01/2014   Lab Results  Component Value Date   CREATININE 1.09 07/01/2014   BUN 21 07/01/2014   NA 138 07/01/2014   K 4.4 07/01/2014   CL 103 07/01/2014   CO2 26 07/01/2014   Lab Results  Component Value Date   ALT 34 07/01/2014   AST 24 07/01/2014   ALKPHOS 85 07/01/2014   BILITOT 0.7 07/01/2014   Lab Results  Component Value Date   CHOL 166 07/01/2014   Lab Results  Component Value Date   HDL 40 07/01/2014   Lab Results  Component Value Date   LDLCALC 104* 07/01/2014   Lab Results  Component Value Date   TRIG 109 07/01/2014   Lab Results  Component Value Date   CHOLHDL 4.2 07/01/2014     Assessment & Plan  Atrial fibrillation Rate controlled, tolerating Coumadin   HTN (hypertension) Well controlled, no changes to meds. Encouraged heart healthy diet such as the DASH diet and exercise as tolerated.    Hyperlipidemia Tolerating statin, encouraged heart healthy diet, avoid trans fats, minimize simple carbs and saturated fats. Increase exercise as tolerated   Other seasonal allergic rhinitis Notes symptoms much better since starting singulair   CVA (cerebral infarction) No new concerns   COPD (chronic obstructive pulmonary disease) Improved symptoms with Singulair

## 2015-01-02 NOTE — Patient Instructions (Signed)

## 2015-01-02 NOTE — Progress Notes (Signed)
Pre visit review using our clinic review tool, if applicable. No additional management support is needed unless otherwise documented below in the visit note. 

## 2015-01-03 ENCOUNTER — Other Ambulatory Visit (INDEPENDENT_AMBULATORY_CARE_PROVIDER_SITE_OTHER): Payer: BLUE CROSS/BLUE SHIELD

## 2015-01-03 DIAGNOSIS — R739 Hyperglycemia, unspecified: Secondary | ICD-10-CM

## 2015-01-03 LAB — HEMOGLOBIN A1C: HEMOGLOBIN A1C: 6.5 % (ref 4.6–6.5)

## 2015-01-08 NOTE — Assessment & Plan Note (Signed)
Rate controlled, tolerating Coumadin 

## 2015-01-08 NOTE — Assessment & Plan Note (Signed)
Improved symptoms with Singulair

## 2015-01-08 NOTE — Assessment & Plan Note (Signed)
Well controlled, no changes to meds. Encouraged heart healthy diet such as the DASH diet and exercise as tolerated.  °

## 2015-01-08 NOTE — Assessment & Plan Note (Signed)
No new concerns.  

## 2015-01-08 NOTE — Assessment & Plan Note (Signed)
Notes symptoms much better since starting singulair

## 2015-01-08 NOTE — Assessment & Plan Note (Signed)
Tolerating statin, encouraged heart healthy diet, avoid trans fats, minimize simple carbs and saturated fats. Increase exercise as tolerated 

## 2015-01-23 ENCOUNTER — Ambulatory Visit: Payer: BLUE CROSS/BLUE SHIELD | Admitting: Family Medicine

## 2015-01-30 ENCOUNTER — Telehealth: Payer: Self-pay | Admitting: Family Medicine

## 2015-01-30 DIAGNOSIS — M25562 Pain in left knee: Secondary | ICD-10-CM

## 2015-01-30 NOTE — Telephone Encounter (Signed)
Caller name: Relation to pt: Call back number: Pharmacy:  Reason for call:  Pt requesting a refill HYDROcodone-acetaminophen (NORCO/VICODIN) 5-325 MG per tablet

## 2015-01-30 NOTE — Telephone Encounter (Signed)
Ok to refill Hydrocodone, needs uds and contract,can do at his next visit or now if he is here. Same strength, same sig same number

## 2015-01-30 NOTE — Telephone Encounter (Signed)
Requesting:HYDROcodone-acetaminophen (NORCO/VICODIN) 5-325 MG per tablet Contract No UDS No Last OV 01/02/2015 Last Refill 12/29/2014 #60 no refills  Please Advise

## 2015-01-30 NOTE — Telephone Encounter (Signed)
Closed in error.

## 2015-01-30 NOTE — Telephone Encounter (Signed)
Patient calling in regarding this stating that he will be out of town tomorrow and is requesting this to be done by this afternoon

## 2015-01-31 MED ORDER — HYDROCODONE-ACETAMINOPHEN 5-325 MG PO TABS: 1.0000 | ORAL_TABLET | Freq: Four times a day (QID) | ORAL | Status: DC | PRN

## 2015-01-31 NOTE — Telephone Encounter (Signed)
Printed script per PCP instructions.  Called the patient informed hardcopy of hydrocodone is ready for pickup at the front desk.. Did inform of PCP instructions regarding uds and contact.. The patient did verbalize understanding.

## 2015-01-31 NOTE — Addendum Note (Signed)
Addended by: Sharon Seller B on: 01/31/2015 07:23 AM   Modules accepted: Orders

## 2015-02-22 ENCOUNTER — Other Ambulatory Visit: Payer: Self-pay | Admitting: Internal Medicine

## 2015-02-24 ENCOUNTER — Other Ambulatory Visit: Payer: Self-pay | Admitting: Family Medicine

## 2015-02-24 ENCOUNTER — Telehealth: Payer: Self-pay

## 2015-02-24 MED ORDER — WARFARIN SODIUM 5 MG PO TABS: ORAL_TABLET | ORAL | Status: DC

## 2015-02-24 NOTE — Telephone Encounter (Signed)
Printed prescription as PCP instructed and is on counter for signature.  Called the paitient to inform regarding info. On contract and UDS (left msg. To call back)

## 2015-02-24 NOTE — Telephone Encounter (Signed)
Refill sent to pharmacy.   

## 2015-02-24 NOTE — Telephone Encounter (Signed)
Faxed hardcopy for Tramad[facility] to [facility].  Did speak to the patient and informed him that at next refill for this medication would need controlled med. Contract signed as well as urine UDS.  The patient agreed to do so and stated could do so at his next appt. On  04/03/15.

## 2015-02-24 NOTE — Telephone Encounter (Signed)
OK to refill the Tramadol with same strength, same sig, same number, I will sign but warn him we need a drug contract and a UDS if he has not already done it by the next prescription if he is unable to do this time

## 2015-02-27 ENCOUNTER — Ambulatory Visit (INDEPENDENT_AMBULATORY_CARE_PROVIDER_SITE_OTHER): Payer: BLUE CROSS/BLUE SHIELD

## 2015-02-27 DIAGNOSIS — Z5181 Encounter for therapeutic drug level monitoring: Secondary | ICD-10-CM

## 2015-02-27 DIAGNOSIS — Z7901 Long term (current) use of anticoagulants: Secondary | ICD-10-CM

## 2015-02-27 DIAGNOSIS — I4891 Unspecified atrial fibrillation: Secondary | ICD-10-CM

## 2015-02-27 LAB — POCT INR: INR: 2.4

## 2015-03-04 ENCOUNTER — Other Ambulatory Visit: Payer: Self-pay | Admitting: Family Medicine

## 2015-03-24 ENCOUNTER — Encounter: Payer: Self-pay | Admitting: Family Medicine

## 2015-03-24 ENCOUNTER — Telehealth: Payer: Self-pay | Admitting: Family Medicine

## 2015-03-24 DIAGNOSIS — M25562 Pain in left knee: Secondary | ICD-10-CM

## 2015-03-24 MED ORDER — HYDROCODONE-ACETAMINOPHEN 5-325 MG PO TABS: 1.0000 | ORAL_TABLET | Freq: Four times a day (QID) | ORAL | Status: DC | PRN

## 2015-03-24 NOTE — Telephone Encounter (Signed)
Caller name: Reuben, [place] Relation to pt: self  Call back number: [phone]   Reason for call:  Pt states he is in pain regarding hes thumb and requesting a refill of hydrocodone to hold him over until next appointment with MD on 04/03/15. Pt states he has no problem given a UDS. Please advise

## 2015-03-24 NOTE — Telephone Encounter (Signed)
Called the patient informed to pickup hardcopy at the front desk.  Informed also would need to sign a contract and do UDS then can get prescription.  The patient did verbalize understanding and agreed to do so.

## 2015-03-24 NOTE — Telephone Encounter (Signed)
Advise on request in Dr. Frederik Pear absence Last refill for hydrocodone 01/31/15 #60 with 0 refills Next appointment with PCP is 04/03/15

## 2015-03-24 NOTE — Telephone Encounter (Signed)
Will grant refill in her absence.  It is ready for pickup but he needs to sign a contract at pick up with our practice per office policy.

## 2015-03-24 DEATH — deceased

## 2015-03-29 ENCOUNTER — Other Ambulatory Visit: Payer: Self-pay | Admitting: Family Medicine

## 2015-03-31 ENCOUNTER — Encounter: Payer: Self-pay | Admitting: Family Medicine

## 2015-03-31 ENCOUNTER — Ambulatory Visit (INDEPENDENT_AMBULATORY_CARE_PROVIDER_SITE_OTHER): Payer: BLUE CROSS/BLUE SHIELD | Admitting: Family Medicine

## 2015-03-31 VITALS — BP 132/78 | HR 79 | Temp 98.1°F | Ht 76.0 in | Wt 235.3 lb

## 2015-03-31 DIAGNOSIS — J309 Allergic rhinitis, unspecified: Secondary | ICD-10-CM | POA: Diagnosis not present

## 2015-03-31 DIAGNOSIS — E291 Testicular hypofunction: Secondary | ICD-10-CM

## 2015-03-31 DIAGNOSIS — I10 Essential (primary) hypertension: Secondary | ICD-10-CM

## 2015-03-31 DIAGNOSIS — E785 Hyperlipidemia, unspecified: Secondary | ICD-10-CM

## 2015-03-31 DIAGNOSIS — I4891 Unspecified atrial fibrillation: Secondary | ICD-10-CM | POA: Diagnosis not present

## 2015-03-31 DIAGNOSIS — R739 Hyperglycemia, unspecified: Secondary | ICD-10-CM

## 2015-03-31 DIAGNOSIS — R7989 Other specified abnormal findings of blood chemistry: Secondary | ICD-10-CM

## 2015-03-31 DIAGNOSIS — E782 Mixed hyperlipidemia: Secondary | ICD-10-CM

## 2015-03-31 DIAGNOSIS — R109 Unspecified abdominal pain: Secondary | ICD-10-CM

## 2015-03-31 DIAGNOSIS — R52 Pain, unspecified: Secondary | ICD-10-CM

## 2015-03-31 LAB — LIPID PANEL
CHOLESTEROL: 206 mg/dL — AB (ref 0–200)
HDL: 32 mg/dL — ABNORMAL LOW (ref >=40)
TRIGLYCERIDES: 404 mg/dL — AB (ref <150)
Total CHOL/HDL Ratio: 6.4 Ratio

## 2015-03-31 LAB — COMPREHENSIVE METABOLIC PANEL
ALK PHOS: 66 U/L (ref 39–117)
ALT: 39 U/L (ref 0–53)
AST: 27 U/L (ref 0–37)
Albumin: 4.6 g/dL (ref 3.5–5.2)
BUN: 22 mg/dL (ref 6–23)
CALCIUM: 9.8 mg/dL (ref 8.4–10.5)
CO2: 23 meq/L (ref 19–32)
Chloride: 104 mEq/L (ref 96–112)
Creat: 0.99 mg/dL (ref 0.50–1.35)
GLUCOSE: 91 mg/dL (ref 70–99)
POTASSIUM: 4.1 meq/L (ref 3.5–5.3)
Sodium: 140 mEq/L (ref 135–145)
TOTAL PROTEIN: 7 g/dL (ref 6.0–8.3)
Total Bilirubin: 0.5 mg/dL (ref 0.2–1.2)

## 2015-03-31 MED ORDER — TRAMADOL HCL 50 MG PO TABS: 50.0000 mg | ORAL_TABLET | Freq: Three times a day (TID) | ORAL | Status: DC | PRN

## 2015-03-31 MED ORDER — FLUTICASONE PROPIONATE 50 MCG/ACT NA SUSP: 2.0000 | Freq: Every day | NASAL | Status: AC

## 2015-03-31 MED ORDER — CETIRIZINE HCL 10 MG PO TABS: 10.0000 mg | ORAL_TABLET | Freq: Every day | ORAL | Status: AC

## 2015-03-31 MED ORDER — ALBUTEROL SULFATE HFA 108 (90 BASE) MCG/ACT IN AERS: 2.0000 | INHALATION_SPRAY | Freq: Four times a day (QID) | RESPIRATORY_TRACT | Status: AC | PRN

## 2015-03-31 NOTE — Patient Instructions (Signed)

## 2015-03-31 NOTE — Progress Notes (Signed)
Pre visit review using our clinic review tool, if applicable. No additional management support is needed unless otherwise documented below in the visit note. 

## 2015-04-01 LAB — TESTOSTERONE: Testosterone: 258 ng/dL — ABNORMAL LOW (ref 300–890)

## 2015-04-01 LAB — PROTIME-INR
INR: 2.51 — ABNORMAL HIGH (ref <1.50)
PROTHROMBIN TIME: 27.1 s — AB (ref 11.6–15.2)

## 2015-04-03 ENCOUNTER — Ambulatory Visit: Payer: BLUE CROSS/BLUE SHIELD | Admitting: Family Medicine

## 2015-04-09 ENCOUNTER — Encounter: Payer: Self-pay | Admitting: Family Medicine

## 2015-04-09 DIAGNOSIS — R739 Hyperglycemia, unspecified: Secondary | ICD-10-CM | POA: Insufficient documentation

## 2015-04-09 DIAGNOSIS — R7989 Other specified abnormal findings of blood chemistry: Secondary | ICD-10-CM | POA: Insufficient documentation

## 2015-04-09 DIAGNOSIS — R52 Pain, unspecified: Secondary | ICD-10-CM | POA: Insufficient documentation

## 2015-04-09 NOTE — Progress Notes (Signed)
Marc Hansen  [id number] [birth year] 04/09/2015      Progress Note-Follow Up  Subjective  Chief Complaint  Chief Complaint  Patient presents with  . Follow-up    Routine    HPI  Patient is a 54 y.o. male in today for routine medical care.Well controlled, no changes to meds. Encouraged heart healthy diet such as the DASH diet and exercise as tolerated.  Patient is in today for follow-up feeling generally well. Continues to travel a great deal for his work. No recent illness. Allergies have flared slightly but medications are helpful.   Past Medical History  Diagnosis Date  . Seasonal allergies   . Hypertension   . Overweight(278.02)   . Arthritis   . GERD (gastroesophageal reflux disease)   . Vitamin D deficiency 04/17/2011  . Hand crush injury 07/05/2011  . Umbilical hernia 3/97/6734  . Hyperlipidemia   . Atrial fibrillation 06/05/13    a. Dx 05/2013 in setting of CVA. Initially on Pradaxa, changed to Coumadin due to MR.  Marland Kitchen Abnormal liver function 08/20/2012  . Medial epicondylitis of right elbow 11/09/2012  . CVA (cerebral infarction) 06/05/13    a. 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 Also new afib. Carotid dopplers 0-39% bilat IC stenosis.   . Mitral regurgitation     a. Mod by TEE 08/30/13.  Marland Kitchen COPD (chronic obstructive pulmonary disease)   . Alcohol abuse   . Cardiomyopathy     a. EF 45-50% in 05/2013, 40-45% by TEE 08/2013.  Marland Kitchen Preventative health care 07/01/2014  . Pain of left thumb 07/01/2014    Past Surgical History  Procedure Laterality Date  . Vasectomy    . Carpal tunnel release      right, needed some nerve repair as well  . Right knee arthroscopy    . Tee without cardioversion N/A 08/30/2013    Procedure: TRANSESOPHAGEAL ECHOCARDIOGRAM (TEE);  Surgeon: Lelon Perla, MD;  Location: [facility] ENDOSCOPY;  Service: Cardiovascular;  Laterality: N/A;    Family History  Problem Relation Age of Onset  . Cancer Mother 44    breast-  remission  . Fibromyalgia Mother   . Other Father     CHF- stent   . Heart attack Father   . Heart disease Maternal Uncle   . Heart disease Maternal Grandmother   . Heart disease Maternal Grandfather     History   Social History  . Marital Status: Married    Spouse Name: N/A  . Number of Children: 3  . Years of Education: N/A   Occupational History  .     Social History Main Topics  . Smoking status: Former Smoker -- 0.25 packs/day for 26 years    Types: Cigarettes  . Smokeless tobacco: Never Used     Comment: uses a vap every now and then  . Alcohol Use: 29.4 oz/week    49 Cans of beer per week     Comment: 6-7 beers/daily, plus added cocktails on the weekend  . Drug Use: Yes     Comment: marijuana occasionally  . Sexual Activity:    Partners: Female   Other Topics Concern  . Not on file   Social History Narrative   Does work with Probation officer. Frequent travel.     Current Outpatient Prescriptions on File Prior to Visit  Medication Sig Dispense Refill  . albuterol (PROVENTIL HFA;VENTOLIN HFA) 108 (90 BASE) MCG/ACT inhaler Inhale 1-2 puffs into the lungs every 6 (six)  hours as needed for wheezing or shortness of breath. 18 g 2  . albuterol (PROVENTIL) (2.5 MG/3ML) 0.083% nebulizer solution Take 2.5 mg by nebulization every 6 (six) hours as needed for wheezing or shortness of breath.    Marland Kitchen atorvastatin (LIPITOR) 40 MG tablet TAKE 1 TABLET BY MOUTH AT BEDTIME 90 tablet 1  . diltiazem (CARDIZEM CD) 240 MG 24 hr capsule Take 1 capsule (240 mg total) by mouth daily. 90 capsule 1  . folic acid (FOLVITE) 1 MG tablet Take 1 mg by mouth daily.    Marland Kitchen HYDROcodone-acetaminophen (NORCO/VICODIN) 5-325 MG per tablet Take 1 tablet by mouth every 6 (six) hours as needed for moderate pain. 60 tablet 0  . losartan (COZAAR) 50 MG tablet Take 1 tablet (50 mg total) by mouth daily. 90 tablet 2  . MILK THISTLE PO Take 1 tablet by mouth daily.    . montelukast (SINGULAIR) 10 MG  tablet Take 1 tablet (10 mg total) by mouth at bedtime as needed. 90 tablet 3  . Multiple Vitamin (MULTIVITAMIN WITH MINERALS) TABS tablet Take 1 tablet by mouth daily.    Marland Kitchen omeprazole (PRILOSEC) 40 MG capsule Take 1 capsule (40 mg total) by mouth daily. 90 capsule 3  . sildenafil (VIAGRA) 50 MG tablet Take 1 tablet (50 mg total) by mouth daily as needed for erectile dysfunction. 6 tablet 0  . thiamine (VITAMIN B-1) 100 MG tablet Take 100 mg by mouth daily.    Marland Kitchen warfarin (COUMADIN) 5 MG tablet Take as directed by coumadin clinic 180 tablet 0  . [DISCONTINUED] esomeprazole (NEXIUM) 40 MG capsule Take 1 capsule (40 mg total) by mouth daily. 30 capsule 5  . [DISCONTINUED] Fluticasone Propionate, Inhal, (FLOVENT DISKUS) 100 MCG/BLIST AEPB Prn  60 each 1  . [DISCONTINUED] loratadine (CLARITIN) 10 MG tablet Take 1 tablet (10 mg total) by mouth daily. 30 tablet 2   No current facility-administered medications on file prior to visit.    Allergies  Allergen Reactions  . Atenolol     Felt contributing to dyspnea per Dr. Jacalyn Lefevre office note    Review of Systems  Review of Systems  Constitutional: Negative for fever and malaise/fatigue.  HENT: Negative for congestion.   Eyes: Negative for discharge.  Respiratory: Negative for shortness of breath.   Cardiovascular: Negative for chest pain, palpitations and leg swelling.  Gastrointestinal: Negative for nausea, abdominal pain and diarrhea.  Genitourinary: Negative for dysuria.  Musculoskeletal: Negative for falls.  Skin: Negative for rash.  Neurological: Negative for loss of consciousness and headaches.  Endo/Heme/Allergies: Negative for polydipsia.  Psychiatric/Behavioral: Negative for depression and suicidal ideas. The patient is not nervous/anxious and does not have insomnia.     Objective  BP 132/78 mmHg  Pulse 79  Temp(Src) 98.1 F (36.7 C) (Oral)  Ht 6\' 4"  (1.93 m)  Wt 235 lb 5 oz (106.737 kg)  BMI 28.65 kg/m2  SpO2  97%  Physical Exam  Physical Exam  Constitutional: He is oriented to person, place, and time and well-developed, well-nourished, and in no distress. No distress.  HENT:  Head: Normocephalic and atraumatic.  Eyes: Conjunctivae are normal.  Neck: Neck supple. No thyromegaly present.  Cardiovascular: Normal rate, regular rhythm and normal heart sounds.   No murmur heard. Pulmonary/Chest: Effort normal and breath sounds normal. No respiratory distress.  Abdominal: He exhibits no distension and no mass. There is no tenderness.  Musculoskeletal: He exhibits no edema.  Neurological: He is alert and oriented to person, place, and time.  Skin: Skin is warm.  Psychiatric: Memory, affect and judgment normal.    Lab Results  Component Value Date   TSH 2.35 01/02/2015   Lab Results  Component Value Date   WBC 10.0 01/02/2015   HGB 14.7 01/02/2015   HCT 44.0 01/02/2015   MCV 88.8 01/02/2015   PLT 274.0 01/02/2015   Lab Results  Component Value Date   CREATININE 0.99 03/31/2015   BUN 22 03/31/2015   NA 140 03/31/2015   K 4.1 03/31/2015   CL 104 03/31/2015   CO2 23 03/31/2015   Lab Results  Component Value Date   ALT 39 03/31/2015   AST 27 03/31/2015   ALKPHOS 66 03/31/2015   BILITOT 0.5 03/31/2015   Lab Results  Component Value Date   CHOL 206* 03/31/2015   Lab Results  Component Value Date   HDL 32* 03/31/2015   Lab Results  Component Value Date   LDLCALC NOT CALC 03/31/2015   Lab Results  Component Value Date   TRIG 404* 03/31/2015   Lab Results  Component Value Date   CHOLHDL 6.4 03/31/2015     Assessment & Plan  HTN (hypertension) Well controlled, no changes to meds. Encouraged heart healthy diet such as the DASH diet and exercise as tolerated.    Hyperlipidemia Tolerating statin, encouraged heart healthy diet, avoid trans fats, minimize simple carbs and saturated fats. Increase exercise as tolerated   Hyperglycemia hgba1c acceptable, minimize  simple carbs. Increase exercise as tolerated.    Low testosterone Improved some, encouraged increased exercise and minimize pain meds   Pain Encouraged moist heat and gentle stretching as tolerated. May try NSAIDs and prescription meds as directed and report if symptoms worsen or seek immediate care

## 2015-04-09 NOTE — Assessment & Plan Note (Signed)
Encouraged moist heat and gentle stretching as tolerated. May try NSAIDs and prescription meds as directed and report if symptoms worsen or seek immediate care 

## 2015-04-09 NOTE — Assessment & Plan Note (Signed)
Tolerating statin, encouraged heart healthy diet, avoid trans fats, minimize simple carbs and saturated fats. Increase exercise as tolerated 

## 2015-04-09 NOTE — Assessment & Plan Note (Signed)
Well controlled, no changes to meds. Encouraged heart healthy diet such as the DASH diet and exercise as tolerated.  °

## 2015-04-09 NOTE — Assessment & Plan Note (Signed)
Improved some, encouraged increased exercise and minimize pain meds

## 2015-04-09 NOTE — Assessment & Plan Note (Signed)
hgba1c acceptable, minimize simple carbs. Increase exercise as tolerated.  

## 2015-04-24 ENCOUNTER — Ambulatory Visit: Payer: BLUE CROSS/BLUE SHIELD | Admitting: Family Medicine

## 2015-04-26 ENCOUNTER — Telehealth: Payer: Self-pay | Admitting: Family Medicine

## 2015-04-26 DIAGNOSIS — K921 Melena: Secondary | ICD-10-CM

## 2015-04-26 NOTE — Telephone Encounter (Signed)
Relation to DT:PNSQ  Call back number: [phone] Pharmacy: [facility], [place] [phone] (Phone) [phone] (Fax)         Reason for call:  Pt requesting a refill cholesterol medication. Does not know the name of medication, pt states its a new RX

## 2015-04-27 MED ORDER — ATORVASTATIN CALCIUM 40 MG PO TABS
40.0000 mg | ORAL_TABLET | Freq: Every day | ORAL | Status: DC
Start: 2015-04-27 — End: 2015-05-03

## 2015-04-27 NOTE — Telephone Encounter (Signed)
If BRBPR continues he needs to be seen for now he needs an IFOB and a CBC and please send in a 30 days supply of his lipitor with 5 rf or 90 day supply with 1 refill at his discretion

## 2015-04-27 NOTE — Telephone Encounter (Signed)
Cholesterol refill to [facility].  You increase his Lipitor per lab results to taking 40 mg 1 and 1/2 (60mg ) daily.  Advise please. Also the patient states he had bright red blood in his stool this morning.  Patient states he is on coumadin.

## 2015-04-27 NOTE — Telephone Encounter (Signed)
Patient informed of PCP instructions.  Sent in prescripiton, mailed IFOB, and put order in at [facility] for [place].  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  He is out of town and unable to come in to our office at this time.

## 2015-05-01 ENCOUNTER — Telehealth: Payer: Self-pay | Admitting: Family Medicine

## 2015-05-01 DIAGNOSIS — M25562 Pain in left knee: Secondary | ICD-10-CM

## 2015-05-01 MED ORDER — HYDROCODONE-ACETAMINOPHEN 5-325 MG PO TABS: 1.0000 | ORAL_TABLET | Freq: Four times a day (QID) | ORAL | Status: DC | PRN

## 2015-05-01 NOTE — Telephone Encounter (Signed)
Printed and on counter for signature. 

## 2015-05-01 NOTE — Telephone Encounter (Signed)
Called the patient informed to pickup hardcopy at the front desk. 

## 2015-05-01 NOTE — Telephone Encounter (Signed)
Relation to pt: self  Call back number: [phone]   Reason for call:  Pt requesting a refill   HYDROcodone-acetaminophen (NORCO/VICODIN) 5-325 MG per tablet

## 2015-05-01 NOTE — Telephone Encounter (Signed)
Ok to refill Hydrocodone, same sig, same number, same strength

## 2015-05-03 ENCOUNTER — Other Ambulatory Visit: Payer: Self-pay | Admitting: Family Medicine

## 2015-05-04 ENCOUNTER — Telehealth: Payer: Self-pay | Admitting: Family Medicine

## 2015-05-04 ENCOUNTER — Encounter: Payer: Self-pay | Admitting: Family Medicine

## 2015-05-04 NOTE — Telephone Encounter (Signed)
Patient called back. Prior to talking to [facility]. Informed him that we need to send a note to Dr. Charlett Blake before we can schedule coumadin appointment. Patient started yelling and states that is "just stupid". He states that he did schedule a coumadin check at [facility] for 05/08/15.   Patient also became very upset that he received a $1000 bill from [facility]. He says that is ridiculous to pay that amount to "pee in a cup". I did give him the phone # to [facility]. Patient disconnected phone call.

## 2015-05-04 NOTE — Telephone Encounter (Signed)
I have written a dismissal letter. Please proceed with rest of dismissal process.

## 2015-05-04 NOTE — Telephone Encounter (Signed)
Caller name: Dewon, [place] Relation to pt: self  Call back number: [phone]   Reason for call:  Pt was extremely upset and stated "to hell with yall" due to the fact MD is requiring pt to have a drug screen due to the fact he is taking a controlled substance. Pt stated insurance advised him he does not have to pee in a cup which will cost him $900+. Pt wanted to schedule a coumadin appointment and stated he will go back to [facility].

## 2015-05-04 NOTE — Telephone Encounter (Signed)
Patient has cancelled the last 3 appts to see PCP. Advised scheduler that he would need to be seen by PCP to follow up and to discuss getting his coumadin checked here. Patient proceeded to yell at the front staff. Patient has scheduled an "establish coumadin" with [facility].

## 2015-05-05 ENCOUNTER — Telehealth: Payer: Self-pay | Admitting: Family Medicine

## 2015-05-05 NOTE — Telephone Encounter (Signed)
Patient dismissed  

## 2015-05-05 NOTE — Telephone Encounter (Signed)
Patient dismissed from [facility] by Penni Homans, M.D. effective May 04, 2015. Dismissal letter sent out by certified / registered mail on May 08, 2015. [place]

## 2015-05-09 ENCOUNTER — Ambulatory Visit (INDEPENDENT_AMBULATORY_CARE_PROVIDER_SITE_OTHER): Payer: BLUE CROSS/BLUE SHIELD | Admitting: *Deleted

## 2015-05-09 DIAGNOSIS — I4891 Unspecified atrial fibrillation: Secondary | ICD-10-CM | POA: Diagnosis not present

## 2015-05-09 DIAGNOSIS — Z5181 Encounter for therapeutic drug level monitoring: Secondary | ICD-10-CM | POA: Diagnosis not present

## 2015-05-09 DIAGNOSIS — Z7901 Long term (current) use of anticoagulants: Secondary | ICD-10-CM

## 2015-05-09 LAB — POCT INR: INR: 2.9

## 2015-05-21 ENCOUNTER — Other Ambulatory Visit: Payer: Self-pay | Admitting: Cardiology

## 2015-05-24 ENCOUNTER — Telehealth: Payer: Self-pay | Admitting: Family Medicine

## 2015-05-24 NOTE — Telephone Encounter (Signed)
Caller name: Daeshawn Relation to pt: self  Call back number: [phone] Pharmacy:  Reason for call:   I don't think that patient has received dismissal letter.   Patient called in stating that the pharmacy called him asking why his wife was filling a "dead mans" rx? Pharmacy told him that the Dept of Health and Human services has him deceased. Patient is wanting to know what he can do about this? He says that the pharmacy told him that this could affect him getting further rx.

## 2015-05-25 NOTE — Telephone Encounter (Signed)
This gentleman was very mean to the staff so I dismissed him last month, now he is calling so we need to figure out if he got his dismissal letter. I do not have any advice for him about what the pharmacy told him I have never heard of this before.

## 2015-05-28 ENCOUNTER — Other Ambulatory Visit: Payer: Self-pay | Admitting: Family Medicine

## 2015-05-28 NOTE — Telephone Encounter (Signed)
I have written this gentleman a 3 month supply of his Losartan so he does not run out but he was dismissed for being very rude to the staff but there is some question if he received the letter. Please confirm he received his dismissal letter. Thanks

## 2015-05-29 NOTE — Telephone Encounter (Addendum)
Certified dismissal letter returned as undeliverable, unclaimed, return to sender on May 29, 2015 after three attempts by [employer]. Letter placed in another envelope and resent as 1st class mail on May 30, 2015 which does not require a signature. [place]

## 2015-06-01 ENCOUNTER — Telehealth: Payer: Self-pay | Admitting: Family Medicine

## 2015-06-01 DIAGNOSIS — R52 Pain, unspecified: Secondary | ICD-10-CM

## 2015-06-01 DIAGNOSIS — M25562 Pain in left knee: Secondary | ICD-10-CM

## 2015-06-01 NOTE — Telephone Encounter (Signed)
°  Relation to pt: self  Call back number: [phone]    Reason for call:  Pt requesting a refill HYDROcodone-acetaminophen and traMADol (ULTRAM) 50 MG tablet

## 2015-06-01 NOTE — Telephone Encounter (Signed)
ADVISE ON REFILL AS THIS PATIENTS DISMISSAL LETTER WAS DATED 05/04/15 AND STATED YOU WILL PROVIDE EMERGENCY CARE FOR 30 DAYS.  OK TO REFILL????  Requesting: HYDROCODONE AND TRAMADOL Contract SIGNED 03/24/15 [facility]  GIVEN ON 03/24/15 ---LOW RISK Last OV  03/31/15 Last Refill  TRAMADOL #60 WITH 0 REFILLS 03/31/15 AND HYDROCODONE #60 WITH 0 REFILLS ON 0 05/01/15   Please Advise

## 2015-06-01 NOTE — Telephone Encounter (Signed)
My instinct is to go ahead and fill these meds for a 30 day supply until he officially gets notified. What can we do to make sure he gets notified though

## 2015-06-02 MED ORDER — HYDROCODONE-ACETAMINOPHEN 5-325 MG PO TABS: 1.0000 | ORAL_TABLET | Freq: Four times a day (QID) | ORAL | Status: AC | PRN

## 2015-06-02 MED ORDER — TRAMADOL HCL 50 MG PO TABS: 50.0000 mg | ORAL_TABLET | Freq: Three times a day (TID) | ORAL | Status: AC | PRN

## 2015-06-02 NOTE — Telephone Encounter (Signed)
Printed prescriptions and on counter for signature. I did forward to you the phone note regarding dismissal letter.

## 2015-06-02 NOTE — Telephone Encounter (Signed)
So that is great but my question is what is the date we should use as his 30 day window? If we cannot prove what day he gets it? 06/02/15?

## 2015-06-02 NOTE — Telephone Encounter (Signed)
Called the patient informed (LEFT A DETAILED MESSAGE ON BOTH CELL AND HOME NUMBER) hardcopy for Tramadol and hyudrocodone are ready for pickup at the front desk.

## 2015-06-04 ENCOUNTER — Other Ambulatory Visit: Payer: Self-pay | Admitting: Family Medicine

## 2015-06-04 NOTE — Telephone Encounter (Signed)
Can send a refill on the 2 bp meds, 90 day supply of both no refills

## 2015-06-05 ENCOUNTER — Other Ambulatory Visit: Payer: Self-pay | Admitting: Family Medicine

## 2015-06-05 NOTE — Telephone Encounter (Signed)
Refill done as instructed by PCP

## 2015-06-18 ENCOUNTER — Other Ambulatory Visit: Payer: Self-pay | Admitting: Family Medicine

## 2015-07-24 ENCOUNTER — Ambulatory Visit (INDEPENDENT_AMBULATORY_CARE_PROVIDER_SITE_OTHER): Payer: BLUE CROSS/BLUE SHIELD | Admitting: Pharmacist

## 2015-07-24 DIAGNOSIS — Z5181 Encounter for therapeutic drug level monitoring: Secondary | ICD-10-CM | POA: Diagnosis not present

## 2015-07-24 DIAGNOSIS — Z7901 Long term (current) use of anticoagulants: Secondary | ICD-10-CM | POA: Diagnosis not present

## 2015-07-24 DIAGNOSIS — I4891 Unspecified atrial fibrillation: Secondary | ICD-10-CM | POA: Diagnosis not present

## 2015-07-24 LAB — POCT INR: INR: 2.3

## 2015-09-18 IMAGING — CR DG FINGER THUMB 2+V*L*
3 series · 3 of 3 positions shown · non-contrast
Comparison: None.

CLINICAL DATA: First digit pain

EXAM:
LEFT THUMB 2+V

[x finger pa left]
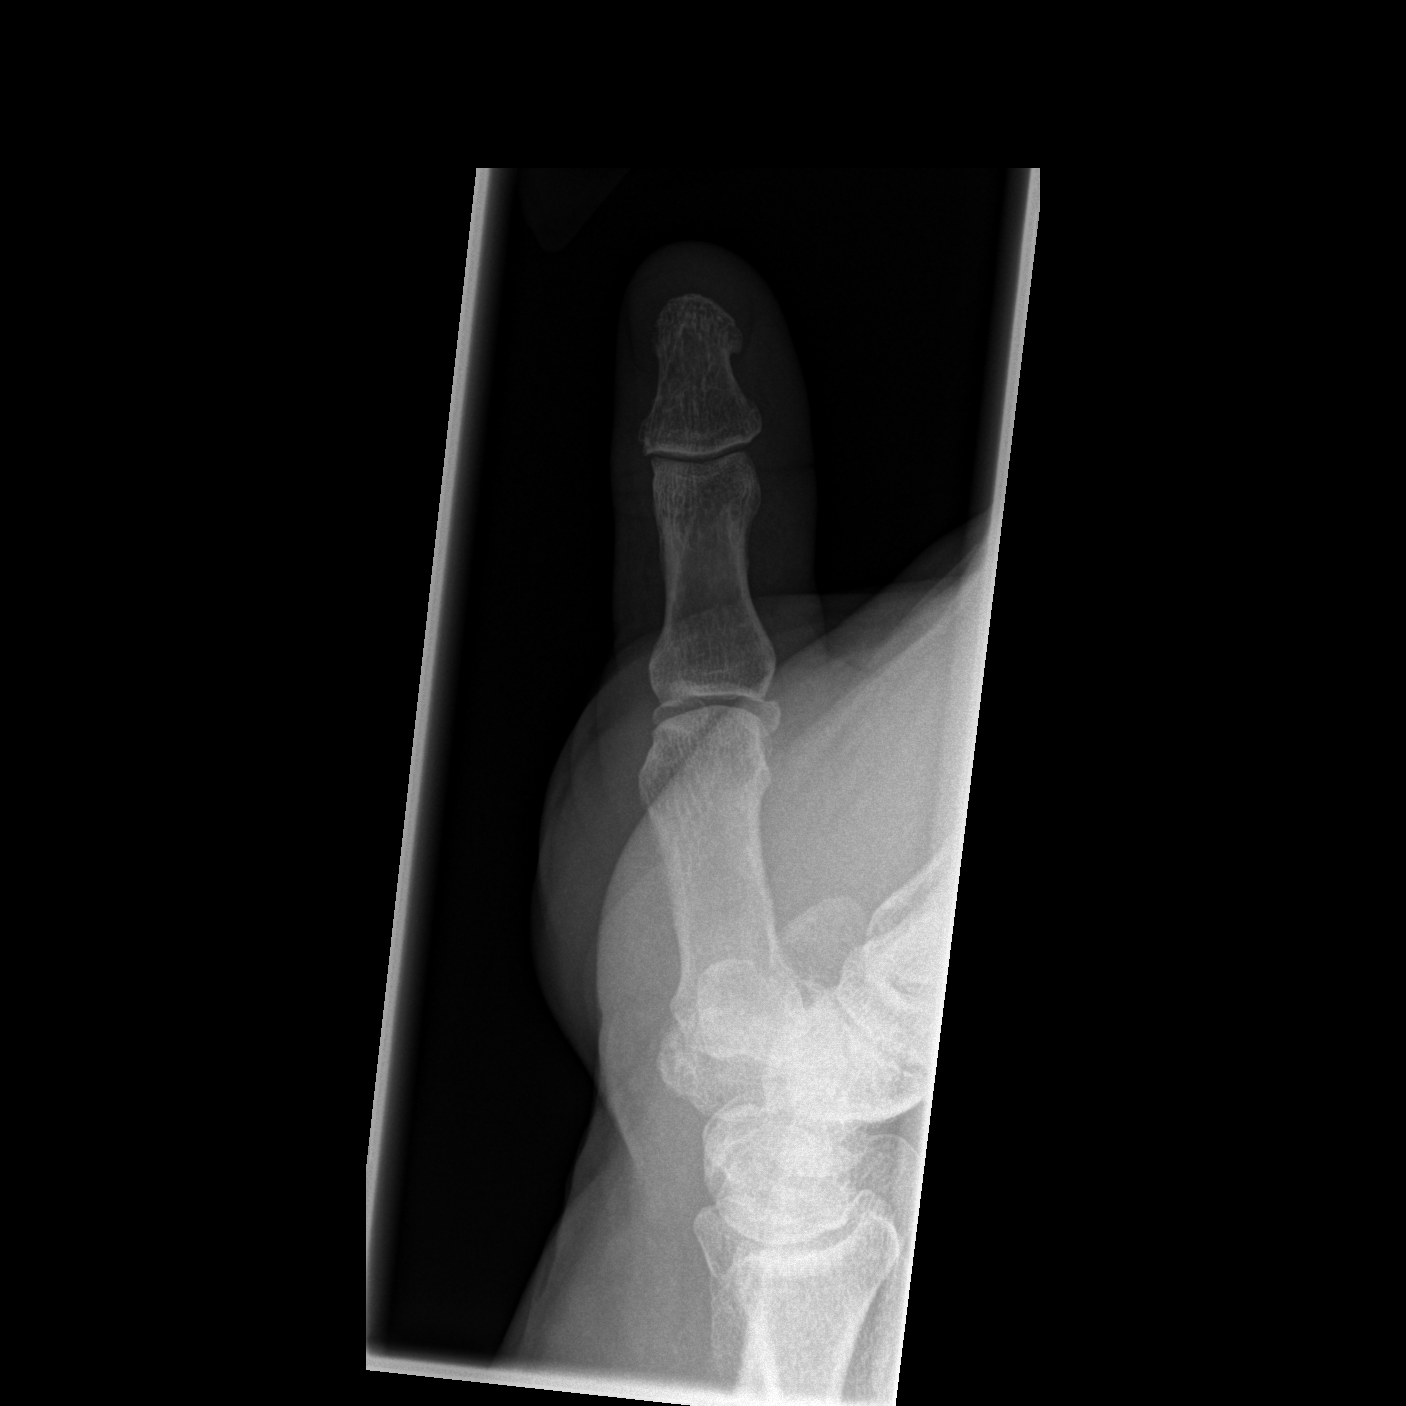

[x finger obl. left]
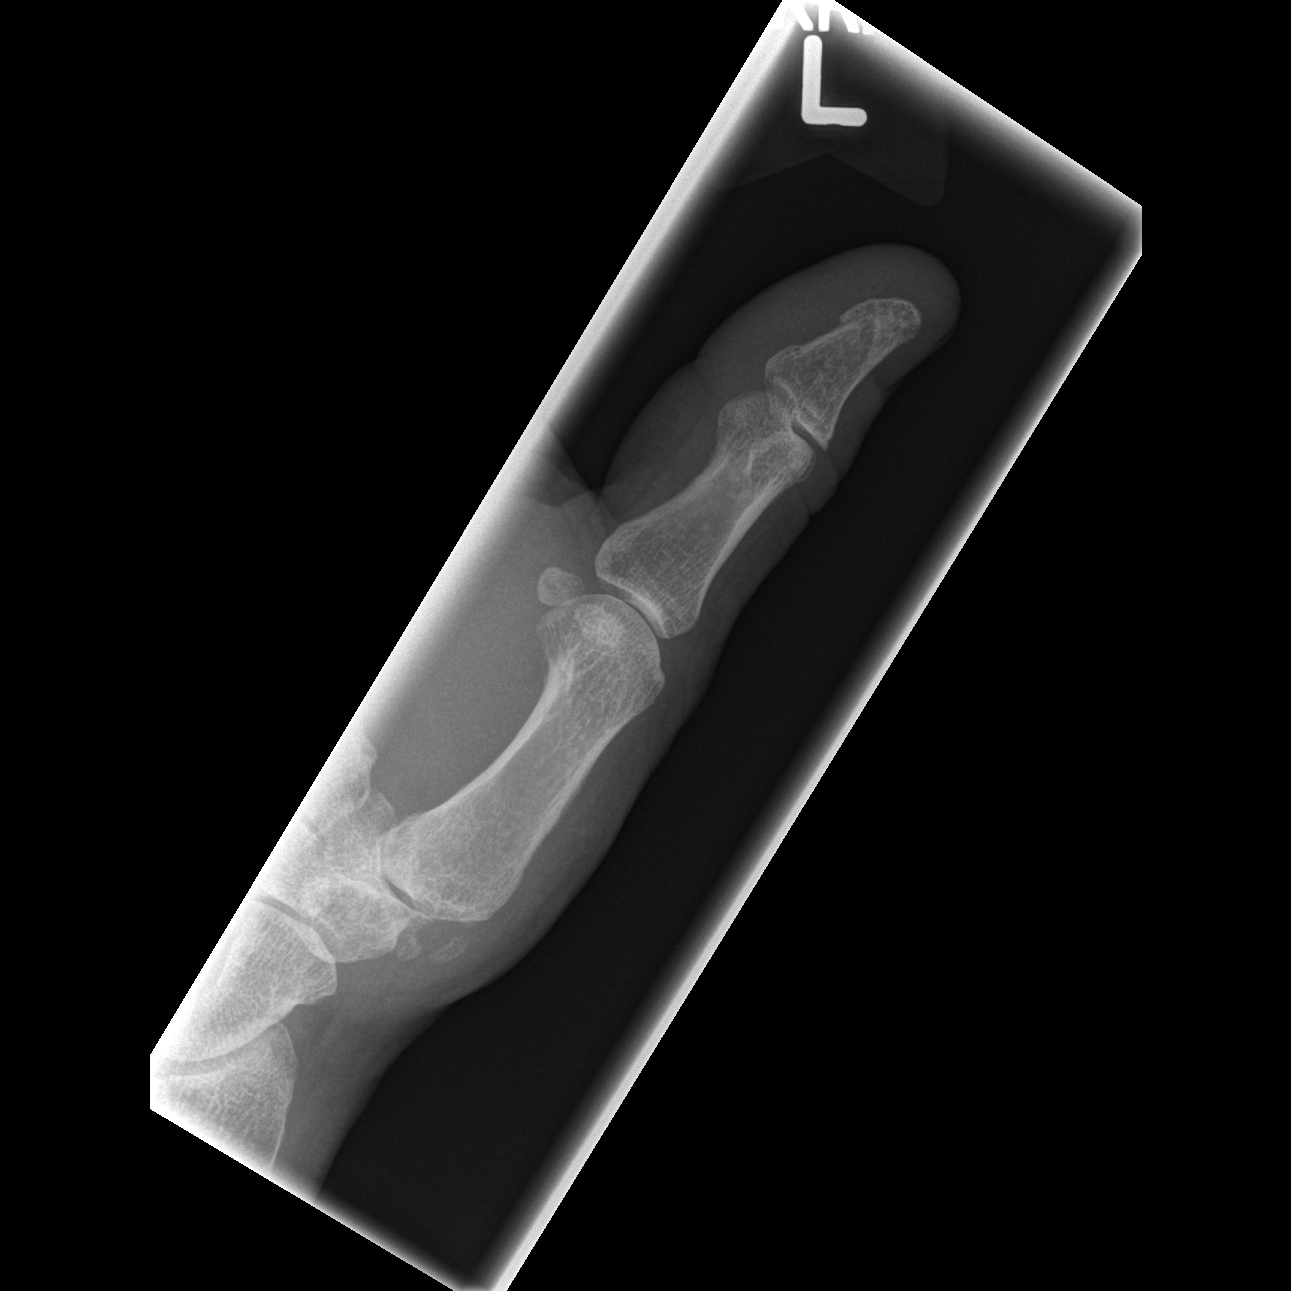

[x finger lateral left]
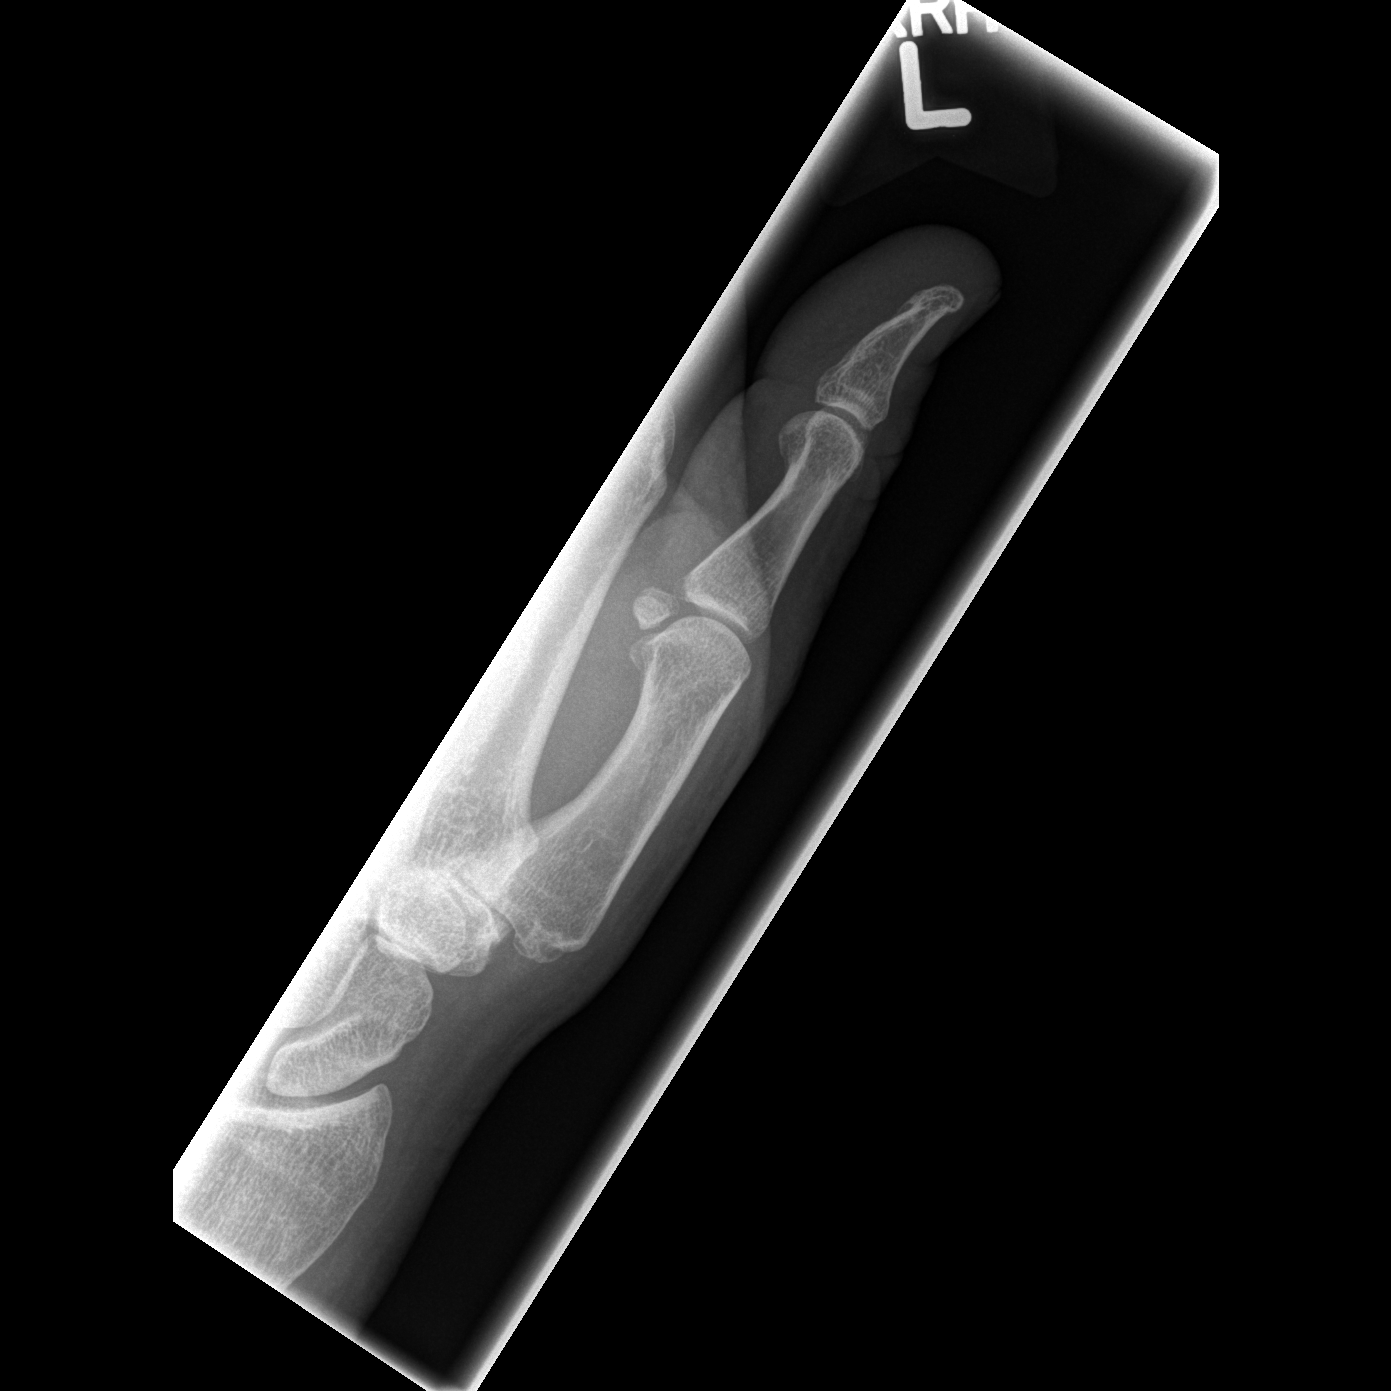

[3 of 3 positions shown; findings below may reference images not displayed]

FINDINGS: No acute fracture or dislocation is noted. Mild degenerative changes
of the first CMC joint are seen. Two small bony densities are noted
adjacent to the joint likely related to prior trauma with nonunion.
No gross soft tissue abnormality is seen.
IMPRESSION: Chronic changes without acute abnormality.

## 2015-10-19 ENCOUNTER — Other Ambulatory Visit: Payer: Self-pay | Admitting: Family Medicine

## 2015-11-11 ENCOUNTER — Other Ambulatory Visit: Payer: Self-pay | Admitting: Family Medicine

## 2015-11-20 ENCOUNTER — Other Ambulatory Visit: Payer: Self-pay | Admitting: Family Medicine

## 2015-12-07 ENCOUNTER — Other Ambulatory Visit: Payer: Self-pay | Admitting: Family Medicine

## 2016-02-20 ENCOUNTER — Other Ambulatory Visit: Payer: Self-pay | Admitting: Family Medicine

## 2017-06-10 ENCOUNTER — Encounter: Payer: Self-pay | Admitting: Gastroenterology

## 2021-04-22 DEATH — deceased
